# Patient Record
Sex: Female | Born: 1950 | Race: White | Hispanic: No | Marital: Married | Smoking: Former smoker
Health system: Southern US, Community
[De-identification: ages and names within clinical notes are randomized; demographics above are authoritative.]

## PROBLEM LIST (undated history)

## (undated) DIAGNOSIS — F329 Major depressive disorder, single episode, unspecified: Secondary | ICD-10-CM

## (undated) DIAGNOSIS — Z803 Family history of malignant neoplasm of breast: Secondary | ICD-10-CM

## (undated) DIAGNOSIS — Z923 Personal history of irradiation: Secondary | ICD-10-CM

## (undated) DIAGNOSIS — C801 Malignant (primary) neoplasm, unspecified: Secondary | ICD-10-CM

## (undated) DIAGNOSIS — K5090 Crohn's disease, unspecified, without complications: Secondary | ICD-10-CM

## (undated) DIAGNOSIS — E7800 Pure hypercholesterolemia, unspecified: Secondary | ICD-10-CM

## (undated) DIAGNOSIS — F32A Depression, unspecified: Secondary | ICD-10-CM

## (undated) HISTORY — DX: Family history of malignant neoplasm of breast: Z80.3

## (undated) HISTORY — DX: Crohn's disease, unspecified, without complications: K50.90

## (undated) HISTORY — PX: COLONOSCOPY: SHX174

## (undated) HISTORY — PX: OTHER SURGICAL HISTORY: SHX169

---

## 1999-02-08 ENCOUNTER — Encounter: Admission: RE | Admit: 1999-02-08 | Discharge: 1999-02-08 | Payer: Self-pay | Admitting: Obstetrics and Gynecology

## 1999-02-08 ENCOUNTER — Encounter: Payer: Self-pay | Admitting: Obstetrics and Gynecology

## 2000-02-07 ENCOUNTER — Other Ambulatory Visit: Admission: RE | Admit: 2000-02-07 | Discharge: 2000-02-07 | Payer: Self-pay | Admitting: Obstetrics and Gynecology

## 2000-10-29 ENCOUNTER — Encounter: Payer: Self-pay | Admitting: Obstetrics and Gynecology

## 2000-10-29 ENCOUNTER — Encounter: Admission: RE | Admit: 2000-10-29 | Discharge: 2000-10-29 | Payer: Self-pay | Admitting: Obstetrics and Gynecology

## 2013-03-10 ENCOUNTER — Other Ambulatory Visit: Payer: Self-pay | Admitting: Orthopedic Surgery

## 2013-03-10 NOTE — Progress Notes (Signed)
Surgery on 03/23/13.  proep on 03/21/13 at 100pm.  Need orders in EPIC.  Thank You.

## 2013-03-11 ENCOUNTER — Other Ambulatory Visit: Payer: Self-pay | Admitting: Orthopedic Surgery

## 2013-03-16 ENCOUNTER — Encounter (HOSPITAL_COMMUNITY): Payer: Self-pay | Admitting: Pharmacy Technician

## 2013-03-18 ENCOUNTER — Other Ambulatory Visit: Payer: Self-pay | Admitting: Orthopedic Surgery

## 2013-03-18 NOTE — H&P (Signed)
Holly Best is an 63 y.o. female.   Chief Complaint: back and leg pain HPI: The patient is a 63 year old female who presents today for follow up of their back. The patient is being followed for their low back symptoms. They are now 6 week(s) out from when symptoms began. Symptoms reported today include: pain. Current treatment includes: relative rest, activity modification, NSAIDs and pain medications. The following medication has been used for pain control: Percocet and naprosyn. The patient presents today following MRI.  No past medical history on file.  No past surgical history on file.  No family history on file. Social History:  has no tobacco, alcohol, and drug history on file.  Allergies: No Known Allergies   (Not in a hospital admission)  No results found for this or any previous visit (from the past 48 hour(s)). No results found.  Review of Systems  Constitutional: Negative.   HENT: Negative.   Eyes: Negative.   Respiratory: Negative.   Cardiovascular: Negative.   Gastrointestinal: Negative.   Genitourinary: Negative.   Musculoskeletal: Positive for back pain.  Skin: Negative.   Neurological: Negative.   Psychiatric/Behavioral: Negative.     There were no vitals taken for this visit. Physical Exam  Constitutional: She is oriented to person, place, and time. She appears well-developed and well-nourished.  HENT:  Head: Normocephalic and atraumatic.  Eyes: Conjunctivae and EOM are normal. Pupils are equal, round, and reactive to light.  Neck: Normal range of motion. Neck supple.  Cardiovascular: Normal rate and regular rhythm.   Respiratory: Effort normal and breath sounds normal.  GI: Soft. Bowel sounds are normal.  Musculoskeletal:  On exam, she's in moderate distress. Mood and affect appropriate. SLR produces buttock and thigh pain on the left, negative on the right. Trace EHL weakness is noted. Pain with extension and flexion of the lumbar  spine.  Lumbar spine exam reveals no evidence of soft tissue swelling, ecchymosis or deformity. The abdomen is soft and nontender. Nontender over the trochanters. No cellulitis or lymphadenopathy.  Motor is 5/5 including tibialis anterior, plantar flexion, quadriceps and hamstrings. Patient is normoreflexic. There is no Babinski or clonus. Sensory exam is intact to light touch. The patient has good distal pulses. No DVT. No pain and normal range of motion without instability of the hips, knees and ankles.  Neurological: She is alert and oriented to person, place, and time. She has normal reflexes.  Skin: Skin is warm and dry.  Psychiatric: She has a normal mood and affect.    MRI demonstrates lateral recess stenosis at 4-5 secondary to facet hypertrophy, a small disc protrusion centrally at 3-4 and at 2-3.  X-rays of the lumbar spine show minimal scoliosis. Hips are unremarkable.  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  The patient and her husband called in. She was having a lot of pain down her left leg and into the top of the foot and weakness. They want to forego the epidural and proceed with decompression. I re-reviewed her MRI. She does have facet hypertrophy and significant lateral recess stenosis at 4-5 effecting the 5 root. She is weak on exam with neurotension signs. It is reasonable to proceed and forego that. We discussed lumbar decompression. She will stop by the office for a handout that discusses the risks and benefits, she will review that. She will remain out of work. We discussed this for buttock and  leg pain only, not back pain and she understands. We had a good discussion concerning this. We will forego epidural and proceed with decompression.  I had an extensive discussion of the risks and benefits of the lumbar decompression with the  patient including bleeding, infection, damage to neurovascular structures, epidural fibrosis, CSF leak requiring repair. We also discussed increase in pain, adjacent segment disease, recurrent disc herniation, need for future surgery including repeat decompression and/or fusion. We also discussed risks of postoperative hematoma, paralysis, anesthetic complications including DVT, PE, death, cardiopulmonary dysfunction. In addition, the perioperative and postoperative courses were discussed in detail including the rehabilitative time and return to functional activity and work. I provided the patient with an illustrated handout and utilized the appropriate surgical models.   Plan microlumbar decompression L4-5 left  BISSELL, JACLYN M.  For Dr. Tonita Cong  03/18/2013, 2:45 PM

## 2013-03-21 ENCOUNTER — Ambulatory Visit (HOSPITAL_COMMUNITY)
Admission: RE | Admit: 2013-03-21 | Discharge: 2013-03-21 | Disposition: A | Discharge status: Home / Self Care | Payer: Federal, State, Local not specified - PPO | Source: Ambulatory Visit | Attending: Orthopedic Surgery | Admitting: Orthopedic Surgery

## 2013-03-21 ENCOUNTER — Encounter (HOSPITAL_COMMUNITY): Payer: Self-pay

## 2013-03-21 ENCOUNTER — Encounter (HOSPITAL_COMMUNITY)
Admission: RE | Admit: 2013-03-21 | Discharge: 2013-03-21 | Disposition: A | Discharge status: Home / Self Care | Payer: Federal, State, Local not specified - PPO | Source: Ambulatory Visit | Attending: Specialist | Admitting: Specialist

## 2013-03-21 DIAGNOSIS — M4120 Other idiopathic scoliosis, site unspecified: Secondary | ICD-10-CM | POA: Insufficient documentation

## 2013-03-21 DIAGNOSIS — Z01818 Encounter for other preprocedural examination: Secondary | ICD-10-CM | POA: Insufficient documentation

## 2013-03-21 DIAGNOSIS — M51379 Other intervertebral disc degeneration, lumbosacral region without mention of lumbar back pain or lower extremity pain: Secondary | ICD-10-CM | POA: Insufficient documentation

## 2013-03-21 DIAGNOSIS — M5137 Other intervertebral disc degeneration, lumbosacral region: Secondary | ICD-10-CM | POA: Insufficient documentation

## 2013-03-21 DIAGNOSIS — Z01812 Encounter for preprocedural laboratory examination: Secondary | ICD-10-CM | POA: Insufficient documentation

## 2013-03-21 LAB — BASIC METABOLIC PANEL
BUN: 15 mg/dL (ref 6–23)
CALCIUM: 9.4 mg/dL (ref 8.4–10.5)
CO2: 26 mEq/L (ref 19–32)
Chloride: 102 mEq/L (ref 96–112)
Creatinine, Ser: 0.81 mg/dL (ref 0.50–1.10)
GFR, EST AFRICAN AMERICAN: 88 mL/min — AB (ref >90)
GFR, EST NON AFRICAN AMERICAN: 76 mL/min — AB (ref >90)
Glucose, Bld: 105 mg/dL — ABNORMAL HIGH (ref 70–99)
Potassium: 4.4 mEq/L (ref 3.7–5.3)
SODIUM: 139 meq/L (ref 137–147)

## 2013-03-21 LAB — CBC
HCT: 37.8 % (ref 36.0–46.0)
Hemoglobin: 12.6 g/dL (ref 12.0–15.0)
MCH: 31.3 pg (ref 26.0–34.0)
MCHC: 33.3 g/dL (ref 30.0–36.0)
MCV: 93.8 fL (ref 78.0–100.0)
PLATELETS: 306 10*3/uL (ref 150–400)
RBC: 4.03 MIL/uL (ref 3.87–5.11)
RDW: 12.9 % (ref 11.5–15.5)
WBC: 5.7 10*3/uL (ref 4.0–10.5)

## 2013-03-21 LAB — SURGICAL PCR SCREEN
MRSA, PCR: NEGATIVE
STAPHYLOCOCCUS AUREUS: POSITIVE — AB

## 2013-03-21 NOTE — Patient Instructions (Signed)
20     Your procedure is scheduled on:  Wednesday 03/23/2013  Report to [facility] at 0830 AM.  Call this number if you have problems the night before or morning of surgery:  [phone]   Remember:             IF YOU USE CPAP,BRING MASK AND TUBING AM OF SURGERY!             IF YOU DO NOT HAVE YOUR TYPE AND SCREEN DRAWN AT PRE-ADMIT APPOINTMENT, YOU WILL HAVE IT DRAWN AM OF SURGERY!   Do not eat food or drink liquids AFTER MIDNIGHT!  Take these medicines the morning of surgery with A SIP OF WATER: Paxil    [facility] IS NOT RESPONSIBLE FOR ANY BELONGINGS OR VALUABLES BROUGHT TO HOSPITAL.  Marland Kitchen  Leave suitcase in the car. After surgery it may be brought to your room.  For patients admitted to the hospital, checkout time is 11:00 AM the day of              Discharge.    DO NOT WEAR JEWELRY,MAKE-UP,LOTIONS,POWDERS,PERFUMES,CONTACTS , DENTURES OR BRIDGEWORK ,AND DO NOT WEAR FALSE EYELASHES                                    Patients discharged the day of surgery will not be allowed to drive home. If going home the same day of surgery, must have someone stay with you  first 24 hrs.at home and arrange for someone to drive you home from the [place] IS:N/A   Special Instructions:              Please read over the following fact sheets that you were given:             1. [place].Cande Mastropietro,RN,BSN     [phone]                FAILURE TO FOLLOW THESE INSTRUCTIONS MAY RESULT IN   CANCELLATION OF YOUR SURGERY!               Patient Signature:___________________________

## 2013-03-23 ENCOUNTER — Ambulatory Visit (HOSPITAL_COMMUNITY): Payer: Federal, State, Local not specified - PPO

## 2013-03-23 ENCOUNTER — Encounter (HOSPITAL_COMMUNITY): Payer: Self-pay | Admitting: *Deleted

## 2013-03-23 ENCOUNTER — Ambulatory Visit (HOSPITAL_COMMUNITY)
Admission: RE | Admit: 2013-03-23 | Discharge: 2013-03-24 | Disposition: A | Discharge status: Home / Self Care | Payer: Federal, State, Local not specified - PPO | Source: Ambulatory Visit | Attending: Specialist | Admitting: Specialist

## 2013-03-23 ENCOUNTER — Encounter (HOSPITAL_COMMUNITY): Payer: Federal, State, Local not specified - PPO | Admitting: Certified Registered Nurse Anesthetist

## 2013-03-23 ENCOUNTER — Encounter (HOSPITAL_COMMUNITY)
Admission: RE | Disposition: A | Discharge status: Home / Self Care | Payer: Self-pay | Source: Ambulatory Visit | Attending: Specialist

## 2013-03-23 ENCOUNTER — Ambulatory Visit (HOSPITAL_COMMUNITY): Payer: Federal, State, Local not specified - PPO | Admitting: Certified Registered Nurse Anesthetist

## 2013-03-23 DIAGNOSIS — M48062 Spinal stenosis, lumbar region with neurogenic claudication: Secondary | ICD-10-CM | POA: Insufficient documentation

## 2013-03-23 DIAGNOSIS — Z79899 Other long term (current) drug therapy: Secondary | ICD-10-CM | POA: Insufficient documentation

## 2013-03-23 DIAGNOSIS — E7800 Pure hypercholesterolemia, unspecified: Secondary | ICD-10-CM | POA: Insufficient documentation

## 2013-03-23 DIAGNOSIS — Z791 Long term (current) use of non-steroidal anti-inflammatories (NSAID): Secondary | ICD-10-CM | POA: Insufficient documentation

## 2013-03-23 DIAGNOSIS — Z87891 Personal history of nicotine dependence: Secondary | ICD-10-CM | POA: Insufficient documentation

## 2013-03-23 HISTORY — DX: Depression, unspecified: F32.A

## 2013-03-23 HISTORY — PX: LUMBAR LAMINECTOMY: SHX95

## 2013-03-23 HISTORY — DX: Major depressive disorder, single episode, unspecified: F32.9

## 2013-03-23 HISTORY — DX: Pure hypercholesterolemia, unspecified: E78.00

## 2013-03-23 SURGERY — MICRODISCECTOMY LUMBAR LAMINECTOMY
Anesthesia: General | Site: Back | Laterality: Bilateral

## 2013-03-23 MED ORDER — HYDROCODONE-ACETAMINOPHEN 5-325 MG PO TABS: 1.0000 | ORAL_TABLET | ORAL | Status: DC | PRN

## 2013-03-23 MED ORDER — DOCUSATE SODIUM 100 MG PO CAPS
100.0000 mg | ORAL_CAPSULE | Freq: Two times a day (BID) | ORAL | Status: DC
  Administered 2013-03-23 – 2013-03-24 (×2): 100 mg via ORAL

## 2013-03-23 MED ORDER — HYDROMORPHONE HCL PF 1 MG/ML IJ SOLN: 0.2500 mg | INTRAMUSCULAR | Status: DC | PRN

## 2013-03-23 MED ORDER — LACTATED RINGERS IV SOLN: INTRAVENOUS | Status: DC

## 2013-03-23 MED ORDER — GLYCOPYRROLATE 0.2 MG/ML IJ SOLN
INTRAMUSCULAR | Status: AC
  Filled 2013-03-23: qty 3

## 2013-03-23 MED ORDER — SODIUM CHLORIDE 0.9 % IJ SOLN: 3.0000 mL | INTRAMUSCULAR | Status: DC | PRN

## 2013-03-23 MED ORDER — PHENYLEPHRINE 40 MCG/ML (10ML) SYRINGE FOR IV PUSH (FOR BLOOD PRESSURE SUPPORT)
PREFILLED_SYRINGE | INTRAVENOUS | Status: AC
  Filled 2013-03-23: qty 10

## 2013-03-23 MED ORDER — PAROXETINE HCL 20 MG PO TABS
20.0000 mg | ORAL_TABLET | Freq: Every evening | ORAL | Status: DC
  Administered 2013-03-23: 20 mg via ORAL
  Filled 2013-03-23 (×2): qty 1

## 2013-03-23 MED ORDER — ACETAMINOPHEN 650 MG RE SUPP: 650.0000 mg | RECTAL | Status: DC | PRN

## 2013-03-23 MED ORDER — PHENOL 1.4 % MT LIQD: 1.0000 | OROMUCOSAL | Status: DC | PRN

## 2013-03-23 MED ORDER — FENTANYL CITRATE 0.05 MG/ML IJ SOLN
INTRAMUSCULAR | Status: AC
  Filled 2013-03-23: qty 5

## 2013-03-23 MED ORDER — MIDAZOLAM HCL 5 MG/5ML IJ SOLN
INTRAMUSCULAR | Status: DC | PRN
  Administered 2013-03-23: 2 mg via INTRAVENOUS

## 2013-03-23 MED ORDER — ROCURONIUM BROMIDE 100 MG/10ML IV SOLN
INTRAVENOUS | Status: DC | PRN
  Administered 2013-03-23: 40 mg via INTRAVENOUS

## 2013-03-23 MED ORDER — ACETAMINOPHEN 10 MG/ML IV SOLN
1000.0000 mg | Freq: Once | INTRAVENOUS | Status: AC
  Administered 2013-03-23: 1000 mg via INTRAVENOUS
  Filled 2013-03-23: qty 100

## 2013-03-23 MED ORDER — ACETAMINOPHEN 325 MG PO TABS: 650.0000 mg | ORAL_TABLET | ORAL | Status: DC | PRN

## 2013-03-23 MED ORDER — METHOCARBAMOL 500 MG PO TABS
500.0000 mg | ORAL_TABLET | Freq: Four times a day (QID) | ORAL | Status: DC | PRN
Start: 2013-03-23 — End: 2013-03-24
  Administered 2013-03-23 – 2013-03-24 (×3): 500 mg via ORAL
  Filled 2013-03-23 (×3): qty 1

## 2013-03-23 MED ORDER — LACTATED RINGERS IV SOLN
INTRAVENOUS | Status: DC
  Administered 2013-03-23 (×3): via INTRAVENOUS

## 2013-03-23 MED ORDER — OXYCODONE-ACETAMINOPHEN 5-325 MG PO TABS
1.0000 | ORAL_TABLET | ORAL | Status: DC | PRN
  Administered 2013-03-23 (×2): 2 via ORAL
  Administered 2013-03-23: 1 via ORAL
  Administered 2013-03-24 (×2): 2 via ORAL
  Filled 2013-03-23: qty 1
  Filled 2013-03-23 (×4): qty 2

## 2013-03-23 MED ORDER — HYDROMORPHONE HCL PF 1 MG/ML IJ SOLN
0.5000 mg | INTRAMUSCULAR | Status: DC | PRN
  Administered 2013-03-23 – 2013-03-24 (×2): 1 mg via INTRAVENOUS
  Filled 2013-03-23 (×2): qty 1

## 2013-03-23 MED ORDER — LORAZEPAM 1 MG PO TABS
1.0000 mg | ORAL_TABLET | Freq: Every day | ORAL | Status: DC
  Administered 2013-03-23: 1 mg via ORAL
  Filled 2013-03-23: qty 1

## 2013-03-23 MED ORDER — PROMETHAZINE HCL 25 MG/ML IJ SOLN: 6.2500 mg | INTRAMUSCULAR | Status: DC | PRN

## 2013-03-23 MED ORDER — OXYCODONE-ACETAMINOPHEN 7.5-325 MG PO TABS: 1.0000 | ORAL_TABLET | ORAL | Status: DC | PRN

## 2013-03-23 MED ORDER — ONDANSETRON HCL 4 MG/2ML IJ SOLN
INTRAMUSCULAR | Status: DC | PRN
  Administered 2013-03-23: 4 mg via INTRAVENOUS

## 2013-03-23 MED ORDER — EPHEDRINE SULFATE 50 MG/ML IJ SOLN
INTRAMUSCULAR | Status: DC | PRN
  Administered 2013-03-23 (×4): 5 mg via INTRAVENOUS

## 2013-03-23 MED ORDER — SODIUM CHLORIDE 0.9 % IR SOLN
Status: DC | PRN
  Administered 2013-03-23: 12:00:00

## 2013-03-23 MED ORDER — BUPIVACAINE-EPINEPHRINE 0.5% -1:200000 IJ SOLN
INTRAMUSCULAR | Status: DC | PRN
  Administered 2013-03-23: 13 mL

## 2013-03-23 MED ORDER — THROMBIN 5000 UNITS EX SOLR
OROMUCOSAL | Status: DC | PRN
  Administered 2013-03-23: 12:00:00 via TOPICAL

## 2013-03-23 MED ORDER — LIDOCAINE HCL (CARDIAC) 20 MG/ML IV SOLN
INTRAVENOUS | Status: DC | PRN
  Administered 2013-03-23: 70 mg via INTRAVENOUS

## 2013-03-23 MED ORDER — SODIUM CHLORIDE 0.9 % IJ SOLN
INTRAMUSCULAR | Status: AC
  Filled 2013-03-23: qty 10

## 2013-03-23 MED ORDER — EZETIMIBE 10 MG PO TABS
10.0000 mg | ORAL_TABLET | Freq: Every day | ORAL | Status: DC
  Administered 2013-03-23: 10 mg via ORAL
  Filled 2013-03-23 (×2): qty 1

## 2013-03-23 MED ORDER — HYDROMORPHONE HCL PF 1 MG/ML IJ SOLN
INTRAMUSCULAR | Status: DC | PRN
  Administered 2013-03-23 (×2): 0.5 mg via INTRAVENOUS

## 2013-03-23 MED ORDER — ONDANSETRON HCL 4 MG/2ML IJ SOLN: 4.0000 mg | INTRAMUSCULAR | Status: DC | PRN

## 2013-03-23 MED ORDER — CEFAZOLIN SODIUM-DEXTROSE 2-3 GM-% IV SOLR
2.0000 g | INTRAVENOUS | Status: AC
  Administered 2013-03-23: 2 g via INTRAVENOUS

## 2013-03-23 MED ORDER — PROPOFOL 10 MG/ML IV BOLUS
INTRAVENOUS | Status: AC
  Filled 2013-03-23: qty 20

## 2013-03-23 MED ORDER — ESMOLOL HCL 10 MG/ML IV SOLN
INTRAVENOUS | Status: DC | PRN
  Administered 2013-03-23: 30 mg via INTRAVENOUS

## 2013-03-23 MED ORDER — CEFAZOLIN SODIUM-DEXTROSE 2-3 GM-% IV SOLR
2.0000 g | Freq: Three times a day (TID) | INTRAVENOUS | Status: AC
  Administered 2013-03-23 – 2013-03-24 (×2): 2 g via INTRAVENOUS
  Filled 2013-03-23 (×2): qty 50

## 2013-03-23 MED ORDER — PROPOFOL 10 MG/ML IV BOLUS
INTRAVENOUS | Status: DC | PRN
  Administered 2013-03-23: 170 mg via INTRAVENOUS

## 2013-03-23 MED ORDER — ROCURONIUM BROMIDE 100 MG/10ML IV SOLN
INTRAVENOUS | Status: AC
  Filled 2013-03-23: qty 1

## 2013-03-23 MED ORDER — LIDOCAINE HCL (CARDIAC) 20 MG/ML IV SOLN
INTRAVENOUS | Status: AC
  Filled 2013-03-23: qty 5

## 2013-03-23 MED ORDER — MEPERIDINE HCL 50 MG/ML IJ SOLN: 6.2500 mg | INTRAMUSCULAR | Status: DC | PRN

## 2013-03-23 MED ORDER — SODIUM CHLORIDE 0.9 % IJ SOLN: 3.0000 mL | Freq: Two times a day (BID) | INTRAMUSCULAR | Status: DC

## 2013-03-23 MED ORDER — THROMBIN 5000 UNITS EX SOLR
CUTANEOUS | Status: AC
  Filled 2013-03-23: qty 10000

## 2013-03-23 MED ORDER — NEOSTIGMINE METHYLSULFATE 1 MG/ML IJ SOLN
INTRAMUSCULAR | Status: AC
  Filled 2013-03-23: qty 10

## 2013-03-23 MED ORDER — LACTATED RINGERS IV SOLN
INTRAVENOUS | Status: DC
  Administered 2013-03-23: 1000 mL via INTRAVENOUS

## 2013-03-23 MED ORDER — FENTANYL CITRATE 0.05 MG/ML IJ SOLN
INTRAMUSCULAR | Status: DC | PRN
  Administered 2013-03-23: 100 ug via INTRAVENOUS
  Administered 2013-03-23 (×3): 50 ug via INTRAVENOUS

## 2013-03-23 MED ORDER — MIDAZOLAM HCL 2 MG/2ML IJ SOLN
INTRAMUSCULAR | Status: AC
  Filled 2013-03-23: qty 2

## 2013-03-23 MED ORDER — NEOSTIGMINE METHYLSULFATE 1 MG/ML IJ SOLN
INTRAMUSCULAR | Status: DC | PRN
  Administered 2013-03-23: 4 mg via INTRAVENOUS

## 2013-03-23 MED ORDER — SODIUM CHLORIDE 0.45 % IV SOLN
INTRAVENOUS | Status: DC
  Administered 2013-03-23: 50 mL/h via INTRAVENOUS

## 2013-03-23 MED ORDER — THROMBIN 5000 UNITS EX SOLR
CUTANEOUS | Status: DC | PRN
  Administered 2013-03-23: 12:00:00 via TOPICAL

## 2013-03-23 MED ORDER — METHOCARBAMOL 500 MG PO TABS: 500.0000 mg | ORAL_TABLET | Freq: Three times a day (TID) | ORAL | Status: DC

## 2013-03-23 MED ORDER — MENTHOL 3 MG MT LOZG
1.0000 | LOZENGE | OROMUCOSAL | Status: DC | PRN
  Filled 2013-03-23 (×2): qty 9

## 2013-03-23 MED ORDER — CEFAZOLIN SODIUM-DEXTROSE 2-3 GM-% IV SOLR
INTRAVENOUS | Status: AC
  Filled 2013-03-23: qty 50

## 2013-03-23 MED ORDER — SODIUM CHLORIDE 0.9 % IV SOLN: 250.0000 mL | INTRAVENOUS | Status: DC

## 2013-03-23 MED ORDER — GLYCOPYRROLATE 0.2 MG/ML IJ SOLN
INTRAMUSCULAR | Status: DC | PRN
  Administered 2013-03-23: 0.6 mg via INTRAVENOUS

## 2013-03-23 MED ORDER — HYDROMORPHONE HCL PF 2 MG/ML IJ SOLN
INTRAMUSCULAR | Status: AC
  Filled 2013-03-23: qty 1

## 2013-03-23 MED ORDER — EPHEDRINE SULFATE 50 MG/ML IJ SOLN
INTRAMUSCULAR | Status: AC
  Filled 2013-03-23: qty 1

## 2013-03-23 MED ORDER — ONDANSETRON HCL 4 MG/2ML IJ SOLN
INTRAMUSCULAR | Status: AC
  Filled 2013-03-23: qty 2

## 2013-03-23 MED ORDER — BUPIVACAINE-EPINEPHRINE PF 0.5-1:200000 % IJ SOLN
INTRAMUSCULAR | Status: AC
  Filled 2013-03-23: qty 30

## 2013-03-23 MED ORDER — METHOCARBAMOL 100 MG/ML IJ SOLN
500.0000 mg | Freq: Four times a day (QID) | INTRAVENOUS | Status: DC | PRN
  Administered 2013-03-23: 500 mg via INTRAVENOUS
  Filled 2013-03-23: qty 5

## 2013-03-23 MED ORDER — DEXAMETHASONE SODIUM PHOSPHATE 10 MG/ML IJ SOLN
INTRAMUSCULAR | Status: DC | PRN
  Administered 2013-03-23: 10 mg via INTRAVENOUS

## 2013-03-23 MED ORDER — ESMOLOL HCL 10 MG/ML IV SOLN
INTRAVENOUS | Status: AC
  Filled 2013-03-23: qty 10

## 2013-03-23 SURGICAL SUPPLY — 46 items
BAG SPEC THK2 15X12 ZIP CLS (MISCELLANEOUS) ×1
BAG ZIPLOCK 12X15 (MISCELLANEOUS) ×3 IMPLANT
CHLORAPREP W/TINT 26ML (MISCELLANEOUS) IMPLANT
CLEANER TIP ELECTROSURG 2X2 (MISCELLANEOUS) ×3 IMPLANT
CLOSURE WOUND 1/2 X4 (GAUZE/BANDAGES/DRESSINGS) ×1
CLOTH 2% CHLOROHEXIDINE 3PK (PERSONAL CARE ITEMS) ×3 IMPLANT
DRAPE MICROSCOPE LEICA (MISCELLANEOUS) ×3 IMPLANT
DRAPE POUCH INSTRU U-SHP 10X18 (DRAPES) ×3 IMPLANT
DRAPE SURG 17X11 SM STRL (DRAPES) ×3 IMPLANT
DRAPE UTILITY XL STRL (DRAPES) ×3 IMPLANT
DRSG AQUACEL AG ADV 3.5X 4 (GAUZE/BANDAGES/DRESSINGS) ×2 IMPLANT
DRSG AQUACEL AG ADV 3.5X 6 (GAUZE/BANDAGES/DRESSINGS) IMPLANT
DURAPREP 26ML APPLICATOR (WOUND CARE) ×3 IMPLANT
DURASEAL SPINE SEALANT 3ML (MISCELLANEOUS) ×3 IMPLANT
ELECT REM PT RETURN 9FT ADLT (ELECTROSURGICAL) ×3
ELECTRODE REM PT RTRN 9FT ADLT (ELECTROSURGICAL) ×1 IMPLANT
GLOVE BIOGEL PI IND STRL 7.5 (GLOVE) ×1 IMPLANT
GLOVE BIOGEL PI INDICATOR 7.5 (GLOVE) ×2
GLOVE SURG SS PI 7.5 STRL IVOR (GLOVE) ×3 IMPLANT
GLOVE SURG SS PI 8.0 STRL IVOR (GLOVE) ×6 IMPLANT
GOWN STRL REUS W/TWL XL LVL3 (GOWN DISPOSABLE) ×9 IMPLANT
IV CATH 14GX2 1/4 (CATHETERS) ×1 IMPLANT
KIT BASIN OR (CUSTOM PROCEDURE TRAY) ×3 IMPLANT
KIT POSITIONING SURG ANDREWS (MISCELLANEOUS) ×3 IMPLANT
MANIFOLD NEPTUNE II (INSTRUMENTS) ×3 IMPLANT
NDL SPNL 18GX3.5 QUINCKE PK (NEEDLE) ×3 IMPLANT
NEEDLE SPNL 18GX3.5 QUINCKE PK (NEEDLE) ×6 IMPLANT
PATTIES SURGICAL .5 X.5 (GAUZE/BANDAGES/DRESSINGS) IMPLANT
PATTIES SURGICAL .75X.75 (GAUZE/BANDAGES/DRESSINGS) IMPLANT
PATTIES SURGICAL 1X1 (DISPOSABLE) IMPLANT
SPONGE SURGIFOAM ABS GEL 100 (HEMOSTASIS) ×3 IMPLANT
STAPLER VISISTAT (STAPLE) IMPLANT
STRIP CLOSURE SKIN 1/2X4 (GAUZE/BANDAGES/DRESSINGS) ×1 IMPLANT
SUT NURALON 4 0 TR CR/8 (SUTURE) ×1 IMPLANT
SUT PROLENE 3 0 PS 2 (SUTURE) ×2 IMPLANT
SUT VIC AB 0 CT1 27 (SUTURE)
SUT VIC AB 0 CT1 27XBRD ANTBC (SUTURE) IMPLANT
SUT VIC AB 1-0 CT2 27 (SUTURE) ×4 IMPLANT
SUT VIC AB 2-0 CT1 27 (SUTURE)
SUT VIC AB 2-0 CT1 TAPERPNT 27 (SUTURE) ×1 IMPLANT
SUT VICRYL 0 UR6 27IN ABS (SUTURE) IMPLANT
SYR 3ML LL SCALE MARK (SYRINGE) ×1 IMPLANT
SYR CONTROL 10ML LL (SYRINGE) IMPLANT
TRAY FOLEY CATH 14FRSI W/METER (CATHETERS) ×2 IMPLANT
TRAY LAMINECTOMY (CUSTOM PROCEDURE TRAY) ×3 IMPLANT
YANKAUER SUCT BULB TIP NO VENT (SUCTIONS) ×3 IMPLANT

## 2013-03-23 NOTE — Brief Op Note (Signed)
03/23/2013  12:25 PM  PATIENT:  Holly Best  63 y.o. 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  PROCEDURE:  Procedure(s): MICRO LUMBAR DECOMPRESSION, FORAMINOTOMY L4-5 (Bilateral)  SURGEON:  Surgeon(s) and Role:    * Johnn Hai, MD - Primary  PHYSICIAN ASSISTANT:   ASSISTANTS: Bissell   ANESTHESIA:   general  EBL:  Total I/O In: 1000 [I.V.:1000] Out: 500 [Urine:450; Blood:50]  BLOOD ADMINISTERED:none  DRAINS: none   LOCAL MEDICATIONS USED:  MARCAINE     SPECIMEN:  No Specimen  DISPOSITION OF SPECIMEN:  N/A  COUNTS:  YES  TOURNIQUET:  * No tourniquets in log *  DICTATION: .Other Dictation: Dictation Number [id number]  PLAN OF CARE: Admit for overnight observation  PATIENT DISPOSITION:  PACU - hemodynamically stable.   Delay start of Pharmacological VTE agent (>24hrs) due to surgical blood loss or risk of bleeding: no

## 2013-03-23 NOTE — Interval H&P Note (Signed)
History and Physical Interval Note:  03/23/2013 10:15 AM  Holly Best  has presented today for surgery, with the diagnosis of H&P L4-5 LEFT  The various methods of treatment have been discussed with the patient and family. After consideration of risks, benefits and other options for treatment, the patient has consented to  Procedure(s): MICRO LUMBER DECOMPRESSION L4-5 ON LEFT (Left) as a surgical intervention .  The patient's history has been reviewed, patient examined, no change in status, stable for surgery.  I have reviewed the patient's chart and labs.  Questions were answered to the patient's satisfaction.     [name] C

## 2013-03-23 NOTE — Preoperative (Signed)
Beta Blockers   Reason not to administer Beta Blockers:Not Applicable 

## 2013-03-23 NOTE — Op Note (Signed)
NAMEAIGNER, HORSEMAN NO.:  [id number]  MEDICAL RECORD NO.:  [id number]  LOCATION:  63                         FACILITY:  [facility]  PHYSICIAN:  Susa Day, M.D.    DATE OF BIRTH:  1950/10/09  DATE OF PROCEDURE:  03/23/2013 DATE OF DISCHARGE:                              OPERATIVE REPORT   PREOPERATIVE DIAGNOSIS:  Spinal stenosis at L4-5.  POSTOPERATIVE DIAGNOSIS:  Spinal stenosis at L4-5.  PROCEDURE PERFORMED:  Bilateral hemilaminotomies with foraminotomies of L4 and L5 bilaterally.  ANESTHESIA:  General.  ASSISTANT:  Cleophas Dunker, PA.  HISTORY:  A 63 year old, predominantly left lower extremity radicular pain, L5 nerve root distribution, myotomal weakness, dermatomal dysesthesias.  She at the time of surgery developed some right-sided pain as well.  It was felt that central decompression as well would be required, to be able to adequately decompress the lateral recess at L4-5 as well as decompress the 5 root.  Facet hypertrophy was noted.  Mild scoliosis.  No subluxation.  Risk and benefits were discussed including bleeding, infection, damage to neurovascular structures, DVT, PE, anesthetic complications, etc.  TECHNIQUE:  With the patient in supine position, after induction of adequate general anesthesia and 2 g of Kefzol, placed prone on the [place] frame.  All bony prominences were well padded.  Lumbar region was prepped and draped in usual sterile fashion.  Two 18-gauge spinal needles were utilized to localize the 4-5 interspace, confirmed with x- ray.  Incision was made from the spinous process at 4-5.  Subcutaneous tissue was dissected.  Electrocautery was utilized to achieve hemostasis.  Confirmatory radiograph obtained.  Paraspinous muscle was elevated from lamina at 4-5.  McCullough retractor was placed.  There was no interlaminar window on the left.  I therefore removed part of the spinous process of 4 and 5, the hemilamina of 4  bilaterally.  Ligamentum flavum and hypertrophy were noted.  We used a curette to detach the ligamentum flavum from the cephalad edge of 5 bilaterally.  We then removed the hypertrophic ligamentum starting centrally.  We placed a neuro patty beneath the ligamentum flavum.  We again decompressed the both sides, left and right.  We decompressed the lateral recess to the medial border of the pedicle after performing foraminotomies of L5 bilaterally, particularly stenotic on the left and the foramen of 4, particularly stenotic with adhesions and ligamentum flavum hypertrophy, this was meticulously removed from both sides.  This was meticulously removed using the 2-mm Kerrison from both sides of the operating room table.  No disk herniation was noted bilaterally.  Bone wax, thrombin- soaked Gelfoam and bipolar electrocautery was utilized to achieve hemostasis.  Good restoration of the thecal sac.  Confirmatory radiograph obtained with neuroprobe in the foramen of 4 and 5.  We were able to pass a neuroprobe cephalad of the pedicle of 4 without compression and through the 5 root distally.  We felt this decompressed both sides.  She did have ligamentum flavum hypertrophy bilaterally and we undercut the facets bilaterally as well.  Wound was copiously irrigated.  The dorsal fascia was closed with 1 Vicryl interrupted, subcu with 2, skin with 4-0 subcuticular Prolene.  Wound reinforced with Steri-Strips.  Sterile dressing  was applied.  Placed supine on the hospital bed, extubated without difficulty, and transported to the recovery room in satisfactory condition.  The patient tolerated the procedure well.  No complications.  Assistant, Cleophas Dunker, PA was used for positioning of the patient, gentle intermittent neural traction, suction, etc.  Minimal blood loss.     Susa Day, M.D.     Geralynn Rile  D:  03/23/2013  T:  03/23/2013  Job:  [id number]

## 2013-03-23 NOTE — Transfer of Care (Signed)
Immediate Anesthesia Transfer of Care Note  Patient: Holly Best  Procedure(s) Performed: Procedure(s) (LRB): MICRO LUMBAR DECOMPRESSION, FORAMINOTOMY L4-5 (Bilateral)  Patient Location: PACU  Anesthesia Type: General  Level of Consciousness: sedated, patient cooperative and responds to stimulation  Airway & Oxygen Therapy: Patient Spontanous Breathing and Patient connected to face mask oxgen  Post-op Assessment: Report given to PACU RN and Post -op Vital signs reviewed and stable  Post vital signs: Reviewed and stable  Complications: No apparent anesthesia complications

## 2013-03-23 NOTE — Anesthesia Preprocedure Evaluation (Addendum)
Anesthesia Evaluation  Patient identified by MRN, date of birth, ID band Patient awake    Reviewed: Allergy & Precautions, H&P , NPO status , Patient's Chart, lab work & pertinent test results  Airway Mallampati: II TM Distance: >3 FB Neck ROM: Full    Dental no notable dental hx.    Pulmonary neg pulmonary ROS, former smoker,  breath sounds clear to auscultation  Pulmonary exam normal       Cardiovascular negative cardio ROS  Rhythm:Regular Rate:Normal     Neuro/Psych negative neurological ROS  negative psych ROS   GI/Hepatic negative GI ROS, Neg liver ROS,   Endo/Other  negative endocrine ROS  Renal/GU negative Renal ROS  negative genitourinary   Musculoskeletal negative musculoskeletal ROS (+)   Abdominal   Peds negative pediatric ROS (+)  Hematology negative hematology ROS (+)   Anesthesia Other Findings Upper front caps  Reproductive/Obstetrics negative OB ROS                          Anesthesia Physical Anesthesia Plan  ASA: II  Anesthesia Plan: General   Post-op Pain Management:    Induction: Intravenous  Airway Management Planned: Oral ETT  Additional Equipment:   Intra-op Plan:   Post-operative Plan: Extubation in OR  Informed Consent: I have reviewed the patients History and Physical, chart, labs and discussed the procedure including the risks, benefits and alternatives for the proposed anesthesia with the patient or authorized representative who has indicated his/her understanding and acceptance.   Dental advisory given  Plan Discussed with: CRNA  Anesthesia Plan Comments:         Anesthesia Quick Evaluation

## 2013-03-23 NOTE — H&P (View-Only) (Signed)
Holly Best is an 63 y.o. female.   Chief Complaint: back and leg pain HPI: The patient is a 63 year old female who presents today for follow up of their back. The patient is being followed for their low back symptoms. They are now 6 week(s) out from when symptoms began. Symptoms reported today include: pain. Current treatment includes: relative rest, activity modification, NSAIDs and pain medications. The following medication has been used for pain control: Percocet and naprosyn. The patient presents today following MRI.  No past medical history on file.  No past surgical history on file.  No family history on file. Social History:  has no tobacco, alcohol, and drug history on file.  Allergies: No Known Allergies   (Not in a hospital admission)  No results found for this or any previous visit (from the past 48 hour(s)). No results found.  Review of Systems  Constitutional: Negative.   HENT: Negative.   Eyes: Negative.   Respiratory: Negative.   Cardiovascular: Negative.   Gastrointestinal: Negative.   Genitourinary: Negative.   Musculoskeletal: Positive for back pain.  Skin: Negative.   Neurological: Negative.   Psychiatric/Behavioral: Negative.     There were no vitals taken for this visit. Physical Exam  Constitutional: She is oriented to person, place, and time. She appears well-developed and well-nourished.  HENT:  Head: Normocephalic and atraumatic.  Eyes: Conjunctivae and EOM are normal. Pupils are equal, round, and reactive to light.  Neck: Normal range of motion. Neck supple.  Cardiovascular: Normal rate and regular rhythm.   Respiratory: Effort normal and breath sounds normal.  GI: Soft. Bowel sounds are normal.  Musculoskeletal:  On exam, she's in moderate distress. Mood and affect appropriate. SLR produces buttock and thigh pain on the left, negative on the right. Trace EHL weakness is noted. Pain with extension and flexion of the lumbar  spine.  Lumbar spine exam reveals no evidence of soft tissue swelling, ecchymosis or deformity. The abdomen is soft and nontender. Nontender over the trochanters. No cellulitis or lymphadenopathy.  Motor is 5/5 including tibialis anterior, plantar flexion, quadriceps and hamstrings. Patient is normoreflexic. There is no Babinski or clonus. Sensory exam is intact to light touch. The patient has good distal pulses. No DVT. No pain and normal range of motion without instability of the hips, knees and ankles.  Neurological: She is alert and oriented to person, place, and time. She has normal reflexes.  Skin: Skin is warm and dry.  Psychiatric: She has a normal mood and affect.    MRI demonstrates lateral recess stenosis at 4-5 secondary to facet hypertrophy, a small disc protrusion centrally at 3-4 and at 2-3.  X-rays of the lumbar spine show minimal scoliosis. Hips are unremarkable.  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  The patient and her husband called in. She was having a lot of pain down her left leg and into the top of the foot and weakness. They want to forego the epidural and proceed with decompression. I re-reviewed her MRI. She does have facet hypertrophy and significant lateral recess stenosis at 4-5 effecting the 5 root. She is weak on exam with neurotension signs. It is reasonable to proceed and forego that. We discussed lumbar decompression. She will stop by the office for a handout that discusses the risks and benefits, she will review that. She will remain out of work. We discussed this for buttock and  leg pain only, not back pain and she understands. We had a good discussion concerning this. We will forego epidural and proceed with decompression.  I had an extensive discussion of the risks and benefits of the lumbar decompression with the  patient including bleeding, infection, damage to neurovascular structures, epidural fibrosis, CSF leak requiring repair. We also discussed increase in pain, adjacent segment disease, recurrent disc herniation, need for future surgery including repeat decompression and/or fusion. We also discussed risks of postoperative hematoma, paralysis, anesthetic complications including DVT, PE, death, cardiopulmonary dysfunction. In addition, the perioperative and postoperative courses were discussed in detail including the rehabilitative time and return to functional activity and work. I provided the patient with an illustrated handout and utilized the appropriate surgical models.   Plan microlumbar decompression L4-5 left  Suhaila Troiano M.  For Dr. Tonita Cong  03/18/2013, 2:45 PM

## 2013-03-23 NOTE — Discharge Instructions (Signed)
Walk As Tolerated utilizing back precautions.  No bending, twisting, or lifting.  No driving for 2 weeks.   Aquacel dressing may remain in place for 7 days. May shower with aquacel dressing in place. After 7 days, remove aquacel dressing and place gauze and tape dressing which should be kept clean and dry and changed daily. Do not remove steri-strips if they are present. See Dr. Tonita Cong in office in 10 to 14 days. Begin taking aspirin 33m per day starting 4 days after your surgery if not allergic to aspirin or on another blood thinner. Walk daily even outside. Use a cane or walker only if necessary. Avoid sitting on soft sofas.

## 2013-03-24 ENCOUNTER — Encounter (HOSPITAL_COMMUNITY): Payer: Self-pay | Admitting: Specialist

## 2013-03-24 MED ORDER — DOCUSATE SODIUM 100 MG PO CAPS: 100.0000 mg | ORAL_CAPSULE | Freq: Two times a day (BID) | ORAL | Status: DC

## 2013-03-24 NOTE — Evaluation (Signed)
Occupational Therapy Evaluation Patient Details Name: Holly Best MRN: [id number] DOB: Feb 20, 1950 Today's Date: 03/24/2013 Time: 4098-1191 OT Time Calculation (min): 25 min  OT Assessment / Plan / Recommendation History of present illness back surgery per Dr Tonita Cong   Clinical Impression   Pt presents to OT s/p back surgery. Education complete regarding ADL activity s/p back surgery   OT Assessment  Patient does not need any further OT services    Follow Up Recommendations  No OT follow up       Equipment Recommendations  None recommended by OT          Precautions / Restrictions Precautions Precautions: Back       ADL  ADL Comments: Education complete regarding ADL activity s/p back surgery.  Husband will help as able. Pt will obtain a reacher for ADL activity. Pt overall S with ADL activity while adhering back precautions        Visit Information  Last OT Received On: 03/24/13 History of Present Illness: back surgery per Dr Tonita Cong       Prior Functioning     Home Living Family/patient expects to be discharged to:: Private residence Living Arrangements: Spouse/significant other Available Help at Discharge: Family Type of Home: House Home Equipment: Bedside commode Prior Function Level of Independence: Independent Communication Communication: No difficulties         Vision/Perception Vision - History Patient Visual Report: No change from baseline   Cognition  Cognition Arousal/Alertness: Awake/alert Behavior During Therapy: WFL for tasks assessed/performed Overall Cognitive Status: Within Functional Limits for tasks assessed    Extremity/Trunk Assessment Upper Extremity Assessment Upper Extremity Assessment: Overall WFL for tasks assessed     Mobility Transfers Overall transfer level: Needs assistance Transfers: Sit to/from Stand Sit to Stand: Supervision General transfer comment: verbal cues for hand placement and back precautions            End of Session OT - End of Session Activity Tolerance: Patient tolerated treatment well Patient left: in chair Nurse Communication: Mobility status  GO Functional Assessment Tool Used: clinical observation Functional Limitation: Self care Self Care Current Status (Y7829): At least 1 percent but less than 20 percent impaired, limited or restricted Self Care Goal Status (F6213): 0 percent impaired, limited or restricted Self Care Discharge Status [id number]): At least 1 percent but less than 20 percent impaired, limited or restricted   [place], Thereasa Parkin 03/24/2013, 9:49 AM

## 2013-03-24 NOTE — Progress Notes (Signed)
Subjective: 1 Day Post-Op Procedure(s) (LRB): MICRO LUMBAR DECOMPRESSION, FORAMINOTOMY L4-5 (Bilateral) Patient reports pain as mild.  Reports incisional back pain. Legs doing well. Has been OOB multiple times without difficulty. Foley removed, she is voiding without issues. She is ready to go home.  Objective: Vital signs in last 24 hours: Temp:  [97.6 F (36.4 C)-99 F (37.2 C)] 97.8 F (36.6 C) (03/19 0502) Pulse Rate:  [62-105] 62 (03/19 0502) Resp:  [15-19] 16 (03/19 0502) BP: (92-155)/(60-71) 92/61 mmHg (03/19 0502) SpO2:  [95 %-100 %] 100 % (03/19 0502) Weight:  [78.019 kg (172 lb)] 78.019 kg (172 lb) (03/18 1349)  Intake/Output from previous day: 03/18 0701 - 03/19 0700 In: 3550 [P.O.:840; I.V.:2610; IV Piggyback:100] Out: 9458 [Urine:4480; Blood:50] Intake/Output this shift:     Recent Labs  03/21/13 1325  HGB 12.6    Recent Labs  03/21/13 1325  WBC 5.7  RBC 4.03  HCT 37.8  PLT 306    Recent Labs  03/21/13 1325  NA 139  K 4.4  CL 102  CO2 26  BUN 15  CREATININE 0.81  GLUCOSE 105*  CALCIUM 9.4   No results found for this basename: LABPT, INR,  in the last 72 hours  Neurologically intact ABD soft Neurovascular intact Sensation intact distally Intact pulses distally Dorsiflexion/Plantar flexion intact Incision: dressing C/D/I and no drainage No cellulitis present Compartment soft no calf pain or sign of DVT  Assessment/Plan: 1 Day Post-Op Procedure(s) (LRB): MICRO LUMBAR DECOMPRESSION, FORAMINOTOMY L4-5 (Bilateral) Advance diet Up with therapy D/C IV fluids Reviewed D/C instructions, Lspine precautions D/C home today Follow up 10-14 days with Dr. Tonita Cong Will discuss with Dr. Mliss Fritz, Conley Rolls. 03/24/2013, 7:26 AM

## 2013-03-24 NOTE — Evaluation (Signed)
Physical Therapy Evaluation Patient Details Name: Holly Best MRN: [id number] DOB: 10-02-1950 Today's Date: 03/24/2013 Time: 6659-9357 PT Time Calculation (min): 27 min  PT Assessment / Plan / Recommendation History of Present Illness  back surgery per Dr Tonita Cong  Clinical Impression  Pt s/p Bil L4-5 hemilaminotomies and presents with functional mobility limitations 2* post op pain and back precautions.  Pt is mobilizing at min guard/Sup level and plans d/c home this date with assist of spouse.    PT Assessment  Patient needs continued PT services    Follow Up Recommendations  No PT follow up    Does the patient have the potential to tolerate intense rehabilitation      Barriers to Discharge        Equipment Recommendations  None recommended by PT    Recommendations for Other Services OT consult   Frequency 7X/week    Precautions / Restrictions Precautions Precautions: Back Precaution Booklet Issued: Yes (comment) Precaution Comments: Precautions reviewed with pt and spouse Restrictions Weight Bearing Restrictions: No   Pertinent Vitals/Pain 4/10; premed,       Mobility  Bed Mobility Overal bed mobility: Needs Assistance Bed Mobility: Supine to Sit Supine to sit: Min guard General bed mobility comments: cues for log roll technique and use of UEs to self assist Transfers Overall transfer level: Needs assistance Equipment used: Rolling walker (2 wheeled);None Transfers: Sit to/from Stand Sit to Stand: Supervision General transfer comment: verbal cues for hand placement and back precautions Ambulation/Gait Ambulation/Gait assistance: Min guard;Supervision Ambulation Distance (Feet): 450 Feet Assistive device: Rolling walker (2 wheeled);None Gait Pattern/deviations: Step-through pattern;Shuffle;Trunk flexed General Gait Details: cues for posture; ambulated 350' with RW and additional 100' with no assist device    Exercises     PT Diagnosis: Difficulty  walking  PT Problem List: Decreased strength;Decreased range of motion;Decreased activity tolerance;Decreased mobility;Decreased knowledge of use of DME;Pain;Decreased knowledge of precautions PT Treatment Interventions: DME instruction;Gait training;Functional mobility training;Therapeutic activities;Therapeutic exercise;Patient/family education     PT Goals(Current goals can be found in the care plan section) Acute Rehab PT Goals Patient Stated Goal: Resume previous lifestyle without pain PT Goal Formulation: No goals set, d/c therapy  Visit Information  Last PT Received On: 03/24/13 Assistance Needed: +1 History of Present Illness: back surgery per Dr Tonita Cong       Prior Functioning  Home Living Family/patient expects to be discharged to:: Private residence Living Arrangements: Spouse/significant other Available Help at Discharge: Family Type of Home: House Home Access: Level entry Home Layout: One Coppock: Bedside commode Prior Function Level of Independence: Independent Communication Communication: No difficulties    Cognition  Cognition Arousal/Alertness: Awake/alert Behavior During Therapy: WFL for tasks assessed/performed Overall Cognitive Status: Within Functional Limits for tasks assessed    Extremity/Trunk Assessment Upper Extremity Assessment Upper Extremity Assessment: Overall WFL for tasks assessed Lower Extremity Assessment Lower Extremity Assessment: Overall WFL for tasks assessed   Balance    End of Session PT - End of Session Activity Tolerance: Patient tolerated treatment well Patient left: in chair;with call bell/phone within reach;with family/visitor present Nurse Communication: Mobility status  GP Functional Assessment Tool Used: Functional mobility Functional Limitation: Mobility: Walking and moving around Mobility: Walking and Moving Around Current Status (S1779): At least 1 percent but less than 20 percent impaired, limited or  restricted Mobility: Walking and Moving Around Goal Status [phone]): At least 1 percent but less than 20 percent impaired, limited or restricted Mobility: Walking and Moving Around Discharge Status [phone]): At  least 1 percent but less than 20 percent impaired, limited or restricted   Holly Best 03/24/2013, 12:30 PM

## 2013-03-24 NOTE — Discharge Summary (Signed)
Physician Discharge Summary   Patient ID: Holly Best MRN: [id number] DOB/AGE: 04-04-1950 63 y.o.  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 Depression   . Hypercholesteremia    Discharge Diagnoses:   Active Problems:   Spinal stenosis of lumbar region with neurogenic claudication  Procedure:  Procedure(s) (LRB): MICRO LUMBAR DECOMPRESSION, FORAMINOTOMY L4-5 (Bilateral)   Consults: None  HPI:  see H&P    Laboratory Data: Hospital Outpatient Visit on 03/21/2013  Component Date Value Ref Range Status  . Sodium 03/21/2013 139  137 - 147 mEq/L Final  . Potassium 03/21/2013 4.4  3.7 - 5.3 mEq/L Final  . Chloride 03/21/2013 102  96 - 112 mEq/L Final  . CO2 03/21/2013 26  19 - 32 mEq/L Final  . Glucose, Bld 03/21/2013 105* 70 - 99 mg/dL Final  . BUN 03/21/2013 15  6 - 23 mg/dL Final  . Creatinine, Ser 03/21/2013 0.81  0.50 - 1.10 mg/dL Final  . Calcium 03/21/2013 9.4  8.4 - 10.5 mg/dL Final  . GFR calc non Af Amer 03/21/2013 76* >90 mL/min Final  . GFR calc Af Amer 03/21/2013 88* >90 mL/min Final   Comment: (NOTE)                          The eGFR has been calculated using the CKD EPI equation.                          This calculation has not been validated in all clinical situations.                          eGFR's persistently <90 mL/min signify possible Chronic Kidney                          Disease.  . WBC 03/21/2013 5.7  4.0 - 10.5 K/uL Final  . RBC 03/21/2013 4.03  3.87 - 5.11 MIL/uL Final  . Hemoglobin 03/21/2013 12.6  12.0 - 15.0 g/dL Final  . HCT 03/21/2013 37.8  36.0 - 46.0 % Final  . MCV 03/21/2013 93.8  78.0 - 100.0 fL Final  . MCH 03/21/2013 31.3  26.0 - 34.0 pg Final  . MCHC 03/21/2013 33.3  30.0 - 36.0 g/dL Final  . RDW 03/21/2013 12.9  11.5 - 15.5 % Final  . Platelets 03/21/2013 306  150 - 400 K/uL  Final  . MRSA, PCR 03/21/2013 NEGATIVE  NEGATIVE Final  . Staphylococcus aureus 03/21/2013 POSITIVE* NEGATIVE Final   Comment:                                 The Xpert SA Assay (FDA                          approved for NASAL specimens                          in patients over 15 years of age),                          is one component  of                          a comprehensive surveillance                          program.  Test performance has                          been validated by [facility] for patients greater                          than or equal to 19 year old.                          It is not intended                          to diagnose infection nor to                          guide or monitor treatment.    Recent Labs  03/21/13 1325  HGB 12.6    Recent Labs  03/21/13 1325  WBC 5.7  RBC 4.03  HCT 37.8  PLT 306    Recent Labs  03/21/13 1325  NA 139  K 4.4  CL 102  CO2 26  BUN 15  CREATININE 0.81  GLUCOSE 105*  CALCIUM 9.4   No results found for this basename: LABPT, INR,  in the last 72 hours  X-Rays:Dg Lumbar Spine 2-3 Views  03/21/2013   CLINICAL DATA:  Preop lumbar decompression.  EXAM: LUMBAR SPINE - 2-3 VIEW  COMPARISON:  MRI 02/25/2013  FINDINGS: Partial sacralization of the L5 vertebral body. Normal alignment. Mild leftward scoliosis in the mid lumbar spine. Disc space narrowing at L2-3, L3-4 and L5-S1. No fracture or malalignment. SI joints are symmetric and unremarkable.  IMPRESSION: Degenerative disc disease.  No acute findings.   Electronically Signed   By: Rolm Baptise M.D.   On: 03/21/2013 15:04   Dg Spine Portable 1 View  03/23/2013   CLINICAL DATA:  Surgical level L4-5.  EXAM: PORTABLE SPINE - 1 VIEW  COMPARISON:  03/23/2013  FINDINGS: Surgical instruments have been advanced. There is an instrument overlying the spinal canal under the L4 lamina. There is also an instrument overlying the spinal canal above  the L5 lamina. These mark the L4-5 disc space.  IMPRESSION: L4-5 disc spaces localized.   Electronically Signed   By: Franchot Gallo M.D.   On: 03/23/2013 12:55   Dg Spine Portable 1 View  03/23/2013   CLINICAL DATA:  Intraoperative localization in the lumbar spine.  EXAM: PORTABLE SPINE - 1 VIEW  COMPARISON:  DG SPINE PORTABLE 1 VIEW dated 03/23/2013; MR LUMBAR SPINE W/O CM dated 02/25/2013  FINDINGS: As on the prior MRI and prior radiograph, the transitional lumbosacral vertebra is labeled L5.  Tissue spreaders are in place centered over the L4-5 interspace, with a blunt tip probe oriented towards the L4-5 intervertebral disc.  IMPRESSION: 1. L4-5 intraoperative localization.   Electronically Signed   By: Sherryl Barters M.D.   On: 03/23/2013 11:56  Dg Spine Portable 1 View  03/23/2013   CLINICAL DATA:  Preoperative localization film.  EXAM: PORTABLE SPINE - 1 VIEW  COMPARISON:  DG LUMBAR SPINE 2-3 VIEWS dated 03/21/2013  FINDINGS: The 2 metallic localization needles lie within the subcutaneous soft tissues of the lower back posterior to the L4 and L5 levels bracketing the L4-5 disc level.  IMPRESSION: The surgical localization needles li in the subcutaneous soft tissues bracketing the L4-5 disc level.   Electronically Signed   By: David  Martinique   On: 03/23/2013 11:34    EKG:No orders found for this or any previous visit.   Hospital Course: Patient was admitted to [facility] and taken to the OR and underwent the above state procedure without complications.  Patient tolerated the procedure well and was later transferred to the recovery room and then to the orthopaedic floor for postoperative care.  They were given PO and IV analgesics for pain control following their surgery.  They were given 24 hours of postoperative antibiotics.   PT was consulted postop to assist with mobility and transfers.  The patient was allowed to be WBAT with therapy and was taught back precautions. Discharge planning  was consulted to help with postop disposition and equipment needs.  Patient had a good night on the evening of surgery and started to get up OOB with therapy on day one. Patient was seen in rounds and was ready to go home on day one.  They were given discharge instructions and dressing directions.  They were instructed on when to follow up in the office with Dr. Tonita Cong.  Discharge Medications: Prior to Admission medications   Medication Sig Start Date End Date Taking? Authorizing Provider  calcium carbonate (TUMS - DOSED IN MG ELEMENTAL CALCIUM) 500 MG chewable tablet Chew 1 tablet by mouth at bedtime.    Historical Provider, MD  docusate sodium (COLACE) 100 MG capsule Take 1 capsule (100 mg total) by mouth 2 (two) times daily. 03/24/13   Conley Rolls. Temeka Pore, PA-C  ezetimibe (ZETIA) 10 MG tablet Take 10 mg by mouth at bedtime.    Historical Provider, MD  LORazepam (ATIVAN) 1 MG tablet Take 1 mg by mouth at bedtime.    Historical Provider, MD  methocarbamol (ROBAXIN) 500 MG tablet Take 1 tablet (500 mg total) by mouth 3 (three) times daily. 03/23/13   Johnn Hai, MD  naproxen (NAPROSYN) 500 MG tablet Take 500 mg by mouth 2 (two) times daily with a meal.    Historical Provider, MD  oxyCODONE-acetaminophen (PERCOCET) 7.5-325 MG per tablet Take 1-2 tablets by mouth every 4 (four) hours as needed for pain. 03/23/13   Johnn Hai, MD  PARoxetine (PAXIL) 20 MG tablet Take 20 mg by mouth every evening.    Historical Provider, MD    Diet: Regular diet Activity:WBAT; Lspine precautions Follow-up:in 10-14 days Disposition - Home Discharged Condition: good   Discharge Orders   Future Orders Complete By Expires   Call MD / Call 911  As directed    Comments:     If you experience chest pain or shortness of breath, CALL 911 and be transported to the hospital emergency room.  If you develope a fever above 101 F, pus (white drainage) or increased drainage or redness at the wound, or calf pain, call your  surgeon's office.   Constipation Prevention  As directed    Comments:     Drink plenty of fluids.  Prune juice may be helpful.  You  may use a stool softener, such as Colace (over the counter) 100 mg twice a day.  Use MiraLax (over the counter) for constipation as needed.   Diet - low sodium heart healthy  As directed    Increase activity slowly as tolerated  As directed        Medication List    STOP taking these medications       oxyCODONE-acetaminophen 5-325 MG per tablet  Commonly known as:  PERCOCET/ROXICET  Replaced by:  oxyCODONE-acetaminophen 7.5-325 MG per tablet      TAKE these medications       calcium carbonate 500 MG chewable tablet  Commonly known as:  TUMS - dosed in mg elemental calcium  Chew 1 tablet by mouth at bedtime.     docusate sodium 100 MG capsule  Commonly known as:  COLACE  Take 1 capsule (100 mg total) by mouth 2 (two) times daily.     ezetimibe 10 MG tablet  Commonly known as:  ZETIA  Take 10 mg by mouth at bedtime.     LORazepam 1 MG tablet  Commonly known as:  ATIVAN  Take 1 mg by mouth at bedtime.     methocarbamol 500 MG tablet  Commonly known as:  ROBAXIN  Take 1 tablet (500 mg total) by mouth 3 (three) times daily.     naproxen 500 MG tablet  Commonly known as:  NAPROSYN  Take 500 mg by mouth 2 (two) times daily with a meal.     oxyCODONE-acetaminophen 7.5-325 MG per tablet  Commonly known as:  PERCOCET  Take 1-2 tablets by mouth every 4 (four) hours as needed for pain.     PARoxetine 20 MG tablet  Commonly known as:  PAXIL  Take 20 mg by mouth every evening.           Follow-up Information   Follow up with BEANE,JEFFREY C, MD In 2 weeks.   Specialty:  Orthopedic Surgery   Contact information:   [address] [place] [address] [phone]       Signed: Cecilie Kicks. 03/24/2013, 11:20 AM

## 2013-03-24 NOTE — Anesthesia Postprocedure Evaluation (Signed)
  Anesthesia Post-op Note  Patient: Holly Best  Procedure(s) Performed: Procedure(s) (LRB): MICRO LUMBAR DECOMPRESSION, FORAMINOTOMY L4-5 (Bilateral)  Patient Location: PACU  Anesthesia Type: General  Level of Consciousness: awake and alert   Airway and Oxygen Therapy: Patient Spontanous Breathing  Post-op Pain: mild  Post-op Assessment: Post-op Vital signs reviewed, Patient's Cardiovascular Status Stable, Respiratory Function Stable, Patent Airway and No signs of Nausea or vomiting  Last Vitals:  Filed Vitals:   03/24/13 0502  BP: 92/61  Pulse: 62  Temp: 36.6 C  Resp: 16    Post-op Vital Signs: stable   Complications: No apparent anesthesia complications

## 2013-03-24 NOTE — Progress Notes (Signed)
   CARE MANAGEMENT NOTE 03/24/2013  Patient:  Holly Best, Holly Best   Account Number:  [id number]  Date Initiated:  03/24/2013  Documentation initiated by:  [facility]  Subjective/Objective Assessment:   MICRO LUMBAR DECOMPRESSION, FORAMINOTOMY L4-5     Action/Plan:   no HH needed.   Anticipated DC Date:  03/24/2013   Anticipated DC Plan:  [place]  CM consult      Choice offered to / List presented to:             Status of service:  Completed, signed off Medicare Important Message given?   (If response is "NO", the following Medicare IM given date fields will be blank) Date Medicare IM given:   Date Additional Medicare IM given:    Discharge Disposition:  HOME/SELF CARE  Per UR Regulation:    If discussed at Long Length of Stay Meetings, dates discussed:    Comments:  03/24/2013 1030 NCM spoke to pt and no NCM needs identified. States her husband will be at home to assist with her care. Jonnie Finner RN CCM Case Mgmt phone [phone]

## 2016-01-07 HISTORY — PX: BREAST LUMPECTOMY: SHX2

## 2016-07-11 ENCOUNTER — Other Ambulatory Visit: Payer: Self-pay | Admitting: Obstetrics & Gynecology

## 2016-07-11 DIAGNOSIS — R928 Other abnormal and inconclusive findings on diagnostic imaging of breast: Secondary | ICD-10-CM

## 2016-07-17 ENCOUNTER — Ambulatory Visit
Admission: RE | Admit: 2016-07-17 | Discharge: 2016-07-17 | Disposition: A | Discharge status: Home / Self Care | Payer: Federal, State, Local not specified - PPO | Source: Ambulatory Visit | Attending: Obstetrics & Gynecology | Admitting: Obstetrics & Gynecology

## 2016-07-17 ENCOUNTER — Other Ambulatory Visit: Payer: Self-pay | Admitting: Obstetrics & Gynecology

## 2016-07-17 ENCOUNTER — Other Ambulatory Visit: Payer: Self-pay

## 2016-07-17 ENCOUNTER — Ambulatory Visit
Admission: RE | Admit: 2016-07-17 | Discharge: 2016-07-17 | Disposition: A | Discharge status: Home / Self Care | Payer: Self-pay | Source: Ambulatory Visit | Attending: Obstetrics & Gynecology | Admitting: Obstetrics & Gynecology

## 2016-07-17 DIAGNOSIS — R928 Other abnormal and inconclusive findings on diagnostic imaging of breast: Secondary | ICD-10-CM

## 2016-07-17 DIAGNOSIS — N6310 Unspecified lump in the right breast, unspecified quadrant: Secondary | ICD-10-CM

## 2016-07-21 ENCOUNTER — Other Ambulatory Visit: Payer: Self-pay | Admitting: Obstetrics & Gynecology

## 2016-07-21 DIAGNOSIS — N6310 Unspecified lump in the right breast, unspecified quadrant: Secondary | ICD-10-CM

## 2016-07-22 ENCOUNTER — Ambulatory Visit
Admission: RE | Admit: 2016-07-22 | Discharge: 2016-07-22 | Disposition: A | Discharge status: Home / Self Care | Payer: Federal, State, Local not specified - PPO | Source: Ambulatory Visit | Attending: Obstetrics & Gynecology | Admitting: Obstetrics & Gynecology

## 2016-07-22 DIAGNOSIS — N6310 Unspecified lump in the right breast, unspecified quadrant: Secondary | ICD-10-CM

## 2016-07-24 ENCOUNTER — Telehealth: Payer: Self-pay | Admitting: *Deleted

## 2016-07-24 DIAGNOSIS — Z170 Estrogen receptor positive status [ER+]: Principal | ICD-10-CM

## 2016-07-24 DIAGNOSIS — C50411 Malignant neoplasm of upper-outer quadrant of right female breast: Secondary | ICD-10-CM

## 2016-07-24 NOTE — Telephone Encounter (Signed)
Confirmed [facility] for 07/30/16 at 1215 .  Instructions and contact information given.

## 2016-07-24 NOTE — Telephone Encounter (Signed)
Left vm regarding [place] for 7.25.18. Contact information provided.

## 2016-07-29 ENCOUNTER — Ambulatory Visit: Payer: Federal, State, Local not specified - PPO | Admitting: Radiation Oncology

## 2016-07-30 ENCOUNTER — Encounter: Payer: Self-pay | Admitting: Physical Therapy

## 2016-07-30 ENCOUNTER — Ambulatory Visit
Admission: RE | Admit: 2016-07-30 | Discharge: 2016-07-30 | Disposition: A | Discharge status: Home / Self Care | Payer: Federal, State, Local not specified - PPO | Source: Ambulatory Visit | Attending: Radiation Oncology | Admitting: Radiation Oncology

## 2016-07-30 ENCOUNTER — Ambulatory Visit (HOSPITAL_BASED_OUTPATIENT_CLINIC_OR_DEPARTMENT_OTHER): Payer: Federal, State, Local not specified - PPO | Admitting: Oncology

## 2016-07-30 ENCOUNTER — Ambulatory Visit: Payer: Federal, State, Local not specified - PPO | Attending: General Surgery | Admitting: Physical Therapy

## 2016-07-30 ENCOUNTER — Encounter: Payer: Self-pay | Admitting: Oncology

## 2016-07-30 ENCOUNTER — Other Ambulatory Visit (HOSPITAL_BASED_OUTPATIENT_CLINIC_OR_DEPARTMENT_OTHER): Payer: Federal, State, Local not specified - PPO

## 2016-07-30 ENCOUNTER — Ambulatory Visit: Payer: Self-pay | Admitting: General Surgery

## 2016-07-30 VITALS — BP 125/73 | HR 72 | Temp 98.2°F | Resp 18 | Ht 66.0 in | Wt 159.0 lb

## 2016-07-30 DIAGNOSIS — Z801 Family history of malignant neoplasm of trachea, bronchus and lung: Secondary | ICD-10-CM | POA: Insufficient documentation

## 2016-07-30 DIAGNOSIS — C50411 Malignant neoplasm of upper-outer quadrant of right female breast: Secondary | ICD-10-CM

## 2016-07-30 DIAGNOSIS — Z7982 Long term (current) use of aspirin: Secondary | ICD-10-CM | POA: Insufficient documentation

## 2016-07-30 DIAGNOSIS — Z87891 Personal history of nicotine dependence: Secondary | ICD-10-CM | POA: Insufficient documentation

## 2016-07-30 DIAGNOSIS — Z510 Encounter for antineoplastic radiation therapy: Secondary | ICD-10-CM | POA: Insufficient documentation

## 2016-07-30 DIAGNOSIS — Z79899 Other long term (current) drug therapy: Secondary | ICD-10-CM | POA: Insufficient documentation

## 2016-07-30 DIAGNOSIS — R293 Abnormal posture: Secondary | ICD-10-CM | POA: Diagnosis present

## 2016-07-30 DIAGNOSIS — Z170 Estrogen receptor positive status [ER+]: Secondary | ICD-10-CM

## 2016-07-30 DIAGNOSIS — Z803 Family history of malignant neoplasm of breast: Secondary | ICD-10-CM | POA: Insufficient documentation

## 2016-07-30 DIAGNOSIS — E7800 Pure hypercholesterolemia, unspecified: Secondary | ICD-10-CM | POA: Insufficient documentation

## 2016-07-30 DIAGNOSIS — K5090 Crohn's disease, unspecified, without complications: Secondary | ICD-10-CM | POA: Insufficient documentation

## 2016-07-30 DIAGNOSIS — F329 Major depressive disorder, single episode, unspecified: Secondary | ICD-10-CM | POA: Insufficient documentation

## 2016-07-30 LAB — COMPREHENSIVE METABOLIC PANEL
ALBUMIN: 3.9 g/dL (ref 3.5–5.0)
ALK PHOS: 81 U/L (ref 40–150)
ALT: 11 U/L (ref 0–55)
ANION GAP: 8 meq/L (ref 3–11)
AST: 15 U/L (ref 5–34)
BILIRUBIN TOTAL: 0.65 mg/dL (ref 0.20–1.20)
BUN: 16 mg/dL (ref 7.0–26.0)
CO2: 26 mEq/L (ref 22–29)
CREATININE: 1 mg/dL (ref 0.6–1.1)
Calcium: 9 mg/dL (ref 8.4–10.4)
Chloride: 109 mEq/L (ref 98–109)
EGFR: 57 mL/min/{1.73_m2} — AB (ref >90)
Glucose: 91 mg/dl (ref 70–140)
Potassium: 4.4 mEq/L (ref 3.5–5.1)
Sodium: 143 mEq/L (ref 136–145)
TOTAL PROTEIN: 6.5 g/dL (ref 6.4–8.3)

## 2016-07-30 LAB — CBC WITH DIFFERENTIAL/PLATELET
BASO%: 1.2 % (ref 0.0–2.0)
Basophils Absolute: 0.1 10*3/uL (ref 0.0–0.1)
EOS ABS: 0.2 10*3/uL (ref 0.0–0.5)
EOS%: 2.6 % (ref 0.0–7.0)
HEMATOCRIT: 36.3 % (ref 34.8–46.6)
HEMOGLOBIN: 12.2 g/dL (ref 11.6–15.9)
LYMPH%: 20.3 % (ref 14.0–49.7)
MCH: 32.3 pg (ref 25.1–34.0)
MCHC: 33.6 g/dL (ref 31.5–36.0)
MCV: 96 fL (ref 79.5–101.0)
MONO#: 0.4 10*3/uL (ref 0.1–0.9)
MONO%: 6.3 % (ref 0.0–14.0)
NEUT%: 69.6 % (ref 38.4–76.8)
NEUTROS ABS: 4.4 10*3/uL (ref 1.5–6.5)
PLATELETS: 318 10*3/uL (ref 145–400)
RBC: 3.78 10*6/uL (ref 3.70–5.45)
RDW: 12.8 % (ref 11.2–14.5)
WBC: 6.3 10*3/uL (ref 3.9–10.3)
lymph#: 1.3 10*3/uL (ref 0.9–3.3)

## 2016-07-30 NOTE — Progress Notes (Signed)
[place]  [phone] [phone]     ID: Holly Best DOB: [birth year]  MR#: [id number]  AGT#:[id number]  Patient Care Team: Manon Hilding, MD as PCP - General (Cardiology) Jovita Kussmaul, MD as Consulting Physician (General Surgery) Magrinat, [name], MD as Consulting Physician (Oncology) Eppie Gibson, MD as Attending Physician (Radiation Oncology) Alden Hipp, MD as Consulting Physician (Obstetrics and Gynecology) Susa Day, MD as Consulting Physician (Orthopedic Surgery) Sandford Craze, MD as Referring Physician (Dermatology) Suella Broad, MD as Consulting Physician (Physical Medicine and Rehabilitation) Virgia Land, MD as Referring Physician (Internal Medicine) Chauncey Cruel, MD OTHER MD:  CHIEF COMPLAINT: Estrogen receptor positive breast cancer  CURRENT TREATMENT: Awaiting definitive surgery   BREAST CANCER HISTORY: Holly Best had screening mammography July 2018 showing a possible mass in the upper-outer quadrant of the right breast and calcifications in the lower inner quadrant of the left breast. On 07/17/2016 she underwent bilateral diagnostic mammography with ultrasonography and right breast ultrasonography at the [facility]. Breast density was category C. In the left breast, a group of coarse calcifications in the lower inner quadrant measuring 1.8 cm were felt to be benign.  In the right breast there was a spiculated mass in the upper-outer quadrant which was palpable as a 2.5 cm area of thickening at the 11:00 position of the right breast. There was no palpable right axillary adenopathy. Ultrasonography of the right breast mass confirmed a 0.8 cm hypoechoic and irregular mass at the 10:30-11:00 position 6 cm from the nipple. Ultrasound of the right axilla was sonographically benign.  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  The patient's subsequent history is as detailed below.  INTERVAL HISTORY: Holly Best was evaluated in the multidisciplinary breast cancer clinic on 07/30/2016 accompanied by her husband Holly Best. Her case was also presented in the multidisciplinary breast cancer conference that same morning. At that time a preliminary plan was proposed: Genetics testing, breast conserving surgery with sentinel lymph node sampling, adjuvant radiation, and Oncotype testing.  REVIEW OF SYSTEMS: There were no specific symptoms leading to the original mammogram, which was routinely scheduled. The patient denies unusual headaches, visual changes, nausea, vomiting, stiff neck, dizziness, or gait imbalance. There has been no cough, phlegm production, or pleurisy, no chest pain or pressure, and no change in bowel or bladder habits. The patient denies fever, rash, bleeding, unexplained fatigue or unexplained weight loss. Holly Best does have chronic pain problems particularly involving her back. She has some stress urinary incontinence. A detailed review of systems was otherwise entirely negative.   PAST MEDICAL HISTORY: Past Medical History:  Diagnosis Date  . Crohn disease ([place])   . Depression   . Hypercholesteremia     PAST SURGICAL HISTORY: Past Surgical History:  Procedure Laterality Date  . dental implants     upper  . LUMBAR LAMINECTOMY Bilateral 03/23/2013   Procedure: MICRO LUMBAR DECOMPRESSION, FORAMINOTOMY L4-5;  Surgeon: Johnn Hai, MD;  Location: [facility];  Service: Orthopedics;  Laterality: Bilateral;    FAMILY HISTORY Family History  Problem Relation Age of Onset  . Breast cancer Sister 24  . Breast cancer Other 55  . Lung cancer Mother   . Lung cancer Father   The patient's father  died from lung cancer  at age 73. The patient's mother also died from lung cancer at age 8. The patient has 3 brothers, one sister. The patient's sister was diagnosed with breast cancer at age 73. The patient had a niece with breast cancer (not her sister's daughter) at age 52.   GYNECOLOGIC HISTORY:  No LMP recorded. Patient is postmenopausal. Menarche age 78, first live birth age 69 the patient is [name] P1. She went through the change of life approximately age 52. She did not take hormone replacement. She never used oral contraceptives.  SOCIAL HISTORY:  Holly Best is retired from the [employer]. Her husband Holly Best") worked in Charity fundraiser. Their son lives in [place] and works for [employer]. The patient has 2 grandchildren. They attend a nondenominational church and [place]. [employer]     ADVANCED DIRECTIVES: In place   HEALTH MAINTENANCE: Social History  Substance Use Topics  . Smoking status: Former Smoker    Packs/day: 1.00    Years: 10.00    Types: Cigarettes    Quit date: 02/17/1978  . Smokeless tobacco: Never Used  . Alcohol use Yes     Comment: occassionally     Colonoscopy: 2018  PAP:  Bone density:   No Known Allergies  Current Outpatient Prescriptions  Medication Sig Dispense Refill  . aspirin EC 81 MG tablet Take 81 mg by mouth daily.    Marland Kitchen atorvastatin (LIPITOR) 10 MG tablet Take 5 mg by mouth 3 (three) times a week.    . calcium carbonate (TUMS - DOSED IN MG ELEMENTAL CALCIUM) 500 MG chewable tablet Chew 1,000 mg by mouth at bedtime.     Marland Kitchen LORazepam (ATIVAN) 1 MG tablet Take 1 mg by mouth at bedtime.    . naproxen (NAPROSYN) 500 MG tablet Take 500 mg by mouth 2 (two) times daily as needed.     Marland Kitchen oxyCODONE-acetaminophen (PERCOCET) 7.5-325 MG per tablet Take 1-2 tablets by mouth every 4 (four) hours as needed for pain. 50 tablet 0  . PARoxetine (PAXIL) 20 MG tablet Take 20 mg by mouth every evening.    . docusate sodium (COLACE) 100 MG capsule Take 1 capsule (100 mg total) by mouth 2 (two)  times daily. (Patient not taking: Reported on 07/30/2016) 30 capsule 0  . ezetimibe (ZETIA) 10 MG tablet Take 10 mg by mouth at bedtime.    . methocarbamol (ROBAXIN) 500 MG tablet Take 1 tablet (500 mg total) by mouth 3 (three) times daily. (Patient not taking: Reported on 07/30/2016) 40 tablet 1   No current facility-administered medications for this visit.     OBJECTIVE:Middle-aged white woman who appears well.  Vitals:   07/30/16 1247  BP: 125/73  Pulse: 72  Resp: 18  Temp: 98.2 F (36.8 C)     Body mass index is 25.66 kg/m.   Wt Readings from Last 3 Encounters:  07/30/16 159 lb (72.1 kg)  03/23/13 172 lb (78 kg)  03/21/13 172 lb 9.6 oz (78.3 kg)      ECOG FS:1 - Symptomatic but completely ambulatory  Ocular: Sclerae unicteric, pupils round and equal Ear-nose-throat: Oropharynx clear and moist Lymphatic: No cervical or supraclavicular adenopathy Lungs no rales or rhonchi Heart regular rate and rhythm Abd soft, nontender, positive bowel sounds MSK no focal spinal tenderness, no joint edema Neuro: non-focal, well-oriented, appropriate affect Breasts: The right breast is status post recent biopsy. There is a moderate ecchymosis. I do not feel a well-defined mass. The left breast is unremarkable.  Both axillae are benign.   LAB RESULTS:  CMP     Component Value Date/Time   NA 143 07/30/2016 1221   K 4.4 07/30/2016 1221   CL 102 03/21/2013 1325   CO2 26 07/30/2016 1221   GLUCOSE 91 07/30/2016 1221   BUN 16.0 07/30/2016 1221   CREATININE 1.0 07/30/2016 1221   CALCIUM 9.0 07/30/2016 1221   PROT 6.5 07/30/2016 1221   ALBUMIN 3.9 07/30/2016 1221   AST 15 07/30/2016 1221   ALT 11 07/30/2016 1221   ALKPHOS 81 07/30/2016 1221   BILITOT 0.65 07/30/2016 1221   GFRNONAA 76 (L) 03/21/2013 1325   GFRAA 88 (L) 03/21/2013 1325    No results found for: TOTALPROTELP, ALBUMINELP, A1GS, A2GS, BETS, BETA2SER, GAMS, MSPIKE, SPEI  No results found for: Nils Pyle,  [facility]  Lab Results  Component Value Date   WBC 6.3 07/30/2016   NEUTROABS 4.4 07/30/2016   HGB 12.2 07/30/2016   HCT 36.3 07/30/2016   MCV 96.0 07/30/2016   PLT 318 07/30/2016      Chemistry      Component Value Date/Time   NA 143 07/30/2016 1221   K 4.4 07/30/2016 1221   CL 102 03/21/2013 1325   CO2 26 07/30/2016 1221   BUN 16.0 07/30/2016 1221   CREATININE 1.0 07/30/2016 1221      Component Value Date/Time   CALCIUM 9.0 07/30/2016 1221   ALKPHOS 81 07/30/2016 1221   AST 15 07/30/2016 1221   ALT 11 07/30/2016 1221   BILITOT 0.65 07/30/2016 1221       No results found for: LABCA2  No components found for: [id number]  No results for input(s): INR in the last 168 hours.  Urinalysis No results found for: COLORURINE, APPEARANCEUR, LABSPEC, PHURINE, GLUCOSEU, HGBUR, BILIRUBINUR, KETONESUR, PROTEINUR, UROBILINOGEN, NITRITE, LEUKOCYTESUR   STUDIES: US Breast Ltd Uni Right Inc Axilla  Result Date: 07/17/2016 CLINICAL DATA:  Possible mass in the upper-outer quadrant of the right breast and calcifications in the lower inner quadrant of the left breast on a recent screening mammogram. EXAM: 2D DIGITAL DIAGNOSTIC BILATERAL MAMMOGRAM WITH CAD AND ADJUNCT TOMO ULTRASOUND RIGHT BREAST COMPARISON:  Previous exam(s). ACR Breast Density Category c: The breast tissue is heterogeneously dense, which may obscure small masses. FINDINGS: True lateral and spot magnification views of the left breast demonstrate an 18 x 12 x 4 mm group of heterogeneous, coarse calcifications in the lower inner quadrant of the breast in the middle third. These include coral-like calcifications. 2D and 3D spot compression views of the right breast confirm a small, irregular, spiculated mass in the upper-outer quadrant in the middle third. Mammographic images were processed with CAD. On physical exam, there is an approximately 2.5 cm rounded area of palpable soft tissue thickening in the 10:30 - 11 o'clock  position of the right breast, 6 cm from the nipple. There are no palpable right axillary lymph nodes. Targeted ultrasound is performed, showing an irregular, markedly hypoechoic mass with ill-defined peripheral mildly increased echogenicity measuring approximately 8 x 8 x 8 mm in maximum dimensions in the 10:30 - 11 o'clock position of the right breast, 6 cm from the nipple. This corresponds to the mammographic mass. Ultrasound of the right axilla demonstrated normal appearing right axillary lymph nodes. IMPRESSION: 1. 8 mm mass in the 10:30 - 11 o'clock position of the right breast with imaging features highly suspicious for malignancy. 2. Benign calcifications in the lower inner quadrant of the left breast, compatible with calcifications associated with  fibroadenomatoid changes. RECOMMENDATION: Ultrasound-guided core needle biopsy of the 8 mm mass in the 10:30 - 11 o'clock position of the right breast. This has been discussed with the patient and scheduled at 7:30 a.m. on 07/22/2016. I have discussed the findings and recommendations with the patient. Results were also provided in writing at the conclusion of the visit. If applicable, a reminder letter will be sent to the patient regarding the next appointment. BI-RADS CATEGORY  5: Highly suggestive of malignancy. Electronically Signed   By: Claudie Revering M.D.   On: 07/17/2016 17:04   Mm Diag Breast Tomo Bilateral  Result Date: 07/17/2016 CLINICAL DATA:  Possible mass in the upper-outer quadrant of the right breast and calcifications in the lower inner quadrant of the left breast on a recent screening mammogram. EXAM: 2D DIGITAL DIAGNOSTIC BILATERAL MAMMOGRAM WITH CAD AND ADJUNCT TOMO ULTRASOUND RIGHT BREAST COMPARISON:  Previous exam(s). ACR Breast Density Category c: The breast tissue is heterogeneously dense, which may obscure small masses. FINDINGS: True lateral and spot magnification views of the left breast demonstrate an 18 x 12 x 4 mm group of  heterogeneous, coarse calcifications in the lower inner quadrant of the breast in the middle third. These include coral-like calcifications. 2D and 3D spot compression views of the right breast confirm a small, irregular, spiculated mass in the upper-outer quadrant in the middle third. Mammographic images were processed with CAD. On physical exam, there is an approximately 2.5 cm rounded area of palpable soft tissue thickening in the 10:30 - 11 o'clock position of the right breast, 6 cm from the nipple. There are no palpable right axillary lymph nodes. Targeted ultrasound is performed, showing an irregular, markedly hypoechoic mass with ill-defined peripheral mildly increased echogenicity measuring approximately 8 x 8 x 8 mm in maximum dimensions in the 10:30 - 11 o'clock position of the right breast, 6 cm from the nipple. This corresponds to the mammographic mass. Ultrasound of the right axilla demonstrated normal appearing right axillary lymph nodes. IMPRESSION: 1. 8 mm mass in the 10:30 - 11 o'clock position of the right breast with imaging features highly suspicious for malignancy. 2. Benign calcifications in the lower inner quadrant of the left breast, compatible with calcifications associated with fibroadenomatoid changes. RECOMMENDATION: Ultrasound-guided core needle biopsy of the 8 mm mass in the 10:30 - 11 o'clock position of the right breast. This has been discussed with the patient and scheduled at 7:30 a.m. on 07/22/2016. I have discussed the findings and recommendations with the patient. Results were also provided in writing at the conclusion of the visit. If applicable, a reminder letter will be sent to the patient regarding the next appointment. BI-RADS CATEGORY  5: Highly suggestive of malignancy. Electronically Signed   By: Claudie Revering M.D.   On: 07/17/2016 17:04   Mm Clip Placement Right  Result Date: 07/22/2016 CLINICAL DATA:  Status post ultrasound-guided core biopsy of right breast mass.  EXAM: DIAGNOSTIC RIGHT MAMMOGRAM POST ULTRASOUND BIOPSY COMPARISON:  Previous exam(s). FINDINGS: Mammographic images were obtained following ultrasound guided biopsy of a mass in the upper-outer quadrant of the right breast. Mammographic images show there is a ribbon shaped clip in the upper-outer quadrant of the right breast in appropriate position. IMPRESSION: Status post ultrasound-guided core biopsy of the right breast with pathology pending. Final Assessment: Post Procedure Mammograms for Marker Placement Electronically Signed   By: Lillia Mountain M.D.   On: 07/22/2016 08:29   [place] Rt Breast Bx W Loc Dev 1st Lesion Img Bx  Spec US Guide  Addendum Date: 07/28/2016   ADDENDUM REPORT: 07/24/2016 14:22 ADDENDUM: Pathology revealed GRADE I INVASIVE DUCTAL CARCINOMA of the Right breast, 11:00 o'clock. This was found to be concordant by Dr. Lillia Mountain. Pathology results were discussed with the patient by telephone. The patient reported doing well after the biopsy with tenderness at the site. Post biopsy instructions and care were reviewed and questions were answered. The patient was encouraged to call [facility] for any additional concerns. The patient was referred to [facility] at [facility] on July 30, 2016. Pathology results reported by Terie Purser, RN on 07/24/2016. Electronically Signed   By: Lillia Mountain M.D.   On: 07/24/2016 14:22   Result Date: 07/28/2016 CLINICAL DATA:  Suspicious right breast mass. EXAM: ULTRASOUND GUIDED RIGHT BREAST CORE NEEDLE BIOPSY COMPARISON:  Previous exam(s). FINDINGS: I met with the patient and we discussed the procedure of ultrasound-guided biopsy, including benefits and alternatives. We discussed the high likelihood of a successful procedure. We discussed the risks of the procedure, including infection, bleeding, tissue injury, clip migration, and inadequate sampling. Informed written  consent was given. The usual time-out protocol was performed immediately prior to the procedure. Lesion quadrant: Upper-outer quadrant. Using sterile technique and 1% Lidocaine as local anesthetic, under direct ultrasound visualization, a 14 gauge spring-loaded device was used to perform biopsy of a mass in the 11 o'clock region of the right breast using an inferior to superior approach. At the conclusion of the procedure a ribbon shaped tissue marker clip was deployed into the biopsy cavity. Follow up 2 view mammogram was performed and dictated separately. IMPRESSION: Ultrasound guided biopsy of the right breast. No apparent complications. Electronically Signed: By: Lillia Mountain M.D. On: 07/22/2016 08:11    ELIGIBLE FOR AVAILABLE RESEARCH PROTOCOL: no  ASSESSMENT: 66 y.o. Holly Best, Holly Best woman status post right breast upper outer quadrant biopsy 07/22/2016 for a clinical T1b N0, stage IA invasive ductal carcinoma, grade 1, estrogen and progesterone receptor positive, HER-2 nonamplified, with an MIB-1 of 5%.  (1) breast conserving surgery with sentinel lymph node sampling pending  (2) genetics testing pending: Patient does not wish to delay lumpectomy while awaiting genetics results  (3) Oncotype DX to be obtained from the definitive surgical samples: Chemotherapy not anticipated  (4) adjuvant radiation  (5) to start anti-estrogens at the completion of local treatment.  PLAN: We spent the better part of today's hour-long appointment discussing the biology of breast cancer in general, and the specifics of the patient's tumor in particular.We first reviewed the fact that cancer is not one disease but more than 100 different diseases and that it is important to keep them separate-- otherwise when friends and relatives discuss their own cancer experiences with [place] confusion can result. Similarly we explained that if breast cancer spreads to the bone or liver, the patient would not have bone cancer or liver  cancer, but breast cancer in the bone and breast cancer in the liver: one cancer in three places-- not 3 different cancers which otherwise would have to be treated in 3 different ways.  We discussed the difference between local and systemic therapy. In terms of loco-regional treatment, lumpectomy plus radiation is equivalent to mastectomy as far as survival is concerned. For this reason, and because the cosmetic results are generally superior, we recommend breast conserving surgery. We also noted that in terms of sequencing of treatments, whether systemic therapy or surgery is done  first does not affect the ultimate outcome.  We then discussed the rationale for systemic therapy. There is some risk that this cancer may have already spread to other parts of her body. Patients frequently ask at this point about bone scans, CAT scans and PET scans to find out if they have occult breast cancer somewhere else. The problem is that in early stage disease we are much more likely to find false positives then true cancers and this would expose the patient to unnecessary procedures as well as unnecessary radiation. Scans cannot answer the question the patient really would like to know, which is whether she has microscopic disease elsewhere in her body. For those reasons we do not recommend them.  Of course we would proceed to aggressive evaluation of any symptoms that might suggest metastatic disease, but that is not the case here.  Next we went over the options for systemic therapy which are anti-estrogens, anti-HER-2 immunotherapy, and chemotherapy. [place] does not meet criteria for anti-HER-2 immunotherapy. She is a good candidate for anti-estrogens.  The question of chemotherapy is more complicated. Chemotherapy is most effective in rapidly growing, aggressive tumors. It is much less effective in low-grade, slow growing cancers, like [place] 's. For that reason we are going to request an Oncotype from the  definitive surgical sample, as suggested by NCCN guidelines. That will help [place] make a definitive decision regarding chemotherapy in this case.  We then discussed the issue of genetics testing. In patients who carry a deleterious mutation [for example in a  BRCA gene], the risk of a new breast cancer developing in the future may be sufficiently great that the patient may choose bilateral mastectomies. However if she wishes to keep her breasts in that situation it is safe to do so. That would require intensified screening, which generally means not only yearly mammography but a yearly breast MRI as well. Of course, if there is a deleterious mutation bilateral oophorectomy would be necessary as there is no standard screening protocol for ovarian cancer.  I suggested to Mikka that she could start anti-estrogens now and wait until she has the genetics results so that, in case she does carry a deleterious mutation and decides to have bilateral mastectomies she does not have to have 2 surgeries. However she "can't wait to get this mass out of my breast". She is willing to go through 2 surgeries if necessary. Accordingly the plan is for initial surgery, Oncotype testing, likely no chemotherapy, adjuvant radiation, and then anti-estrogens.  Allen has a good understanding of the overall plan. She agrees with it. She knows the goal of treatment in her case is cure. She will call with any problems that may develop before her next visit here.  Chauncey Cruel, MD   07/30/2016 9:03 PM Medical Oncology and Hematology [facility] [address] [place], [place] [address] Tel. [phone]    Fax. [phone]

## 2016-07-30 NOTE — Patient Instructions (Signed)

## 2016-07-30 NOTE — Therapy (Signed)
[place], [place], [address] Phone: [phone]   Fax:  [phone]  Physical Therapy Evaluation  Patient Details  Name: Holly Best MRN: [id number] Date of Birth: Jun 14, 1950 Referring Provider: Dr. Autumn Messing  Encounter Date: 07/30/2016      PT End of Session - 07/30/16 1637    Visit Number 1   Number of Visits 1   PT Start Time 1761   PT Stop Time 6073  Also saw pt from 1515-1530 for a total of 26 minutes   PT Time Calculation (min) 11 min   Activity Tolerance Patient tolerated treatment well   Behavior During Therapy [facility] for tasks assessed/performed      Past Medical History:  Diagnosis Date  . Crohn disease ([place])   . Depression   . Hypercholesteremia     Past Surgical History:  Procedure Laterality Date  . dental implants     upper  . LUMBAR LAMINECTOMY Bilateral 03/23/2013   Procedure: MICRO LUMBAR DECOMPRESSION, FORAMINOTOMY L4-5;  Surgeon: Johnn Hai, MD;  Location: [facility];  Service: Orthopedics;  Laterality: Bilateral;    There were no vitals filed for this visit.       Subjective Assessment - 07/30/16 1630    Subjective Patient reports she is here to be seen by her medical team for her newly diagnosed right breast cancer.   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 It measures 8 mm and is located in the upper outer quadrant. It is ER/PR positive and HER2 negative with a Ki67 of 5%.   Patient Stated Goals Reduce lymphedema risk and learn post op shoulder ROM HEP   Currently in Pain? Yes   Pain Score 7    Pain Location Back   Pain Orientation Lower   Pain Descriptors / Indicators Aching   Pain Type Chronic pain   Pain Radiating Towards right leg   Pain Onset More than a month ago   Pain Frequency Intermittent   Aggravating Factors  Staying in 1 position  a long time   Pain Relieving Factors Moving around            [facility] PT Assessment - 07/30/16 0001      Assessment   Medical Diagnosis Right breast cancer   Referring Provider Dr. Autumn Messing   Onset Date/Surgical Date 07/10/16   Hand Dominance Right   Prior Therapy none     Precautions   Precautions Other (comment)   Precaution Comments active cancer     Restrictions   Weight Bearing Restrictions No     Balance Screen   Has the patient fallen in the past 6 months No   Has the patient had a decrease in activity level because of a fear of falling?  No   Is the patient reluctant to leave their home because of a fear of falling?  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   Leisure She walks 30 minutes each day at [employer]   Overall Cognitive Status Within Functional Limits for tasks assessed     Posture/Postural Control  Posture/Postural Control Postural limitations   Postural Limitations Rounded Shoulders;Forward head     ROM / Strength   AROM / PROM / Strength AROM;Strength     AROM   AROM Assessment Site Shoulder;Cervical   Right/Left Shoulder Right;Left   Right Shoulder Extension 57 Degrees   Right Shoulder Flexion 132 Degrees   Right Shoulder ABduction 163 Degrees   Right Shoulder Internal Rotation 74 Degrees   Right Shoulder External Rotation 82 Degrees   Left Shoulder Extension 63 Degrees   Left Shoulder Flexion 132 Degrees   Left Shoulder ABduction 163 Degrees   Left Shoulder Internal Rotation 74 Degrees   Left Shoulder External Rotation 85 Degrees   Cervical Flexion WNL   Cervical Extension WNL   Cervical - Right Side Bend WNL   Cervical - Left Side Bend WNL   Cervical - Right Rotation WNL   Cervical -  Left Rotation WNL     Strength   Overall Strength Within functional limits for tasks performed           LYMPHEDEMA/ONCOLOGY QUESTIONNAIRE - 07/30/16 1636      Type   Cancer Type Right breast cancer     Lymphedema Assessments   Lymphedema Assessments Upper extremities     Right Upper Extremity Lymphedema   10 cm Proximal to Olecranon Process 26.8 cm   Olecranon Process 24.4 cm   10 cm Proximal to Ulnar Styloid Process 20.6 cm   Just Proximal to Ulnar Styloid Process 15.3 cm   Across Hand at [employer] 18.4 cm   At [place] of 2nd Digit 6.3 cm     Left Upper Extremity Lymphedema   10 cm Proximal to Olecranon Process 26.6 cm   Olecranon Process 23.7 cm   10 cm Proximal to Ulnar Styloid Process 20 cm   Just Proximal to Ulnar Styloid Process 15.1 cm   Across Hand at [employer] 18.8 cm   At [place] of 2nd Digit 6.3 cm         Objective measurements completed on examination: See above findings.     Patient was instructed today in a home exercise program today for post op shoulder range of motion. These included active assist shoulder flexion in sitting, scapular retraction, wall walking with shoulder abduction, and hands behind head external rotation.  She was encouraged to do these twice a day, holding 3 seconds and repeating 5 times when permitted by her physician.                 PT Education - 07/30/16 1636    Education provided Yes   Education Details Lymphedema risk reduction and post op shoulder ROM HEP   Person(s) Educated Patient;Spouse   Methods Explanation;Demonstration;Handout   Comprehension Returned demonstration;Verbalized understanding              Breast Clinic Goals - 07/30/16 1639      Patient will be able to verbalize understanding of pertinent lymphedema risk reduction practices relevant to her diagnosis specifically related to skin care.   Time 1   Period Days   Status Achieved     Patient will be able to return  demonstrate and/or verbalize understanding of the post-op home exercise program related to regaining shoulder range of motion.   Time 1   Period Days   Status Achieved     Patient will be able to verbalize understanding of the importance of attending the postoperative After Breast Cancer Class for further lymphedema risk reduction  education and therapeutic exercise.   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 It measures 8 mm and is located in the upper outer quadrant. It is ER/PR positive and HER2 negative with a Ki67 of 5%. Her multidisciplinary medical team met prior to her assessments to determine a recommended treatment plan. She is planning to have a right lumpectomy and sentinel node biopsy followed by Oncotype testing, radiation and anti-estrogen therapy. She may benefit from post op PT to regain shoulder ROM and reduce lympehdema risk.   History and Personal Factors relevant to plan of care: none   Clinical Presentation Stable   Clinical Presentation due to: Condition is stable   Clinical Decision Making Low   Rehab Potential Excellent   Clinical Impairments Affecting Rehab Potential none   PT Frequency One time visit   PT Treatment/Interventions Patient/family education;Therapeutic exercise   PT Next Visit Plan Will f/u after surgery to determine PT needs   PT Home Exercise Plan Post op shoulder ROM HEP   Consulted and Agree with Plan of Care Patient;Family member/caregiver   Family Member Consulted Husband      Patient will benefit from skilled therapeutic intervention in order to improve the following deficits and impairments:  Postural dysfunction, Decreased knowledge of precautions, Pain, Impaired UE functional use, Decreased range of motion  Visit Diagnosis: Carcinoma of upper-outer quadrant of right breast  in female, estrogen receptor positive ([place]) - Plan: PT plan of care cert/re-cert  Abnormal posture - Plan: PT plan of care cert/re-cert      G-Codes - 63/87/56 1639    Functional Assessment Tool Used (Outpatient Only) Clinical Judgement   Functional Limitation Other PT primary   Other PT Primary Current Status (E3329) At least 1 percent but less than 20 percent impaired, limited or restricted   Other PT Primary Goal Status (J1884) At least 1 percent but less than 20 percent impaired, limited or restricted   Other PT Primary Discharge Status (Z6606) At least 1 percent but less than 20 percent impaired, limited or restricted     Patient will follow up at outpatient cancer rehab if needed following surgery.  If the patient requires physical therapy at that time, a specific plan will be dictated and sent to the referring physician for approval. The patient was educated today on appropriate basic range of motion exercises to begin post operatively and the importance of attending the After Breast Cancer class following surgery.  Patient was educated today on lymphedema risk reduction practices as it pertains to recommendations that will benefit the patient immediately following surgery.  She verbalized good understanding.  No additional physical therapy is indicated at this time.     Problem List Patient Active Problem List   Diagnosis Date Noted  . Malignant neoplasm of upper-outer quadrant of right breast in female, estrogen receptor positive ([place]) 07/24/2016  . Spinal stenosis of lumbar region with neurogenic claudication 03/23/2013    Annia Friendly, PT 07/30/16 4:41 PM  [place], [place], [address] Phone: [phone]   Fax:  [phone]  Name: Holly Best MRN: [id number] Date of Birth: 13-Nov-1950

## 2016-07-30 NOTE — Progress Notes (Signed)
Radiation Oncology         [phone]) [phone] ________________________________  Initial outpatient Consultation  Name: [name] MRN: [id number]  Date: 07/30/2016  DOB: Jun 02, 1950  JQ:[id number], Silvestre Moment, MD  Holly Kussmaul, MD   REFERRING PHYSICIAN: Autumn Messing III, MD  DIAGNOSIS:    ICD-10-CM   1. 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  HISTORY OF PRESENT ILLNESS::Holly Best is a 66 y.o. female who presented with right breast cancer on routine mammography; she did not have a palpable a mass.  Biopsy showed invasive ductal carcinoma; the mass was appreciated on [place] at the 11:00 o'clock position measuring 38m.  Tissue from biopsy showed characteristics as described above in the diagnosis. Patient presents today with her husband.   She is in her USOH otherwise.   PREVIOUS RADIATION THERAPY: No  PAST MEDICAL HISTORY:  has a past medical history of Crohn disease ([place]; Depression; and Hypercholesteremia.    PAST SURGICAL HISTORY: Past Surgical History:  Procedure Laterality Date  . dental implants     upper  . LUMBAR LAMINECTOMY Bilateral 03/23/2013   Procedure: MICRO LUMBAR DECOMPRESSION, FORAMINOTOMY L4-5;  Surgeon: JJohnn Hai MD;  Location: [facility];  Service: Orthopedics;  Laterality: Bilateral;    FAMILY HISTORY: family history includes Breast cancer (age of onset: 349 in her sister; Breast cancer (age of onset: 534 in her other; Lung cancer in her father and mother.  SOCIAL HISTORY:  reports that she quit smoking about 38 years ago. Her smoking use included Cigarettes. She has a 10.00 pack-year smoking history. She has never used smokeless tobacco. She reports that she drinks alcohol. She reports that she does not use  drugs.  ALLERGIES: Patient has no known allergies.  MEDICATIONS:  Current Outpatient Prescriptions  Medication Sig Dispense Refill  . aspirin EC 81 MG tablet Take 81 mg by mouth daily.    .Marland Kitchenatorvastatin (LIPITOR) 10 MG tablet Take 5 mg by mouth 3 (three) times a week.    . calcium carbonate (TUMS - DOSED IN MG ELEMENTAL CALCIUM) 500 MG chewable tablet Chew 1,000 mg by mouth at bedtime.     . docusate sodium (COLACE) 100 MG capsule Take 1 capsule (100 mg total) by mouth 2 (two) times daily. (Patient not taking: Reported on 07/30/2016) 30 capsule 0  . ezetimibe (ZETIA) 10 MG tablet Take 10 mg by mouth at bedtime.    .Marland KitchenLORazepam (ATIVAN) 1 MG tablet Take 1 mg by mouth at bedtime.    . methocarbamol (ROBAXIN) 500 MG tablet Take 1 tablet (500 mg total) by mouth 3 (three) times daily. (Patient not taking: Reported on 07/30/2016) 40 tablet 1  . naproxen (NAPROSYN) 500 MG tablet Take 500 mg by mouth 2 (two) times daily as needed.     .Marland KitchenoxyCODONE-acetaminophen (PERCOCET) 7.5-325 MG per tablet Take 1-2 tablets by mouth every 4 (four) hours as needed for pain. 50 tablet 0  . PARoxetine (PAXIL) 20 MG tablet Take 20 mg by mouth every evening.     No current facility-administered medications for this encounter.     REVIEW OF SYSTEMS: A 10+ POINT REVIEW OF SYSTEMS WAS OBTAINED including neurology, dermatology, psychiatry, cardiac, respiratory, lymph, extremities, GI, GU, Musculoskeletal, constitutional, breasts, reproductive,  HEENT.  All pertinent positives are noted in the HPI.  All others are negative, expect patient is positive for back pain that is described as stabbling, hearing loss, dribbling urine, and is using steroids. Patient wears glasses.    PHYSICAL EXAM:  Vitals - 1 value per visit 3/56/8616  SYSTOLIC 837  DIASTOLIC 73  Pulse 72  Temperature 98.2  Respirations 18  Weight (lb) 159  Height 5' 6"   BMI 25.66  VISIT REPORT    General: Alert and oriented, in no acute distress HEENT: Head  is normocephalic. Extraocular movements are intact. Oropharynx is clear. Neck: Neck is supple, no palpable cervical or supraclavicular lymphadenopathy. Heart: Regular in rate and rhythm with no murmurs, rubs, or gallops. Chest: Clear to auscultation bilaterally, with no rhonchi, wheezes, or rales. Abdomen: Soft, nontender, nondistended, with no rigidity or guarding. Extremities: No cyanosis or edema. Lymphatics: see Neck Exam Skin: No concerning lesions. Musculoskeletal: symmetric strength and muscle tone throughout. Neurologic: Cranial nerves II through XII are grossly intact. No obvious focalities. Speech is fluent. Coordination is intact. Psychiatric: Judgment and insight are intact. Affect is appropriate. Breasts:Right breast 9 o'clock, tender but hard to feel obvious tumor; with no palpable right axillary lesions. No other palpable masses appreciated in the breasts or axillae.  ECOG = 0  0 - Asymptomatic (Fully active, able to carry on all predisease activities without restriction)  1 - Symptomatic but completely ambulatory (Restricted in physically strenuous activity but ambulatory and able to carry out work of a light or sedentary nature. For example, light housework, office work)  2 - Symptomatic, <50% in bed during the day (Ambulatory and capable of all self care but unable to carry out any work activities. Up and about more than 50% of waking hours)  3 - Symptomatic, >50% in bed, but not bedbound (Capable of only limited self-care, confined to bed or chair 50% or more of waking hours)  4 - Bedbound (Completely disabled. Cannot carry on any self-care. Totally confined to bed or chair)  5 - Death   Eustace Pen MM, Creech RH, Tormey DC, et al. [phone]). "Toxicity and response criteria of the [facility]". [place] Oncol. 5 (6): 649-55   LABORATORY DATA:  Lab Results  Component Value Date   WBC 6.3 07/30/2016   HGB 12.2 07/30/2016   HCT 36.3 07/30/2016    MCV 96.0 07/30/2016   PLT 318 07/30/2016   CMP     Component Value Date/Time   NA 143 07/30/2016 1221   K 4.4 07/30/2016 1221   CL 102 03/21/2013 1325   CO2 26 07/30/2016 1221   GLUCOSE 91 07/30/2016 1221   BUN 16.0 07/30/2016 1221   CREATININE 1.0 07/30/2016 1221   CALCIUM 9.0 07/30/2016 1221   PROT 6.5 07/30/2016 1221   ALBUMIN 3.9 07/30/2016 1221   AST 15 07/30/2016 1221   ALT 11 07/30/2016 1221   ALKPHOS 81 07/30/2016 1221   BILITOT 0.65 07/30/2016 1221   GFRNONAA 76 (L) 03/21/2013 1325   GFRAA 88 (L) 03/21/2013 1325         RADIOGRAPHY: US Breast Ltd Uni Right Inc Axilla  Result Date: 07/17/2016 CLINICAL DATA:  Possible mass in the upper-outer quadrant of the right breast and calcifications in the lower inner quadrant of the left breast on a recent screening mammogram. EXAM: 2D DIGITAL DIAGNOSTIC BILATERAL MAMMOGRAM WITH CAD AND ADJUNCT TOMO ULTRASOUND RIGHT BREAST COMPARISON:  Previous exam(s). ACR Breast Density Category c: The breast tissue is  heterogeneously dense, which may obscure small masses. FINDINGS: True lateral and spot magnification views of the left breast demonstrate an 18 x 12 x 4 mm group of heterogeneous, coarse calcifications in the lower inner quadrant of the breast in the middle third. These include coral-like calcifications. 2D and 3D spot compression views of the right breast confirm a small, irregular, spiculated mass in the upper-outer quadrant in the middle third. Mammographic images were processed with CAD. On physical exam, there is an approximately 2.5 cm rounded area of palpable soft tissue thickening in the 10:30 - 11 o'clock position of the right breast, 6 cm from the nipple. There are no palpable right axillary lymph nodes. Targeted ultrasound is performed, showing an irregular, markedly hypoechoic mass with ill-defined peripheral mildly increased echogenicity measuring approximately 8 x 8 x 8 mm in maximum dimensions in the 10:30 - 11 o'clock  position of the right breast, 6 cm from the nipple. This corresponds to the mammographic mass. Ultrasound of the right axilla demonstrated normal appearing right axillary lymph nodes. IMPRESSION: 1. 8 mm mass in the 10:30 - 11 o'clock position of the right breast with imaging features highly suspicious for malignancy. 2. Benign calcifications in the lower inner quadrant of the left breast, compatible with calcifications associated with fibroadenomatoid changes. RECOMMENDATION: Ultrasound-guided core needle biopsy of the 8 mm mass in the 10:30 - 11 o'clock position of the right breast. This has been discussed with the patient and scheduled at 7:30 a.m. on 07/22/2016. I have discussed the findings and recommendations with the patient. Results were also provided in writing at the conclusion of the visit. If applicable, a reminder letter will be sent to the patient regarding the next appointment. BI-RADS CATEGORY  5: Highly suggestive of malignancy. Electronically Signed   By: Claudie Revering M.D.   On: 07/17/2016 17:04   Mm Diag Breast Tomo Bilateral  Result Date: 07/17/2016 CLINICAL DATA:  Possible mass in the upper-outer quadrant of the right breast and calcifications in the lower inner quadrant of the left breast on a recent screening mammogram. EXAM: 2D DIGITAL DIAGNOSTIC BILATERAL MAMMOGRAM WITH CAD AND ADJUNCT TOMO ULTRASOUND RIGHT BREAST COMPARISON:  Previous exam(s). ACR Breast Density Category c: The breast tissue is heterogeneously dense, which may obscure small masses. FINDINGS: True lateral and spot magnification views of the left breast demonstrate an 18 x 12 x 4 mm group of heterogeneous, coarse calcifications in the lower inner quadrant of the breast in the middle third. These include coral-like calcifications. 2D and 3D spot compression views of the right breast confirm a small, irregular, spiculated mass in the upper-outer quadrant in the middle third. Mammographic images were processed with CAD. On  physical exam, there is an approximately 2.5 cm rounded area of palpable soft tissue thickening in the 10:30 - 11 o'clock position of the right breast, 6 cm from the nipple. There are no palpable right axillary lymph nodes. Targeted ultrasound is performed, showing an irregular, markedly hypoechoic mass with ill-defined peripheral mildly increased echogenicity measuring approximately 8 x 8 x 8 mm in maximum dimensions in the 10:30 - 11 o'clock position of the right breast, 6 cm from the nipple. This corresponds to the mammographic mass. Ultrasound of the right axilla demonstrated normal appearing right axillary lymph nodes. IMPRESSION: 1. 8 mm mass in the 10:30 - 11 o'clock position of the right breast with imaging features highly suspicious for malignancy. 2. Benign calcifications in the lower inner quadrant of the left breast, compatible with calcifications associated  with fibroadenomatoid changes. RECOMMENDATION: Ultrasound-guided core needle biopsy of the 8 mm mass in the 10:30 - 11 o'clock position of the right breast. This has been discussed with the patient and scheduled at 7:30 a.m. on 07/22/2016. I have discussed the findings and recommendations with the patient. Results were also provided in writing at the conclusion of the visit. If applicable, a reminder letter will be sent to the patient regarding the next appointment. BI-RADS CATEGORY  5: Highly suggestive of malignancy. Electronically Signed   By: Claudie Revering M.D.   On: 07/17/2016 17:04   Mm Clip Placement Right  Result Date: 07/22/2016 CLINICAL DATA:  Status post ultrasound-guided core biopsy of right breast mass. EXAM: DIAGNOSTIC RIGHT MAMMOGRAM POST ULTRASOUND BIOPSY COMPARISON:  Previous exam(s). FINDINGS: Mammographic images were obtained following ultrasound guided biopsy of a mass in the upper-outer quadrant of the right breast. Mammographic images show there is a ribbon shaped clip in the upper-outer quadrant of the right breast in  appropriate position. IMPRESSION: Status post ultrasound-guided core biopsy of the right breast with pathology pending. Final Assessment: Post Procedure Mammograms for Marker Placement Electronically Signed   By: Lillia Mountain M.D.   On: 07/22/2016 08:29   [place] Rt Breast Bx W Loc Dev 1st Lesion Img Bx Spec US Guide  Addendum Date: 07/28/2016   ADDENDUM REPORT: 07/24/2016 14:22 ADDENDUM: Pathology revealed GRADE I INVASIVE DUCTAL CARCINOMA of the Right breast, 11:00 o'clock. This was found to be concordant by Dr. Lillia Mountain. Pathology results were discussed with the patient by telephone. The patient reported doing well after the biopsy with tenderness at the site. Post biopsy instructions and care were reviewed and questions were answered. The patient was encouraged to call [facility] for any additional concerns. The patient was referred to [facility] on July 30, 2016. Pathology results reported by Terie Purser, RN on 07/24/2016. Electronically Signed   By: Lillia Mountain M.D.   On: 07/24/2016 14:22   Result Date: 07/28/2016 CLINICAL DATA:  Suspicious right breast mass. EXAM: ULTRASOUND GUIDED RIGHT BREAST CORE NEEDLE BIOPSY COMPARISON:  Previous exam(s). FINDINGS: I met with the patient and we discussed the procedure of ultrasound-guided biopsy, including benefits and alternatives. We discussed the high likelihood of a successful procedure. We discussed the risks of the procedure, including infection, bleeding, tissue injury, clip migration, and inadequate sampling. Informed written consent was given. The usual time-out protocol was performed immediately prior to the procedure. Lesion quadrant: Upper-outer quadrant. Using sterile technique and 1% Lidocaine as local anesthetic, under direct ultrasound visualization, a 14 gauge spring-loaded device was used to perform biopsy of a mass in the 11 o'clock region of the  right breast using an inferior to superior approach. At the conclusion of the procedure a ribbon shaped tissue marker clip was deployed into the biopsy cavity. Follow up 2 view mammogram was performed and dictated separately. IMPRESSION: Ultrasound guided biopsy of the right breast. No apparent complications. Electronically Signed: By: Lillia Mountain M.D. On: 07/22/2016 08:11      IMPRESSION/PLAN: Stage I Right breast cancer  She has been discussed at our multidisciplinary tumor board.  The consensus is that she would be a good candidate for breast conservation. I talked to her about the option of a mastectomy and informed her that her expected overall survival would be equivalent between mastectomy and breast conservation, based upon randomized controlled data. She is enthusiastic about breast conservation.  It was a pleasure meeting the patient today. We discussed the risks, benefits, and side effects of radiotherapy. I recommend radiotherapy to the right breast to reduce her risk of locoregional recurrence by 2/3.  We discussed that radiation would take approximately 3-4 weeks to complete and that I would give the patient a few weeks to heal following surgery before starting treatment planning. If chemotherapy were to be given, this would precede radiotherapy. We spoke about acute effects including skin irritation and fatigue as well as much less common late effects including internal organ injury or irritation. We spoke about the latest technology that is used to minimize the risk of late effects for patients undergoing radiotherapy to the breast or chest wall. No guarantees of treatment were given. The patient is enthusiastic about proceeding with treatment. I look forward to participating in the patient's care.  I will await her referral back to me for postoperative follow-up and eventual CT simulation/treatment planning.    Eppie Gibson, MD This document serves as a record of services personally  performed by Eppie Gibson, MD. It was created on her behalf by Valeta Harms, a trained medical scribe. The creation of this record is based on the scribe's personal observations and the provider's statements to them. This document has been checked and approved by the attending provider.

## 2016-07-30 NOTE — Progress Notes (Signed)
[place] Psychosocial Distress Screening Spiritual Care  Counselor met with patient and her husband in [facility] to introduce [facility] team/resources, reviewing distress screen per protocol. The patient scored a 4/10 on the Psychosocial Distress Thermometer which indicates Moderate distress. Also assessed for distress and other psychosocial needs.   Counselor inquired after patient's emotional state; patient and husband stated that due to husband's experience with lukemia twenty years prior, they feel "steady.. Not very anxious." Patient stated that she feels quite confident in her multidisciplinary support team, and reiterated the team's kindness and patience with her. Patient stated that while she feels some anxiety, she is confident in herself and her support team to help her "beat this." Counselor explored support programming with patient, who seemed interested in participating in gardening classes and [facility] mentor programming. Patient knows to call support staff if any needs arise.  Follow up needed: No.  ONCBCN DISTRESS SCREENING 07/30/2016  Screening Type Initial Screening  Distress experienced in past week (1-10) 4  Emotional problem type Nervousness/Anxiety;Adjusting to illness  Information Concerns Type Lack of info about diagnosis  Referral to support programs Yes   Westly Pam, [place] [facility] [place]

## 2016-07-31 ENCOUNTER — Other Ambulatory Visit: Payer: Self-pay | Admitting: General Surgery

## 2016-07-31 DIAGNOSIS — Z170 Estrogen receptor positive status [ER+]: Principal | ICD-10-CM

## 2016-07-31 DIAGNOSIS — C50411 Malignant neoplasm of upper-outer quadrant of right female breast: Secondary | ICD-10-CM

## 2016-08-01 ENCOUNTER — Ambulatory Visit
Admission: RE | Admit: 2016-08-01 | Discharge: 2016-08-01 | Disposition: A | Discharge status: Home / Self Care | Payer: Federal, State, Local not specified - PPO | Source: Ambulatory Visit | Attending: General Surgery | Admitting: General Surgery

## 2016-08-01 ENCOUNTER — Telehealth: Payer: Self-pay | Admitting: *Deleted

## 2016-08-01 DIAGNOSIS — C50411 Malignant neoplasm of upper-outer quadrant of right female breast: Secondary | ICD-10-CM

## 2016-08-01 DIAGNOSIS — Z170 Estrogen receptor positive status [ER+]: Principal | ICD-10-CM

## 2016-08-01 NOTE — Telephone Encounter (Signed)
Spoke to pt concerning [place] from 7.25.18. Denies questions or concerns regarding dx or treatment care plan. Encourage pt to call with needs. Received verbal understanding.

## 2016-08-05 ENCOUNTER — Encounter (HOSPITAL_COMMUNITY): Payer: Self-pay | Admitting: *Deleted

## 2016-08-05 NOTE — Progress Notes (Signed)
Pt denies SOB, chest pain, and being under the care of a cardiologist. Pt denies having a stress test, echo and cardiac cath. Pt denies having an EKG and chest x ray within the last year. Pt made aware to stop  taking Aspirin, vitamins, fish oil, and herbal medications. Do not take any NSAIDs ie: Ibuprofen, Advil, Naproxen ( Naprosyn/Aleve), Motrin, BC and Goody Powder or any medication containing Aspirin. Pt verbalized understanding of all pre-op instructions.

## 2016-08-06 ENCOUNTER — Ambulatory Visit (HOSPITAL_COMMUNITY): Payer: Federal, State, Local not specified - PPO

## 2016-08-06 ENCOUNTER — Ambulatory Visit
Admission: RE | Admit: 2016-08-06 | Discharge: 2016-08-06 | Disposition: A | Discharge status: Home / Self Care | Payer: Federal, State, Local not specified - PPO | Source: Ambulatory Visit | Attending: General Surgery | Admitting: General Surgery

## 2016-08-06 ENCOUNTER — Encounter (HOSPITAL_COMMUNITY)
Admission: RE | Disposition: A | Discharge status: Home / Self Care | Payer: Self-pay | Source: Ambulatory Visit | Attending: General Surgery

## 2016-08-06 ENCOUNTER — Ambulatory Visit (HOSPITAL_COMMUNITY)
Admission: RE | Admit: 2016-08-06 | Discharge: 2016-08-06 | Disposition: A | Discharge status: Home / Self Care | Payer: Federal, State, Local not specified - PPO | Source: Ambulatory Visit | Attending: General Surgery | Admitting: General Surgery

## 2016-08-06 ENCOUNTER — Encounter (HOSPITAL_COMMUNITY): Payer: Self-pay | Admitting: *Deleted

## 2016-08-06 ENCOUNTER — Encounter (HOSPITAL_COMMUNITY)
Admission: RE | Admit: 2016-08-06 | Discharge: 2016-08-06 | Disposition: A | Discharge status: Home / Self Care | Payer: Federal, State, Local not specified - PPO | Source: Ambulatory Visit | Attending: General Surgery | Admitting: General Surgery

## 2016-08-06 DIAGNOSIS — F419 Anxiety disorder, unspecified: Secondary | ICD-10-CM | POA: Diagnosis not present

## 2016-08-06 DIAGNOSIS — Z170 Estrogen receptor positive status [ER+]: Principal | ICD-10-CM

## 2016-08-06 DIAGNOSIS — C50411 Malignant neoplasm of upper-outer quadrant of right female breast: Secondary | ICD-10-CM

## 2016-08-06 DIAGNOSIS — E7800 Pure hypercholesterolemia, unspecified: Secondary | ICD-10-CM | POA: Diagnosis not present

## 2016-08-06 DIAGNOSIS — K5090 Crohn's disease, unspecified, without complications: Secondary | ICD-10-CM | POA: Insufficient documentation

## 2016-08-06 DIAGNOSIS — Z87891 Personal history of nicotine dependence: Secondary | ICD-10-CM | POA: Insufficient documentation

## 2016-08-06 HISTORY — PX: BREAST LUMPECTOMY WITH RADIOACTIVE SEED AND SENTINEL LYMPH NODE BIOPSY: SHX6550

## 2016-08-06 HISTORY — DX: Malignant (primary) neoplasm, unspecified: C80.1

## 2016-08-06 SURGERY — BREAST LUMPECTOMY WITH RADIOACTIVE SEED AND SENTINEL LYMPH NODE BIOPSY
Anesthesia: General | Site: Breast | Laterality: Right

## 2016-08-06 MED ORDER — HYDROMORPHONE HCL 1 MG/ML IJ SOLN
INTRAMUSCULAR | Status: AC
  Administered 2016-08-06: 0.25 mg via INTRAVENOUS
  Filled 2016-08-06: qty 1

## 2016-08-06 MED ORDER — MIDAZOLAM HCL 5 MG/5ML IJ SOLN
INTRAMUSCULAR | Status: DC | PRN
  Administered 2016-08-06: 2 mg via INTRAVENOUS

## 2016-08-06 MED ORDER — METHYLENE BLUE 0.5 % INJ SOLN
INTRAVENOUS | Status: AC
  Filled 2016-08-06: qty 10

## 2016-08-06 MED ORDER — EPHEDRINE 5 MG/ML INJ
INTRAVENOUS | Status: AC
  Filled 2016-08-06: qty 10

## 2016-08-06 MED ORDER — CHLORHEXIDINE GLUCONATE CLOTH 2 % EX PADS: 6.0000 | MEDICATED_PAD | Freq: Once | CUTANEOUS | Status: DC

## 2016-08-06 MED ORDER — PROPOFOL 10 MG/ML IV BOLUS
INTRAVENOUS | Status: AC
  Filled 2016-08-06: qty 20

## 2016-08-06 MED ORDER — LIDOCAINE HCL (CARDIAC) 20 MG/ML IV SOLN
INTRAVENOUS | Status: DC | PRN
  Administered 2016-08-06: 80 mg via INTRAVENOUS

## 2016-08-06 MED ORDER — HYDROMORPHONE HCL 1 MG/ML IJ SOLN
0.2500 mg | INTRAMUSCULAR | Status: DC | PRN
  Administered 2016-08-06: 0.25 mg via INTRAVENOUS

## 2016-08-06 MED ORDER — TECHNETIUM TC 99M SULFUR COLLOID FILTERED
1.0000 | Freq: Once | INTRAVENOUS | Status: AC | PRN
  Administered 2016-08-06: 1 via INTRADERMAL

## 2016-08-06 MED ORDER — ONDANSETRON HCL 4 MG/2ML IJ SOLN
INTRAMUSCULAR | Status: DC | PRN
  Administered 2016-08-06: 4 mg via INTRAVENOUS

## 2016-08-06 MED ORDER — ACETAMINOPHEN 500 MG PO TABS
1000.0000 mg | ORAL_TABLET | ORAL | Status: AC
  Administered 2016-08-06: 1000 mg via ORAL
  Filled 2016-08-06: qty 2

## 2016-08-06 MED ORDER — MIDAZOLAM HCL 2 MG/2ML IJ SOLN
INTRAMUSCULAR | Status: AC
Start: 2016-08-06 — End: 2016-08-06
  Filled 2016-08-06: qty 2

## 2016-08-06 MED ORDER — FENTANYL CITRATE (PF) 250 MCG/5ML IJ SOLN
INTRAMUSCULAR | Status: AC
  Filled 2016-08-06: qty 5

## 2016-08-06 MED ORDER — EPHEDRINE SULFATE 50 MG/ML IJ SOLN
INTRAMUSCULAR | Status: DC | PRN
  Administered 2016-08-06 (×2): 10 mg via INTRAVENOUS

## 2016-08-06 MED ORDER — LIDOCAINE 2% (20 MG/ML) 5 ML SYRINGE
INTRAMUSCULAR | Status: AC
  Filled 2016-08-06: qty 5

## 2016-08-06 MED ORDER — CEFAZOLIN SODIUM-DEXTROSE 2-4 GM/100ML-% IV SOLN
2.0000 g | INTRAVENOUS | Status: AC
  Administered 2016-08-06: 2 g via INTRAVENOUS
  Filled 2016-08-06: qty 100

## 2016-08-06 MED ORDER — ROCURONIUM BROMIDE 10 MG/ML (PF) SYRINGE
PREFILLED_SYRINGE | INTRAVENOUS | Status: AC
  Filled 2016-08-06: qty 5

## 2016-08-06 MED ORDER — 0.9 % SODIUM CHLORIDE (POUR BTL) OPTIME
TOPICAL | Status: DC | PRN
  Administered 2016-08-06: 1000 mL

## 2016-08-06 MED ORDER — PROPOFOL 10 MG/ML IV BOLUS
INTRAVENOUS | Status: DC | PRN
  Administered 2016-08-06 (×2): 50 mg via INTRAVENOUS
  Administered 2016-08-06: 150 mg via INTRAVENOUS

## 2016-08-06 MED ORDER — DEXAMETHASONE SODIUM PHOSPHATE 10 MG/ML IJ SOLN
INTRAMUSCULAR | Status: AC
Start: 2016-08-06 — End: 2016-08-06
  Filled 2016-08-06: qty 1

## 2016-08-06 MED ORDER — BUPIVACAINE-EPINEPHRINE (PF) 0.25% -1:200000 IJ SOLN
INTRAMUSCULAR | Status: AC
  Filled 2016-08-06: qty 30

## 2016-08-06 MED ORDER — PROPOFOL 10 MG/ML IV BOLUS
INTRAVENOUS | Status: AC
Start: 2016-08-06 — End: 2016-08-06
  Filled 2016-08-06: qty 20

## 2016-08-06 MED ORDER — HYDROCODONE-ACETAMINOPHEN 5-325 MG PO TABS: 1.0000 | ORAL_TABLET | ORAL | 0 refills | Status: DC | PRN

## 2016-08-06 MED ORDER — LACTATED RINGERS IV SOLN
INTRAVENOUS | Status: DC | PRN
  Administered 2016-08-06 (×2): via INTRAVENOUS

## 2016-08-06 MED ORDER — PHENYLEPHRINE 40 MCG/ML (10ML) SYRINGE FOR IV PUSH (FOR BLOOD PRESSURE SUPPORT)
PREFILLED_SYRINGE | INTRAVENOUS | Status: AC
  Filled 2016-08-06: qty 10

## 2016-08-06 MED ORDER — SODIUM CHLORIDE 0.9 % IJ SOLN
INTRAMUSCULAR | Status: AC
  Filled 2016-08-06: qty 10

## 2016-08-06 MED ORDER — ONDANSETRON HCL 4 MG/2ML IJ SOLN
INTRAMUSCULAR | Status: AC
  Filled 2016-08-06: qty 2

## 2016-08-06 MED ORDER — DEXAMETHASONE SODIUM PHOSPHATE 10 MG/ML IJ SOLN
INTRAMUSCULAR | Status: DC | PRN
  Administered 2016-08-06: 10 mg via INTRAVENOUS

## 2016-08-06 MED ORDER — GLYCOPYRROLATE 0.2 MG/ML IJ SOLN
INTRAMUSCULAR | Status: DC | PRN
  Administered 2016-08-06: 0.2 mg via INTRAVENOUS

## 2016-08-06 MED ORDER — FENTANYL CITRATE (PF) 100 MCG/2ML IJ SOLN
INTRAMUSCULAR | Status: DC | PRN
  Administered 2016-08-06: 50 ug via INTRAVENOUS
  Administered 2016-08-06: 100 ug via INTRAVENOUS
  Administered 2016-08-06 (×2): 50 ug via INTRAVENOUS

## 2016-08-06 MED ORDER — PHENYLEPHRINE HCL 10 MG/ML IJ SOLN
INTRAMUSCULAR | Status: DC | PRN
  Administered 2016-08-06: 80 ug via INTRAVENOUS
  Administered 2016-08-06: 40 ug via INTRAVENOUS

## 2016-08-06 MED ORDER — GABAPENTIN 300 MG PO CAPS
300.0000 mg | ORAL_CAPSULE | ORAL | Status: AC
  Administered 2016-08-06: 300 mg via ORAL
  Filled 2016-08-06: qty 1

## 2016-08-06 MED ORDER — CELECOXIB 200 MG PO CAPS
400.0000 mg | ORAL_CAPSULE | ORAL | Status: AC
  Administered 2016-08-06: 400 mg via ORAL
  Filled 2016-08-06: qty 2

## 2016-08-06 MED ORDER — BUPIVACAINE-EPINEPHRINE 0.25% -1:200000 IJ SOLN
INTRAMUSCULAR | Status: DC | PRN
  Administered 2016-08-06: 10 mL

## 2016-08-06 SURGICAL SUPPLY — 56 items
ADH SKN CLS APL DERMABOND .7 (GAUZE/BANDAGES/DRESSINGS) ×1
APPLIER CLIP 9.375 MED OPEN (MISCELLANEOUS) ×3
APR CLP MED 9.3 20 MLT OPN (MISCELLANEOUS) ×1
BINDER BREAST LRG (GAUZE/BANDAGES/DRESSINGS) ×2 IMPLANT
BLADE SURG 15 STRL LF DISP TIS (BLADE) ×1 IMPLANT
BLADE SURG 15 STRL SS (BLADE) ×3
CANISTER SUCT 3000ML PPV (MISCELLANEOUS) ×3 IMPLANT
CAP TITANIUM CLICK LOCK 3D (MISCELLANEOUS) ×2 IMPLANT
CHLORAPREP W/TINT 26ML (MISCELLANEOUS) ×3 IMPLANT
CLIP APPLIE 9.375 MED OPEN (MISCELLANEOUS) ×1 IMPLANT
CONT SPEC 4OZ CLIKSEAL STRL BL (MISCELLANEOUS) ×3 IMPLANT
CONT SPECI 4OZ STER CLIK (MISCELLANEOUS) ×8 IMPLANT
COVER PROBE W GEL 5X96 (DRAPES) ×3 IMPLANT
COVER SURGICAL LIGHT HANDLE (MISCELLANEOUS) ×3 IMPLANT
DECANTER SPIKE VIAL GLASS SM (MISCELLANEOUS) ×2 IMPLANT
DERMABOND ADVANCED (GAUZE/BANDAGES/DRESSINGS) ×2
DERMABOND ADVANCED .7 DNX12 (GAUZE/BANDAGES/DRESSINGS) ×1 IMPLANT
DRAPE CHEST BREAST 15X10 FENES (DRAPES) ×3 IMPLANT
DRAPE UTILITY XL STRL (DRAPES) ×3 IMPLANT
ELECT COATED BLADE 2.86 ST (ELECTRODE) ×3 IMPLANT
ELECT REM PT RETURN 9FT ADLT (ELECTROSURGICAL) ×3
ELECTRODE REM PT RTRN 9FT ADLT (ELECTROSURGICAL) ×1 IMPLANT
GAUZE SPONGE 4X4 12PLY STRL (GAUZE/BANDAGES/DRESSINGS) ×2 IMPLANT
GLOVE BIO SURGEON STRL SZ 6.5 (GLOVE) ×1 IMPLANT
GLOVE BIO SURGEON STRL SZ7.5 (GLOVE) ×3 IMPLANT
GLOVE BIO SURGEONS STRL SZ 6.5 (GLOVE) ×1
GLOVE BIOGEL PI IND STRL 6.5 (GLOVE) IMPLANT
GLOVE BIOGEL PI INDICATOR 6.5 (GLOVE) ×2
GOWN STRL REUS W/ TWL LRG LVL3 (GOWN DISPOSABLE) ×2 IMPLANT
GOWN STRL REUS W/TWL LRG LVL3 (GOWN DISPOSABLE) ×6
KIT BASIN OR (CUSTOM PROCEDURE TRAY) ×3 IMPLANT
KIT MARKER MARGIN INK (KITS) ×3 IMPLANT
LIGHT WAVEGUIDE WIDE FLAT (MISCELLANEOUS) ×2 IMPLANT
NDL FILTER BLUNT 18X1 1/2 (NEEDLE) IMPLANT
NDL HYPO 25GX1X1/2 BEV (NEEDLE) ×1 IMPLANT
NDL SAFETY ECLIPSE 18X1.5 (NEEDLE) IMPLANT
NEEDLE FILTER BLUNT 18X 1/2SAF (NEEDLE)
NEEDLE FILTER BLUNT 18X1 1/2 (NEEDLE) IMPLANT
NEEDLE HYPO 18GX1.5 SHARP (NEEDLE)
NEEDLE HYPO 25GX1X1/2 BEV (NEEDLE) ×3 IMPLANT
NS IRRIG 1000ML POUR BTL (IV SOLUTION) ×3 IMPLANT
PACK SURGICAL SETUP 50X90 (CUSTOM PROCEDURE TRAY) ×3 IMPLANT
PAD ABD 8X10 STRL (GAUZE/BANDAGES/DRESSINGS) ×2 IMPLANT
PENCIL BUTTON HOLSTER BLD 10FT (ELECTRODE) ×3 IMPLANT
SPONGE LAP 18X18 X RAY DECT (DISPOSABLE) ×3 IMPLANT
SUT MNCRL AB 4-0 PS2 18 (SUTURE) ×4 IMPLANT
SUT VIC AB 3-0 54X BRD REEL (SUTURE) IMPLANT
SUT VIC AB 3-0 BRD 54 (SUTURE) ×3
SUT VIC AB 3-0 SH 18 (SUTURE) ×3 IMPLANT
SYR BULB 3OZ (MISCELLANEOUS) ×3 IMPLANT
SYR CONTROL 10ML LL (SYRINGE) ×3 IMPLANT
TOWEL OR 17X24 6PK STRL BLUE (TOWEL DISPOSABLE) ×3 IMPLANT
TOWEL OR 17X26 10 PK STRL BLUE (TOWEL DISPOSABLE) ×3 IMPLANT
TUBE CONNECTING 12'X1/4 (SUCTIONS) ×1
TUBE CONNECTING 12X1/4 (SUCTIONS) ×2 IMPLANT
YANKAUER SUCT BULB TIP NO VENT (SUCTIONS) ×3 IMPLANT

## 2016-08-06 NOTE — Anesthesia Preprocedure Evaluation (Addendum)
Anesthesia Evaluation  Patient identified by MRN, date of birth, ID band Patient awake    Reviewed: Allergy & Precautions, NPO status , Patient's Chart, lab work & pertinent test results  Airway Mallampati: II  TM Distance: >3 FB     Dental  (+) Teeth Intact, Dental Advisory Given, Caps   Pulmonary former smoker,    breath sounds clear to auscultation       Cardiovascular negative cardio ROS   Rhythm:Regular Rate:Normal     Neuro/Psych    GI/Hepatic negative GI ROS, Neg liver ROS,   Endo/Other  negative endocrine ROS  Renal/GU negative Renal ROS     Musculoskeletal   Abdominal   Peds  Hematology   Anesthesia Other Findings   Reproductive/Obstetrics                            Anesthesia Physical Anesthesia Plan  ASA: III  Anesthesia Plan: General   Post-op Pain Management:    Induction: Intravenous  PONV Risk Score and Plan: 3 and Ondansetron, Dexamethasone, Midazolam, Propofol infusion and Treatment may vary due to age or medical condition  Airway Management Planned: LMA  Additional Equipment:   Intra-op Plan:   Post-operative Plan: Extubation in OR  Informed Consent: I have reviewed the patients History and Physical, chart, labs and discussed the procedure including the risks, benefits and alternatives for the proposed anesthesia with the patient or authorized representative who has indicated his/her understanding and acceptance.   Dental advisory given  Plan Discussed with: CRNA and Anesthesiologist  Anesthesia Plan Comments:       Anesthesia Quick Evaluation

## 2016-08-06 NOTE — Progress Notes (Signed)
Report given to [facility] as caregiver

## 2016-08-06 NOTE — Anesthesia Postprocedure Evaluation (Signed)
Anesthesia Post Note  Patient: Holly Best  Procedure(s) Performed: Procedure(s) (LRB): BREAST LUMPECTOMY WITH RADIOACTIVE SEED AND SENTINEL LYMPH NODE BIOPSY (Right)     Patient location during evaluation: PACU Anesthesia Type: General Level of consciousness: awake Pain management: pain level controlled Vital Signs Assessment: post-procedure vital signs reviewed and stable Respiratory status: spontaneous breathing Cardiovascular status: stable Anesthetic complications: no    Last Vitals:  Vitals:   08/06/16 1010 08/06/16 1015  BP: 132/86   Pulse: (!) 110 (!) 108  Resp: 16 15  Temp: (!) 36.1 C     Last Pain:  Vitals:   08/06/16 1010  TempSrc:   PainSc: 0-No pain                 Jex Strausbaugh

## 2016-08-06 NOTE — Anesthesia Procedure Notes (Signed)
Procedure Name: LMA Insertion Date/Time: 08/06/2016 8:44 AM Performed by: Luciana Axe K Pre-anesthesia Checklist: Patient identified, Emergency Drugs available, Suction available and Patient being monitored Patient Re-evaluated:Patient Re-evaluated prior to induction Oxygen Delivery Method: Circle System Utilized Preoxygenation: Pre-oxygenation with 100% oxygen Induction Type: IV induction Ventilation: Mask ventilation without difficulty and Oral airway inserted - appropriate to patient size LMA: LMA inserted LMA Size: 4.0 Number of attempts: 1 Placement Confirmation: positive ETCO2 Tube secured with: Tape Dental Injury: Teeth and Oropharynx as per pre-operative assessment  Comments: Performed by Prudence Davidson, SRNA

## 2016-08-06 NOTE — Anesthesia Postprocedure Evaluation (Signed)
Anesthesia Post Note  Patient: Holly Best  Procedure(s) Performed: Procedure(s) (LRB): BREAST LUMPECTOMY WITH RADIOACTIVE SEED AND SENTINEL LYMPH NODE BIOPSY (Right)     Patient location during evaluation: PACU Anesthesia Type: General Level of consciousness: awake Pain management: pain level controlled Vital Signs Assessment: post-procedure vital signs reviewed and stable Respiratory status: spontaneous breathing Cardiovascular status: stable Anesthetic complications: no    Last Vitals:  Vitals:   08/06/16 1149 08/06/16 1150  BP: 123/61 123/61  Pulse:    Resp:    Temp:      Last Pain:  Vitals:   08/06/16 1150  TempSrc:   PainSc: 0-No pain                 Aniyha Tate

## 2016-08-06 NOTE — Anesthesia Procedure Notes (Addendum)
Anesthesia Regional Block: Pectoralis block   Pre-Anesthetic Checklist: ,, timeout performed, Correct Patient, Correct Site, Correct Laterality, Correct Procedure, Correct Position, site marked, Risks and benefits discussed,  Surgical consent,  Pre-op evaluation,  At surgeon's request and post-op pain management  Laterality: Right  Prep: chloraprep       Needles:   Needle Type: Stimulator Needle - 40          Additional Needles:   Procedures: Doppler guided, Ultrasound guided,,,,,,  Narrative:  Start time: 08/06/2016 7:50 AM End time: 08/06/2016 8:05 AM Injection made incrementally with aspirations every 5 mL.  Performed by: Personally  Anesthesiologist: Belinda Block

## 2016-08-06 NOTE — Anesthesia Procedure Notes (Deleted)
Anesthesia Regional Block: Pectoralis block  Narrative:

## 2016-08-06 NOTE — Addendum Note (Signed)
Addendum  created 08/06/16 1649 by Belinda Block, MD   Anesthesia Intra Blocks edited, Child order released for a procedure order, Image imported, Sign clinical note

## 2016-08-06 NOTE — Interval H&P Note (Signed)
History and Physical Interval Note:  08/06/2016 8:08 AM  Holly Best  has presented today for surgery, with the diagnosis of RIGHT BREAST CANCER  The various methods of treatment have been discussed with the patient and family. After consideration of risks, benefits and other options for treatment, the patient has consented to  Procedure(s): BREAST LUMPECTOMY WITH RADIOACTIVE SEED AND SENTINEL LYMPH NODE BIOPSY (Right) as a surgical intervention .  The patient's history has been reviewed, patient examined, no change in status, stable for surgery.  I have reviewed the patient's chart and labs.  Questions were answered to the patient's satisfaction.     TOTH III,PAUL S

## 2016-08-06 NOTE — Op Note (Addendum)
08/06/2016  10:06 AM  PATIENT:  Holly Best  66 y.o. female  PRE-OPERATIVE DIAGNOSIS:  RIGHT BREAST CANCER  POST-OPERATIVE DIAGNOSIS:  RIGHT BREAST CANCER  PROCEDURE:  Procedure(s): BREAST LUMPECTOMY WITH RADIOACTIVE SEED AND DEEP RIGHT AXILLARY SENTINEL LYMPH NODE BIOPSY (Right)  SURGEON:  Surgeon(s) and Role:    * Jovita Kussmaul, MD - Primary  PHYSICIAN ASSISTANT:   ASSISTANTS: none   ANESTHESIA:   local and general  EBL:  Total I/O In: 1000 [I.V.:1000] Out: -   BLOOD ADMINISTERED:none  DRAINS: none   LOCAL MEDICATIONS USED:  MARCAINE     SPECIMEN:  Source of Specimen:  right breast tissue with additional inferior and medial margins and sentinel nodes X 3  DISPOSITION OF SPECIMEN:  PATHOLOGY  COUNTS:  YES  TOURNIQUET:  * No tourniquets in log *  DICTATION: .Dragon Dictation   After informed consent was obtained patient was brought to the operating room and placed in the supine position on the operating room table. After adequate induction of general anesthesia the patient's right chest, breast, and axillary area were prepped with ChloraPrep, allowed to dry, and draped in usual sterile manner. An appropriate timeout was performed. Previously an I-125 seed was placed in the upper outer quadrant of the right breast to mark an area of invasive breast cancer. Earlier in the day the patient also underwent injection of 1 mCi of technetium sulfur colloid in the subareolar position on the right. The neoprobe was initially set to I-125 in the area of radioactivity was readily identified and care. It was very high in the upper outer quadrant of the right breast. Because of its location and decided to do both the node evaluation and lumpectomy through the same incision. The area around the radioactivity in the axilla was infiltrated with quarter percent Marcaine. A curvilinear incision was made along the outer edge of the breast in the upper outer quadrant of the axilla. Incision  was carried through the skin and subcutaneous tissue sharply with electrocautery. Next the neoprobe was set to technetium. Dissection was carried from the incision into the deep right axillary space. The neoprobe was then used to direct blunt hemostat dissection. I was able to identify 3 hot lymph nodes. These were excised sharply with electrocautery and the lymphatics were controlled with clips and clamped with hemostats, divided, and ligated with 3-0 Vicryl ties. Ex vivo counts on these 3 sentinel nodes ranged from [phone]. No other hot or palpable lymph nodes were identified in the right axilla. Next the dissection was carried into the breast. The dissection was directed by the neoprobe which was now sent to I-125. Once I approach the radioactive seed then removed a circular portion of breast tissue around the radioactive seed. Once the specimen was removed it was oriented with the appropriate paint colors. A specimen radiograph showed the clip and seed to be near the center of the specimen. The dissection was carried all the way to the chest wall. I decided to take additional inferior and medial margins and these were taken sharply with electrocautery and marked appropriately. The cavity was then marked with clips. The wound was irrigated with saline and infiltrated with quarter percent Marcaine. The deep layer of the wound was then closed with layers of interrupted 3-0 Vicryl stitches. The skin was then closed with a running 4-0 Monocryl subcuticular stitch. Dermabond dressings and a breast binder were applied. The patient tolerated the procedure well. At the end of the case all needle sponge  and instrument counts were correct. The patient was then awakened and taken to recovery in stable condition.  PLAN OF CARE: Discharge to home after PACU  PATIENT DISPOSITION:  PACU - hemodynamically stable.   Delay start of Pharmacological VTE agent (>24hrs) due to surgical blood loss or risk of bleeding: not  applicable

## 2016-08-06 NOTE — H&P (Signed)
Holly Best  Location: [facility] Patient #: [id number] DOB: 1950/04/16 Unknown / Language: Holly Best / Race: White Female   History of Present Illness  The patient is a 66 year old female who presents with breast cancer. We are asked to see the patient in consultation by Dr. Isidore Moos to evaluate her for a new right breast cancer. The patient is a 66 year old white female who was found on recent mammogram to have a mass in the upper outer right breast. This measured 75m. 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 She had some benign appearing calcs in the left breast. She did have a sister that was diagnosed with breast cancer at the age of 322and passed away at 480 The axilla looked neg.   Past Surgical History  Breast Biopsy  Right. Oral Surgery  Spinal Surgery - Lower Back   Diagnostic Studies History Colonoscopy  within last year Mammogram  within last year Pap Smear  1-5 years ago  Medication History Medications Reconciled  Social History  Alcohol use  Moderate alcohol use. Caffeine use  Carbonated beverages, Coffee. No drug use  Tobacco use  Former smoker.  Family History  Alcohol Abuse  Father. Arthritis  Mother. Breast Cancer  Family Members In General, Sister. Cancer  Brother, Family Members In General. Cerebrovascular Accident  Brother. Heart Disease  Father. Heart disease in female family member before age 243 Migraine Headache  Son. Respiratory Condition  Father, Mother. Thyroid problems  Mother.  Pregnancy / Birth History  Age at menarche  89years. Age of menopause  [age] Contraceptive History  Contraceptive implant, Intrauterine device, Oral contraceptives. Gravida  1 Irregular periods  Maternal age  66-20Para  1  Other Problems Anxiety Disorder  Back Pain  Breast Cancer  Crohn's Disease  Hemorrhoids  Hypercholesterolemia   Lump In Breast     Review of Systems General Present- Night Sweats. Not Present- Appetite Loss, Chills, Fatigue, Fever, Weight Gain and Weight Loss. Skin Not Present- Change in Wart/Mole, Dryness, Hives, Jaundice, New Lesions, Non-Healing Wounds, Rash and Ulcer. HEENT Present- Hearing Loss, Ringing in the Ears and Wears glasses/contact lenses. Not Present- Earache, Hoarseness, Nose Bleed, Oral Ulcers, Seasonal Allergies, Sinus Pain, Sore Throat, Visual Disturbances and Yellow Eyes. Respiratory Present- Snoring. Not Present- Bloody sputum, Chronic Cough, Difficulty Breathing and Wheezing. Breast Present- Breast Pain. Not Present- Breast Mass, Nipple Discharge and Skin Changes. Cardiovascular Present- Leg Cramps. Not Present- Chest Pain, Difficulty Breathing Lying Down, Palpitations, Rapid Heart Rate, Shortness of Breath and Swelling of Extremities. Gastrointestinal Present- Bloating, Indigestion and Nausea. Not Present- Abdominal Pain, Bloody Stool, Change in Bowel Habits, Chronic diarrhea, Constipation, Difficulty Swallowing, Excessive gas, Gets full quickly at meals, Hemorrhoids, Rectal Pain and Vomiting. Female Genitourinary Present- Frequency and Urgency. Not Present- Nocturia, Painful Urination and Pelvic Pain. Musculoskeletal Present- Back Pain and Joint Stiffness. Not Present- Joint Pain, Muscle Pain, Muscle Weakness and Swelling of Extremities. Neurological Not Present- Decreased Memory, Fainting, Headaches, Numbness, Seizures, Tingling, Tremor, Trouble walking and Weakness. Psychiatric Present- Anxiety. Not Present- Bipolar, Change in Sleep Pattern, Depression, Fearful and Frequent crying. Hematology Present- Blood Thinners and Easy Bruising. Not Present- Excessive bleeding, Gland problems, HIV and Persistent Infections.   Physical Exam  General Mental Status-Alert. General Appearance-Consistent with stated age. Hydration-Well hydrated. Voice-Normal.  Head and  Neck Head-normocephalic, atraumatic with no lesions or palpable  masses. Trachea-midline. Thyroid Gland Characteristics - normal size and consistency.  Eye Eyeball - Bilateral-Extraocular movements intact. Sclera/Conjunctiva - Bilateral-No scleral icterus.  Chest and Lung Exam Chest and lung exam reveals -quiet, even and easy respiratory effort with no use of accessory muscles and on auscultation, normal breath sounds, no adventitious sounds and normal vocal resonance. Inspection Chest Wall - Normal. Back - normal.  Breast Note: There is no palpable mass in either breast. There is no palpable axillary, supraclavicular, or cervical lymphadenopathy   Cardiovascular Cardiovascular examination reveals -normal heart sounds, regular rate and rhythm with no murmurs and normal pedal pulses bilaterally.  Abdomen Inspection Inspection of the abdomen reveals - No Hernias. Skin - Scar - no surgical scars. Palpation/Percussion Palpation and Percussion of the abdomen reveal - Soft, Non Tender, No Rebound tenderness, No Rigidity (guarding) and No hepatosplenomegaly. Auscultation Auscultation of the abdomen reveals - Bowel sounds normal.  Neurologic Neurologic evaluation reveals -alert and oriented x 3 with no impairment of recent or remote memory. Mental Status-Normal.  Musculoskeletal Normal Exam - Left-Upper Extremity Strength Normal and Lower Extremity Strength Normal. Normal Exam - Right-Upper Extremity Strength Normal and Lower Extremity Strength Normal.  Lymphatic Head & Neck  General Head & Neck Lymphatics: Bilateral - Description - Normal. Axillary  General Axillary Region: Bilateral - Description - Normal. Tenderness - Non Tender. Femoral & Inguinal  Generalized Femoral & Inguinal Lymphatics: Bilateral - Description - Normal. Tenderness - Non Tender.    Assessment & Plan  MALIGNANT NEOPLASM OF UPPER-OUTER QUADRANT OF RIGHT BREAST IN FEMALE, ESTROGEN  RECEPTOR POSITIVE (C50.411) Impression: The patient appears to have a small stage I cancer in the upper outer right breast. I have discussed with her the different options for treatment and at this point she favors breast conservation. She is also a good candidate for sentinel node mapping. I have discussed with her in detail the risks and benefits of the surgery as well as some of the technical aspects and she understands and wishes to proceed.

## 2016-08-06 NOTE — Transfer of Care (Signed)
Immediate Anesthesia Transfer of Care Note  Patient: EZZIE SENAT  Procedure(s) Performed: Procedure(s): BREAST LUMPECTOMY WITH RADIOACTIVE SEED AND SENTINEL LYMPH NODE BIOPSY (Right)  Patient Location: PACU  Anesthesia Type:General  Level of Consciousness: awake, oriented and patient cooperative  Airway & Oxygen Therapy: Patient Spontanous Breathing and Patient connected to nasal cannula oxygen  Post-op Assessment: Report given to RN and Post -op Vital signs reviewed and stable  Post vital signs: Reviewed  Last Vitals:  Vitals:   08/06/16 0644  BP: 125/63  Pulse: 64  Resp: 18  Temp: 36.6 C    Last Pain:  Vitals:   08/06/16 0644  TempSrc: Oral      Patients Stated Pain Goal: 5 (38/32/91 9166)  Complications: No apparent anesthesia complications

## 2016-08-07 ENCOUNTER — Ambulatory Visit (HOSPITAL_BASED_OUTPATIENT_CLINIC_OR_DEPARTMENT_OTHER): Payer: Federal, State, Local not specified - PPO | Admitting: Genetic Counselor

## 2016-08-07 ENCOUNTER — Encounter (HOSPITAL_COMMUNITY): Payer: Self-pay | Admitting: General Surgery

## 2016-08-07 ENCOUNTER — Other Ambulatory Visit: Payer: Federal, State, Local not specified - PPO

## 2016-08-07 DIAGNOSIS — C50411 Malignant neoplasm of upper-outer quadrant of right female breast: Secondary | ICD-10-CM

## 2016-08-07 DIAGNOSIS — Z803 Family history of malignant neoplasm of breast: Secondary | ICD-10-CM | POA: Diagnosis not present

## 2016-08-07 DIAGNOSIS — Z170 Estrogen receptor positive status [ER+]: Secondary | ICD-10-CM | POA: Diagnosis not present

## 2016-08-07 NOTE — Progress Notes (Signed)
REFERRING PROVIDER: Chauncey Cruel, MD [address] [place], [place] [address]  PRIMARY PROVIDER:  Manon Hilding, MD  PRIMARY REASON FOR VISIT:  1. Malignant neoplasm of upper-outer quadrant of right breast in female, estrogen receptor positive ([place])   2. Family history of breast cancer      HISTORY OF PRESENT ILLNESS:   Holly Best, a 66 y.o. female, was seen for a [facility] cancer genetics consultation at the request of Dr. Jana Best due to a personal and family history of cancer.  Holly Best presents to clinic today to discuss the possibility of a hereditary predisposition to cancer, genetic testing, and to further clarify her future cancer risks, as well as potential cancer risks for family members.   In July 2018, at the age of 53, Holly Best was diagnosed with invasive ductal carcinoma of the right breast. This was treated with lumpectomy.  She is scheduled to undergo radiation therapy and tamoxifen.    CANCER HISTORY:   No history exists.     HORMONAL RISK FACTORS:  Menarche was at age 57-13.  First live birth at age 66.  OCP use for approximately 2-3 years.  Ovaries intact: yes.  Hysterectomy: no.  Menopausal status: postmenopausal.  HRT use: 0 years. Colonoscopy: yes; normal. Mammogram within the last year: yes. Number of breast biopsies: 1. Up to date with pelvic exams:  yes. Any excessive radiation exposure in the past:  no  Past Medical History:  Diagnosis Date  . Cancer [facility])    right breast cancer  . Crohn disease ([place])   . Depression   . Family history of breast cancer   . Hypercholesteremia     Past Surgical History:  Procedure Laterality Date  . BREAST LUMPECTOMY WITH RADIOACTIVE SEED AND SENTINEL LYMPH NODE BIOPSY Right 08/06/2016   Procedure: BREAST LUMPECTOMY WITH RADIOACTIVE SEED AND SENTINEL LYMPH NODE BIOPSY;  Surgeon: Holly Kussmaul, MD;  Location: [facility];  Service: General;  Laterality: Right;  . COLONOSCOPY    . dental  implants     upper  . LUMBAR LAMINECTOMY Bilateral 03/23/2013   Procedure: MICRO LUMBAR DECOMPRESSION, FORAMINOTOMY L4-5;  Surgeon: Johnn Hai, MD;  Location: [facility];  Service: Orthopedics;  Laterality: Bilateral;    Social History   Social History  . Marital status: Married    Spouse name: N/A  . Number of children: N/A  . Years of education: N/A   Social History Main Topics  . Smoking status: Former Smoker    Packs/day: 1.00    Years: 10.00    Types: Cigarettes    Quit date: 02/17/1978  . Smokeless tobacco: Never Used  . Alcohol use Yes     Comment: occassionally  . Drug use: No  . Sexual activity: Not Asked   Other Topics Concern  . None   Social History Narrative  . None     FAMILY HISTORY:  We obtained a detailed, 4-generation family history.  Significant diagnoses are listed below: Family History  Problem Relation Age of Onset  . Breast cancer Sister 12  . Breast cancer Other 55  . Lung cancer Mother   . Lung cancer Father   . Breast cancer Maternal Grandmother        dx in her 62s  . Other Brother        non-malignant spinal tumor  . Breast cancer Maternal Aunt        dx > 50  . Lupus Maternal Aunt   .  Breast cancer Maternal Aunt        dx >50    The patient has one son and two grandsons who are all cancer free.  She has three brothers and one sister.  Her sister was diagnosed with breast cancer at age 48.  Her oldest brother's daughter was recently diagnosed with breast cancer at age 26.  One brother died due to complications of a non-malignant spinal tumor.    Both parents are deceased - they had lung cancer.  Her father had a maternal half brother who is deceased.  Her paternal grandmother died of a stroke.  There is no further information about cancer in the paternal family.  The patient's mother had four sisters and a brother.  Two sisters had breast cancer over the age of 24.  The maternal grandmother also had breast cancer in her 68's.  [name] is unaware of previous family history of genetic testing for hereditary cancer risks. Patient's maternal ancestors are of Caucasian descent, and paternal ancestors are of Caucasian descent. There is no reported Ashkenazi Jewish ancestry. There is no known consanguinity.  GENETIC COUNSELING ASSESSMENT: Holly Best is a 66 y.o. female with a personal and family history of breast cancer which is somewhat suggestive of a hereditary cancer syndrome and predisposition to cancer. We, therefore, discussed and recommended the following at today's visit.   DISCUSSION: We discussed that about 5-10% of breast cancer is hereditary with most cases due to BRCA mutations.  Other hereditary genes associated with an increased risk for breast cancer include ATM, CHEK2 and PALB2.  We discussed whether her niece may have undergone genetic testing since she was younger at her diagnosis and has a history that warrants testing.  She did not think that she has undergone testing at this time.  We recommended that she consider it.  We reviewed the characteristics, features and inheritance patterns of hereditary cancer syndromes. We also discussed genetic testing, including the appropriate family members to test, the process of testing, insurance coverage and turn-around-time for results. We discussed the implications of a negative, positive and/or variant of uncertain significant result. We recommended Holly Best pursue genetic testing for the Common hereditary cancer gene panel. The Hereditary Gene Panel offered by [facility] includes sequencing and/or deletion duplication testing of the following 46 genes: APC, ATM, AXIN2, BARD1, BMPR1A, BRCA1, BRCA2, BRIP1, CDH1, CDKN2A (p14ARF), CDKN2A (p16INK4a), CHEK2, CTNNA1, DICER1, EPCAM (Deletion/duplication testing only), GREM1 (promoter region deletion/duplication testing only), KIT, MEN1, MLH1, MSH2, MSH3, MSH6, MUTYH, NBN, NF1, NHTL1, PALB2, PDGFRA, PMS2, POLD1, POLE, PTEN,  RAD50, RAD51C, RAD51D, SDHB, SDHC, SDHD, SMAD4, SMARCA4. STK11, TP53, TSC1, TSC2, and VHL.  The following genes were evaluated for sequence changes only: SDHA and HOXB13 c.251G>A variant only.  Based on Holly Best's personal and family history of cancer, she meets medical criteria for genetic testing. Despite that she meets criteria, she may still have an out of pocket cost. We discussed that if her out of pocket cost for testing is over $100, the laboratory will call and confirm whether she wants to proceed with testing.  If the out of pocket cost of testing is less than $100 she will be billed by the genetic testing laboratory.   PLAN: After considering the risks, benefits, and limitations, Holly Best  provided informed consent to pursue genetic testing and the blood sample was sent to [facility] for analysis of the Common hereditary cancer panel. Results should be available within approximately 2-3 weeks' time, at  which point they will be disclosed by telephone to Holly Best, as will any additional recommendations warranted by these results. Holly Best will receive a summary of her genetic counseling visit and a copy of her results once available. This information will also be available in Epic. We encouraged Holly Best to remain in contact with cancer genetics annually so that we can continuously update the family history and inform her of any changes in cancer genetics and testing that may be of benefit for her family. Holly Best questions were answered to her satisfaction today. Our contact information was provided should additional questions or concerns arise.  Based on Holly Best's family history, we recommended her niece, Holly Best, who was diagnosed with breast cancer at age 78, have genetic counseling and testing. Holly Best will let us know if we can be of any assistance in coordinating genetic counseling and/or testing for this family member.   Lastly, we  encouraged Holly Best to remain in contact with cancer genetics annually so that we can continuously update the family history and inform her of any changes in cancer genetics and testing that may be of benefit for this family.   [name] questions were answered to her satisfaction today. Our contact information was provided should additional questions or concerns arise. Thank you for the referral and allowing [place] to share in the care of your patient.   Holly Best, [place], [facility] Certified Genetic Counselor [email or web address] phone: [phone]  The patient was seen for a total of 50 minutes in face-to-face genetic counseling.  This patient was discussed with Drs. Magrinat, Lindi Adie and/or Burr Medico who agrees with the above.    _______________________________________________________________________ For Office Staff:  Number of people involved in session: 2 Was an Intern/ student involved with case: no

## 2016-08-07 NOTE — Addendum Note (Signed)
Addendum  created 08/07/16 1522 by Belinda Block, MD   Anesthesia Intra Blocks edited, Delete clinical note, Order Canceled from Note, Sign clinical note

## 2016-08-11 ENCOUNTER — Telehealth: Payer: Self-pay | Admitting: *Deleted

## 2016-08-11 NOTE — Telephone Encounter (Signed)
Ordered oncotype per Dr. Jana Hakim.  Faxed requisition to pathology and faxed PA to [facility].

## 2016-08-11 NOTE — Telephone Encounter (Signed)
  Oncology Nurse Navigator Documentation  Navigator Location: [facility] (08/11/16 1500)   )Navigator Encounter Type: Telephone (08/11/16 1500) Telephone: [facility] Crocker Call;Appt Confirmation/Clarification (08/11/16 1500)  Discussed appt with Dr. Marlou Starks on 8/20.                                                Time Spent with Patient: 15 (08/11/16 1500)

## 2016-08-19 ENCOUNTER — Telehealth: Payer: Self-pay | Admitting: Genetic Counselor

## 2016-08-19 ENCOUNTER — Encounter: Payer: Self-pay | Admitting: Genetic Counselor

## 2016-08-19 ENCOUNTER — Ambulatory Visit: Payer: Self-pay | Admitting: Genetic Counselor

## 2016-08-19 ENCOUNTER — Encounter (HOSPITAL_COMMUNITY): Payer: Self-pay

## 2016-08-19 ENCOUNTER — Encounter: Payer: Self-pay | Admitting: Radiation Oncology

## 2016-08-19 ENCOUNTER — Telehealth: Payer: Self-pay | Admitting: *Deleted

## 2016-08-19 DIAGNOSIS — Z1379 Encounter for other screening for genetic and chromosomal anomalies: Secondary | ICD-10-CM | POA: Insufficient documentation

## 2016-08-19 DIAGNOSIS — C50411 Malignant neoplasm of upper-outer quadrant of right female breast: Secondary | ICD-10-CM

## 2016-08-19 DIAGNOSIS — Z803 Family history of malignant neoplasm of breast: Secondary | ICD-10-CM

## 2016-08-19 DIAGNOSIS — Z170 Estrogen receptor positive status [ER+]: Secondary | ICD-10-CM

## 2016-08-19 NOTE — Progress Notes (Signed)
HPI: Ms. Vanacker was previously seen in the [place] clinic due to a personal and family history of cancer and concerns regarding a hereditary predisposition to cancer. Please refer to our prior cancer genetics clinic note for more information regarding Ms. Deerman's medical, social and family histories, and our assessment and recommendations, at the time. Ms. Zappia recent genetic test results were disclosed to her, as were recommendations warranted by these results. These results and recommendations are discussed in more detail below.  CANCER HISTORY:    Malignant neoplasm of upper-outer quadrant of right breast in female, estrogen receptor positive ([place])   07/24/2016 Initial Diagnosis    Malignant neoplasm of upper-outer quadrant of right breast in female, estrogen receptor positive ([place])     08/17/2016 Genetic Testing    Negative genetic testing on the common hereditary cancer panel.  The Hereditary Gene Panel offered by [facility] includes sequencing and/or deletion duplication testing of the following 46 genes: APC, ATM, AXIN2, BARD1, BMPR1A, BRCA1, BRCA2, BRIP1, CDH1, CDKN2A (p14ARF), CDKN2A (p16INK4a), CHEK2, CTNNA1, DICER1, EPCAM (Deletion/duplication testing only), GREM1 (promoter region deletion/duplication testing only), KIT, MEN1, MLH1, MSH2, MSH3, MSH6, MUTYH, NBN, NF1, NHTL1, PALB2, PDGFRA, PMS2, POLD1, POLE, PTEN, RAD50, RAD51C, RAD51D, SDHB, SDHC, SDHD, SMAD4, SMARCA4. STK11, TP53, TSC1, TSC2, and VHL.  The following genes were evaluated for sequence changes only: SDHA and HOXB13 c.251G>A variant only.  The report date is August 17, 2016.       FAMILY HISTORY:  We obtained a detailed, 4-generation family history.  Significant diagnoses are listed below: Family History  Problem Relation Age of Onset  . Breast cancer Sister 72  . Breast cancer Other 55  . Lung cancer Mother   . Lung cancer Father   . Breast cancer Maternal Grandmother        dx in her 51s   . Other Brother        non-malignant spinal tumor  . Breast cancer Maternal Aunt        dx > 50  . Lupus Maternal Aunt   . Breast cancer Maternal Aunt        dx >50    The patient has one son and two grandsons who are all cancer free.  She has three brothers and one sister.  Her sister was diagnosed with breast cancer at age 38.  Her oldest brother's daughter was recently diagnosed with breast cancer at age 54.  One brother died due to complications of a non-malignant spinal tumor.    Both parents are deceased - they had lung cancer.  Her father had a maternal half brother who is deceased.  Her paternal grandmother died of a stroke.  There is no further information about cancer in the paternal family.  The patient's mother had four sisters and a brother.  Two sisters had breast cancer over the age of 71.  The maternal grandmother also had breast cancer in her 76's.  Ms. Laurel is unaware of previous family history of genetic testing for hereditary cancer risks. Patient's maternal ancestors are of Caucasian descent, and paternal ancestors are of Caucasian descent. There is no reported Ashkenazi Jewish ancestry. There is no known consanguinity.  GENETIC TEST RESULTS: Genetic testing reported out on August 17, 2016 through the common hereditary cancer panel found no deleterious mutations.  The Hereditary Gene Panel offered by [facility] includes sequencing and/or deletion duplication testing of the following 46 genes: APC, ATM, AXIN2, BARD1, BMPR1A, BRCA1, BRCA2, BRIP1, CDH1, CDKN2A (p14ARF), CDKN2A (p16INK4a),  CHEK2, CTNNA1, DICER1, EPCAM (Deletion/duplication testing only), GREM1 (promoter region deletion/duplication testing only), KIT, MEN1, MLH1, MSH2, MSH3, MSH6, MUTYH, NBN, NF1, NHTL1, PALB2, PDGFRA, PMS2, POLD1, POLE, PTEN, RAD50, RAD51C, RAD51D, SDHB, SDHC, SDHD, SMAD4, SMARCA4. STK11, TP53, TSC1, TSC2, and VHL.  The following genes were evaluated for sequence changes only: SDHA and HOXB13  c.251G>A variant only.  The test report has been scanned into EPIC and is located under the Molecular Pathology section of the Results Review tab.   We discussed with Ms. Yokley that since the current genetic testing is not perfect, it is possible there may be a gene mutation in one of these genes that current testing cannot detect, but that chance is small. We also discussed, that it is possible that another gene that has not yet been discovered, or that we have not yet tested, is responsible for the cancer diagnoses in the family, and it is, therefore, important to remain in touch with cancer genetics in the future so that we can continue to offer Ms. Randazzo the most up to date genetic testing.    CANCER SCREENING RECOMMENDATIONS:  Given Ms. Licea's personal and family histories, we must interpret these negative results with some caution.  Families with features suggestive of hereditary risk for cancer tend to have multiple family members with cancer, diagnoses in multiple generations and diagnoses before the age of 93. Ms. Hush family exhibits some of these features. Thus this result may simply reflect our current inability to detect all mutations within these genes or there may be a different gene that has not yet been discovered or tested.   RECOMMENDATIONS FOR FAMILY MEMBERS: Women in this family might be at some increased risk of developing cancer, over the general population risk, simply due to the family history of cancer. We recommended women in this family have a yearly mammogram beginning at age 59, or 73 years younger than the earliest onset of cancer, an annual clinical breast exam, and perform monthly breast self-exams. Women in this family should also have a gynecological exam as recommended by their primary provider. All family members should have a colonoscopy by age 9.  Based on Ms. Uecker's family history, we recommended her niece, Valente David, who was diagnosed  with breast cancer at age 45, have genetic counseling and testing. Ms. Ranes will let us know if we can be of any assistance in coordinating genetic counseling and/or testing for this family member.   FOLLOW-UP: Lastly, we discussed with Ms. Piche that cancer genetics is a rapidly advancing field and it is possible that new genetic tests will be appropriate for her and/or her family members in the future. We encouraged her to remain in contact with cancer genetics on an annual basis so we can update her personal and family histories and let her know of advances in cancer genetics that may benefit this family.   Our contact number was provided. Ms. Ryback questions were answered to her satisfaction, and she knows she is welcome to call us at anytime with additional questions or concerns.   Roma Kayser, MS, [facility] Certified Genetic Counselor [email or web address]

## 2016-08-19 NOTE — Telephone Encounter (Signed)
Received oncotype results of 16.  Spoke with patient and let her know that she did not need chemo.  She would like to cancel her appointment with Dr. Jana Hakim and wait and see him after XRT complete.  Informed her I would cancel that appointment and send a message to get her scheduled with Dr. Isidore Moos.

## 2016-08-19 NOTE — Telephone Encounter (Signed)
Revealed negative genetic testing.  Discussed that we do not know why she has breast cancer or why there is cancer in the family. It could be due to a different gene that we are not testing, or maybe our current technology may not be able to pick something up.  It will be important for her to keep in contact with genetics to keep up with whether additional testing may be needed.  It would be helpful to have others in the family tested.  Her niece would be a good person to test.  She will see if she can have her get tested.

## 2016-08-25 NOTE — Progress Notes (Signed)
Location of Breast Cancer: Right Breast  Histology per Pathology Report:  07/22/16 Diagnosis Breast, right, needle core biopsy, 11:00 o'clock - INVASIVE DUCTAL CARCINOMA  Receptor Status: ER(100%), PR (90%), Her2-neu (NEG), Ki-(5%)  08/06/16 Diagnosis 1. Breast, lumpectomy, Right - INVASIVE DUCTAL CARCINOMA WITH CALCIFICATIONS, GRADE I/III, SPANNING 0.7 CM. - ATYPICAL DUCTAL HYPERPLASIA. - THE SURGICAL RESECTION MARGINS ARE NEGATIVE FOR CARCINOMA. - SEE ONCOLOGY TABLE BELOW. 2. Lymph node, sentinel, biopsy, Right Axilla #1 - THERE IS NO EVIDENCE OF CARCINOMA IN 1 OF 1 LYMPH NODE (0/1). 3. Lymph node, sentinel, biopsy, Right axilla #2 - THERE IS NO EVIDENCE OF CARCINOMA IN 1 OF 1 LYMPH NODE (0/1). 4. Lymph node, sentinel, biopsy, Right axilla #3 - THERE IS NO EVIDENCE OF CARCINOMA IN 1 OF 1 LYMPH NODE (0/1). 5. Breast, excision, Additional inferior margin right - FIBROCYSTIC CHANGES WITH ADENOSIS AND CALCIFICATIONS. - THERE IS NO EVIDENCE OF MALIGNANCY. - SEE COMMENT. 6. Breast, excision, additional medial margin right - FIBROCYSTIC CHANGES WITH ADENOSIS AND CALCIFICATIONS. - THERE IS NO EVIDENCE OF MALIGNANCY. - SEE COMMENT. 7. Lymph node, sentinel, biopsy, Right axillary - BENIGN FIBROADIPOSE TISSUE. - LYMPH NODAL TISSUE IS NOT IDENTIFIED. - THERE IS NO EVIDENCE OF MALIGNANCY.  Did patient present with symptoms or was this found on screening mammography?: It was found on a screening mammogram.   Past/Anticipated interventions by surgeon, if any: 08/06/16 PROCEDURE:  Procedure(s): BREAST LUMPECTOMY WITH RADIOACTIVE SEED AND DEEP RIGHT AXILLARY SENTINEL LYMPH NODE BIOPSY (Right) SURGEON:  Surgeon(s) and Role:    Holly Kussmaul, MD - Primary  Past/Anticipated interventions by medical oncology, if any:  Holly Best in the Breast Clinic:  Breast conserving surgery with sentinel lymph node sampling pending (done 08/06/16)   Genetics testing pending: Patient does not wish to  delay lumpectomy while awaiting genetics results Holly Best saw 08/19/16, results negative)   Oncotype DX to be obtained from the definitive surgical samples: Chemotherapy not anticipated (Oncotype score 16, she will not receive chemotherapy)   Adjuvant radiation   To start anti-estrogens at the completion of local treatment  Lymphedema issues, if any:  She denies, She has good arm mobility.   Pain issues, if any:  She has soreness to underneath her Right upper arm and Right Breast incision site.   SAFETY ISSUES:  Prior radiation? No  Pacemaker/ICD? No  Possible current pregnancy? No  Is the patient on methotrexate? No  Current Complaints / other details:    BP 132/83   Pulse 73   Temp 98 F (36.7 C)   Ht 5' 6"  (1.676 m)   Wt 158 lb 12.8 oz (72 kg)   SpO2 98% Comment: room air  BMI 25.63 kg/m    Wt Readings from Last 3 Encounters:  08/29/16 158 lb 12.8 oz (72 kg)  07/30/16 159 lb (72.1 kg)  03/23/13 172 lb (78 kg)      Holly Best, Holly Police, RN 08/25/2016,10:23 AM

## 2016-08-29 ENCOUNTER — Ambulatory Visit: Payer: Federal, State, Local not specified - PPO | Admitting: Oncology

## 2016-08-29 ENCOUNTER — Encounter: Payer: Self-pay | Admitting: Radiation Oncology

## 2016-08-29 ENCOUNTER — Ambulatory Visit
Admission: RE | Admit: 2016-08-29 | Discharge: 2016-08-29 | Disposition: A | Discharge status: Home / Self Care | Payer: Federal, State, Local not specified - PPO | Source: Ambulatory Visit | Attending: Radiation Oncology | Admitting: Radiation Oncology

## 2016-08-29 DIAGNOSIS — Z510 Encounter for antineoplastic radiation therapy: Secondary | ICD-10-CM | POA: Diagnosis not present

## 2016-08-29 DIAGNOSIS — Z79899 Other long term (current) drug therapy: Secondary | ICD-10-CM | POA: Diagnosis not present

## 2016-08-29 DIAGNOSIS — F329 Major depressive disorder, single episode, unspecified: Secondary | ICD-10-CM | POA: Diagnosis not present

## 2016-08-29 DIAGNOSIS — C50411 Malignant neoplasm of upper-outer quadrant of right female breast: Secondary | ICD-10-CM

## 2016-08-29 DIAGNOSIS — Z801 Family history of malignant neoplasm of trachea, bronchus and lung: Secondary | ICD-10-CM | POA: Diagnosis not present

## 2016-08-29 DIAGNOSIS — Z87891 Personal history of nicotine dependence: Secondary | ICD-10-CM | POA: Diagnosis not present

## 2016-08-29 DIAGNOSIS — E7800 Pure hypercholesterolemia, unspecified: Secondary | ICD-10-CM | POA: Diagnosis not present

## 2016-08-29 DIAGNOSIS — Z7982 Long term (current) use of aspirin: Secondary | ICD-10-CM | POA: Diagnosis not present

## 2016-08-29 DIAGNOSIS — Z170 Estrogen receptor positive status [ER+]: Principal | ICD-10-CM

## 2016-08-29 DIAGNOSIS — Z803 Family history of malignant neoplasm of breast: Secondary | ICD-10-CM | POA: Diagnosis not present

## 2016-08-29 DIAGNOSIS — K5090 Crohn's disease, unspecified, without complications: Secondary | ICD-10-CM | POA: Diagnosis not present

## 2016-08-29 NOTE — Progress Notes (Signed)
Radiation Oncology         [phone]) [phone] ________________________________  Name: Holly Best MRN: [id number]  Date: 08/29/2016  DOB: [birth year]  Follow-Up Visit Note  Outpatient  CC: Manon Hilding, MD  Magrinat, [name], MD  Diagnosis:      ICD-10-CM   1. Malignant neoplasm of upper-outer quadrant of right breast in female, estrogen receptor positive ([place]) C50.411    Z17.0     Clinical Stage I T1bN0M0 Right Breast UIQ Invasive Ductal Carcinoma, ER(+) / PR(+) / Her2(-), Grade 1, Pathologic Stage IA T1bN0M0  CHIEF COMPLAINT: Here to discuss management of right breast cancer  Narrative:  The patient returns today for follow-up.     Since consultation, she underwent right breast lumpectomy on 08/06/2016. Final pathology revealed invasive ductal carcinoma with calcifications, Grade I/III, spanning 0.7 cm, with negative surgical resection margins. 3 right axillary lymph nodes were negative for carcinoma. Margins were negative by more than 2 mm. Her tumor is Grade 1. ER 100%, PR 90%.  The patient's genetic testing revealed negative result. Her oncotype score was 16, therefore she will not receive chemotherapy. She presents today to discuss adjuvant radiation and will begin antiestrogen at the completion of local treatment. She is accompanied by her husband.   On review of systems, the patient explains that she has recovered from her surgery well. She is positive for soreness underneath her right upper arm and right breast incision site.            ALLERGIES:  has No Known Allergies.  Meds: Current Outpatient Prescriptions  Medication Sig Dispense Refill  . aspirin EC 81 MG tablet Take 81 mg by mouth daily.    Marland Kitchen atorvastatin (LIPITOR) 10 MG tablet Take 5 mg by mouth 3 (three) times a week.    . calcium carbonate (TUMS - DOSED IN MG ELEMENTAL CALCIUM) 500 MG chewable tablet Chew 500 mg by mouth at bedtime.     Marland Kitchen LORazepam (ATIVAN) 1 MG tablet Take 1 mg by mouth at bedtime.    .  naproxen (NAPROSYN) 500 MG tablet Take 500 mg by mouth at bedtime.     Marland Kitchen oxyCODONE-acetaminophen (PERCOCET/ROXICET) 5-325 MG tablet Take 1 tablet by mouth every 6 (six) hours as needed for severe pain.    Marland Kitchen PARoxetine (PAXIL) 20 MG tablet Take 20 mg by mouth every evening.    Marland Kitchen HYDROcodone-acetaminophen (NORCO/VICODIN) 5-325 MG tablet Take 1-2 tablets by mouth every 4 (four) hours as needed for moderate pain or severe pain. (Patient not taking: Reported on 08/29/2016) 15 tablet 0   No current facility-administered medications for this encounter.    Review of Systems: A 10+ POINT REVIEW OF SYSTEMS WAS OBTAINED including neurology, dermatology, psychiatry, cardiac, respiratory, lymph, extremities, GI, GU, Musculoskeletal, constitutional, breasts, reproductive, HEENT.  All pertinent positives are noted in the HPI.  All others are negative.  Physical Findings:  height is _0  (1.676 m) and weight is 158 lb 12.8 oz (72 kg). Her temperature is 98 F (36.7 C). Her blood pressure is 132/83 and her pulse is 73. Her oxygen saturation is 98%. .     General: Alert and oriented, in no acute distress. Breast exam reveals that the UOQ of the right breast her incision is healing well, and she doesn't have excessive swelling.  Skin: No concerning skin changes of her right breast around her incision.  Psychiatric: Judgment and insight are intact. Affect is appropriate.  Lab Findings: Lab Results  Component Value Date  WBC 6.3 07/30/2016   HGB 12.2 07/30/2016   HCT 36.3 07/30/2016   MCV 96.0 07/30/2016   PLT 318 07/30/2016     Radiographic Findings: Mm Breast Surgical Specimen  Result Date: 08/06/2016 CLINICAL DATA:  Status post lumpectomy today after earlier radioactive seed localization. EXAM: SPECIMEN RADIOGRAPH OF THE RIGHT BREAST COMPARISON:  Previous exam(s). FINDINGS: Status post excision of the right breast. The radioactive seed and biopsy marker clip are present, completely intact, and were  marked for pathology. The positions of the radioactive seed and biopsy marker clip within the specimen were discussed with the OR staff during the procedure. IMPRESSION: Specimen radiograph of the right breast. Electronically Signed   By: Franki Cabot M.D.   On: 08/06/2016 09:42    Impression/Plan: Right Breast Cancer We discussed adjuvant radiotherapy today.  I recommend radiotherapy to the right breast in order to reduce chance of locoregional recurrence by 2/3.  The risks, benefits and side effects of this treatment were discussed in detail.  She understands that radiotherapy is associated with skin irritation and fatigue in the acute setting. Late effects can include cosmetic changes and rare injury to internal organs.   She is enthusiastic about proceeding with treatment. A consent form has been signed and placed in her chart.  A total of 3 medically necessary complex treatment devices will be fabricated and supervised by me: 2 fields with MLCs for custom blocks to protect heart, and lungs;  and, a Vac-lok. MORE COMPLEX DEVICES MAY BE MADE IN DOSIMETRY FOR FIELD IN FIELD BEAMS FOR DOSE HOMOGENEITY.  I have requested : 3D Simulation which is medically necessary to give adequate dose to at risk tissues while sparing lungs and heart.  I have requested a DVH of the following structures: lungs, heart, right lumpectomy cavity.    The patient will receive 40.05 Gy in 15 fractions to the right breast with 2 fields.  This will be  followed by a boost.  I encouraged the patient to remain physically  active during her treatment to improve her prognosis.   I spent 15 minutes minutes face to face with the patient and more than 50% of that time was spent in counseling and/or coordination of care. _____________________________________   Eppie Gibson, MD  This document serves as a record of services personally performed by Eppie Gibson, MD. It was created on her behalf by Rae Lips, a trained medical  scribe. The creation of this record is based on the scribe's personal observations and the provider's statements to them. This document has been checked and approved by the attending provider.

## 2016-09-01 ENCOUNTER — Ambulatory Visit
Admission: RE | Admit: 2016-09-01 | Discharge: 2016-09-01 | Disposition: A | Discharge status: Home / Self Care | Payer: Federal, State, Local not specified - PPO | Source: Ambulatory Visit | Attending: Radiation Oncology | Admitting: Radiation Oncology

## 2016-09-01 DIAGNOSIS — C50411 Malignant neoplasm of upper-outer quadrant of right female breast: Secondary | ICD-10-CM

## 2016-09-01 DIAGNOSIS — Z510 Encounter for antineoplastic radiation therapy: Secondary | ICD-10-CM | POA: Diagnosis not present

## 2016-09-01 DIAGNOSIS — Z170 Estrogen receptor positive status [ER+]: Principal | ICD-10-CM

## 2016-09-01 NOTE — Progress Notes (Signed)
  Radiation Oncology         [phone]) [phone] ________________________________  Name: Holly Best MRN: [id number]  Date: 09/01/2016  DOB: [birth year]  SIMULATION AND TREATMENT PLANNING NOTE    Outpatient  DIAGNOSIS:     ICD-10-CM   1. Malignant neoplasm of upper-outer quadrant of right breast in female, estrogen receptor positive ([place]) C50.411    Z17.0     NARRATIVE:  The patient was brought to the [facility].  Identity was confirmed.  All relevant records and images related to the planned course of therapy were reviewed.  The patient freely provided informed written consent to proceed with treatment after reviewing the details related to the planned course of therapy. The consent form was witnessed and verified by the simulation staff.    Then, the patient was set-up in a stable reproducible supine position for radiation therapy with her ipsilateral arm over her head, and her upper body secured in a custom-made Vac-lok device.  CT images were obtained.  Surface markings were placed.  The CT images were loaded into the planning software.    TREATMENT PLANNING NOTE: Treatment planning then occurred.  The radiation prescription was entered and confirmed.     A total of 3 medically necessary complex treatment devices were fabricated and supervised by me: 2 fields with MLCs for custom blocks to protect heart, and lungs;  and, a Vac-lok. MORE COMPLEX DEVICES MAY BE MADE IN DOSIMETRY FOR FIELD IN FIELD BEAMS FOR DOSE HOMOGENEITY.  I have requested : 3D Simulation which is medically necessary to give adequate dose to at risk tissues while sparing lungs and heart.  I have requested a DVH of the following structures: lungs, heart, lumpectomy cavity.    The patient will receive 40.05 Gy in 15 fractions to the right breast with 2 tangential fields.  This will be followed by a boost.  Optical Surface Tracking Plan:  Since intensity modulated radiotherapy (IMRT) and 3D conformal  radiation treatment methods are predicated on accurate and precise positioning for treatment, intrafraction motion monitoring is medically necessary to ensure accurate and safe treatment delivery. The ability to quantify intrafraction motion without excessive ionizing radiation dose can only be performed with optical surface tracking. Accordingly, surface imaging offers the opportunity to obtain 3D measurements of patient position throughout IMRT and 3D treatments without excessive radiation exposure. I am ordering optical surface tracking for this patient's upcoming course of radiotherapy.  ________________________________   Reference:  Ursula Alert, J, et al. Surface imaging-based analysis of intrafraction motion for breast radiotherapy patients.Journal of [place], n. 6, nov. 2014. ISSN [id number].  Available at: <http://www.jacmp.org/index.php/jacmp/article/view/4957>.    -----------------------------------  Eppie Gibson, MD

## 2016-09-03 ENCOUNTER — Telehealth: Payer: Self-pay | Admitting: Oncology

## 2016-09-03 NOTE — Telephone Encounter (Signed)
Spoke with patient and scheduled a f/u with Holly Best on 10/1. Per 8/27 sch msg Dr.Magrinat wanted to see her but he is on vacation that day.

## 2016-09-05 DIAGNOSIS — Z510 Encounter for antineoplastic radiation therapy: Secondary | ICD-10-CM | POA: Diagnosis not present

## 2016-09-09 ENCOUNTER — Ambulatory Visit
Admission: RE | Admit: 2016-09-09 | Discharge: 2016-09-09 | Disposition: A | Discharge status: Home / Self Care | Payer: Federal, State, Local not specified - PPO | Source: Ambulatory Visit | Attending: Radiation Oncology | Admitting: Radiation Oncology

## 2016-09-09 DIAGNOSIS — Z510 Encounter for antineoplastic radiation therapy: Secondary | ICD-10-CM | POA: Diagnosis not present

## 2016-09-10 ENCOUNTER — Ambulatory Visit
Admission: RE | Admit: 2016-09-10 | Discharge: 2016-09-10 | Disposition: A | Discharge status: Home / Self Care | Payer: Federal, State, Local not specified - PPO | Source: Ambulatory Visit | Attending: Radiation Oncology | Admitting: Radiation Oncology

## 2016-09-10 DIAGNOSIS — Z510 Encounter for antineoplastic radiation therapy: Secondary | ICD-10-CM | POA: Diagnosis not present

## 2016-09-11 ENCOUNTER — Ambulatory Visit
Admission: RE | Admit: 2016-09-11 | Discharge: 2016-09-11 | Disposition: A | Discharge status: Home / Self Care | Payer: Federal, State, Local not specified - PPO | Source: Ambulatory Visit | Attending: Radiation Oncology | Admitting: Radiation Oncology

## 2016-09-11 DIAGNOSIS — Z510 Encounter for antineoplastic radiation therapy: Secondary | ICD-10-CM | POA: Diagnosis not present

## 2016-09-11 DIAGNOSIS — Z170 Estrogen receptor positive status [ER+]: Principal | ICD-10-CM

## 2016-09-11 DIAGNOSIS — C50411 Malignant neoplasm of upper-outer quadrant of right female breast: Secondary | ICD-10-CM

## 2016-09-11 MED ORDER — RADIAPLEXRX EX GEL
Freq: Once | CUTANEOUS | Status: AC
  Administered 2016-09-11: 10:00:00 via TOPICAL

## 2016-09-11 MED ORDER — ALRA NON-METALLIC DEODORANT (RAD-ONC)
1.0000 "application " | Freq: Once | TOPICAL | Status: AC
  Administered 2016-09-11: 1 via TOPICAL

## 2016-09-11 NOTE — Progress Notes (Signed)
Pt here for patient teaching.  Pt given Radiation and You booklet, skin care instructions, Alra deodorant and Radiaplex gel.  Reviewed areas of pertinence such as fatigue, skin changes, breast tenderness and breast swelling . Pt able to give teach back of to pat skin, use unscented/gentle soap and drink plenty of water,apply Radiaplex bid, avoid applying anything to skin within 4 hours of treatment, avoid wearing an under wire bra and to use an electric razor if they must shave. Pt verbalizes understanding of information given and will contact nursing with any questions or concerns.     [email or web address]

## 2016-09-12 ENCOUNTER — Ambulatory Visit
Admission: RE | Admit: 2016-09-12 | Discharge: 2016-09-12 | Disposition: A | Discharge status: Home / Self Care | Payer: Federal, State, Local not specified - PPO | Source: Ambulatory Visit | Attending: Radiation Oncology | Admitting: Radiation Oncology

## 2016-09-12 DIAGNOSIS — Z510 Encounter for antineoplastic radiation therapy: Secondary | ICD-10-CM | POA: Diagnosis not present

## 2016-09-15 ENCOUNTER — Ambulatory Visit
Admission: RE | Admit: 2016-09-15 | Discharge: 2016-09-15 | Disposition: A | Discharge status: Home / Self Care | Payer: Federal, State, Local not specified - PPO | Source: Ambulatory Visit | Attending: Radiation Oncology | Admitting: Radiation Oncology

## 2016-09-15 DIAGNOSIS — Z510 Encounter for antineoplastic radiation therapy: Secondary | ICD-10-CM | POA: Diagnosis not present

## 2016-09-16 ENCOUNTER — Ambulatory Visit
Admission: RE | Admit: 2016-09-16 | Discharge: 2016-09-16 | Disposition: A | Discharge status: Home / Self Care | Payer: Federal, State, Local not specified - PPO | Source: Ambulatory Visit | Attending: Radiation Oncology | Admitting: Radiation Oncology

## 2016-09-16 DIAGNOSIS — Z510 Encounter for antineoplastic radiation therapy: Secondary | ICD-10-CM | POA: Diagnosis not present

## 2016-09-17 ENCOUNTER — Ambulatory Visit
Admission: RE | Admit: 2016-09-17 | Discharge: 2016-09-17 | Disposition: A | Discharge status: Home / Self Care | Payer: Federal, State, Local not specified - PPO | Source: Ambulatory Visit | Attending: Radiation Oncology | Admitting: Radiation Oncology

## 2016-09-17 DIAGNOSIS — Z510 Encounter for antineoplastic radiation therapy: Secondary | ICD-10-CM | POA: Diagnosis not present

## 2016-09-18 ENCOUNTER — Ambulatory Visit
Admission: RE | Admit: 2016-09-18 | Discharge: 2016-09-18 | Disposition: A | Discharge status: Home / Self Care | Payer: Federal, State, Local not specified - PPO | Source: Ambulatory Visit | Attending: Radiation Oncology | Admitting: Radiation Oncology

## 2016-09-18 DIAGNOSIS — Z510 Encounter for antineoplastic radiation therapy: Secondary | ICD-10-CM | POA: Diagnosis not present

## 2016-09-19 ENCOUNTER — Ambulatory Visit
Admission: RE | Admit: 2016-09-19 | Discharge: 2016-09-19 | Disposition: A | Discharge status: Home / Self Care | Payer: Federal, State, Local not specified - PPO | Source: Ambulatory Visit | Attending: Radiation Oncology | Admitting: Radiation Oncology

## 2016-09-19 DIAGNOSIS — Z510 Encounter for antineoplastic radiation therapy: Secondary | ICD-10-CM | POA: Diagnosis not present

## 2016-09-22 ENCOUNTER — Ambulatory Visit: Admission: RE | Admit: 2016-09-22 | Payer: Federal, State, Local not specified - PPO | Source: Ambulatory Visit

## 2016-09-23 ENCOUNTER — Ambulatory Visit
Admission: RE | Admit: 2016-09-23 | Discharge: 2016-09-23 | Disposition: A | Discharge status: Home / Self Care | Payer: Federal, State, Local not specified - PPO | Source: Ambulatory Visit | Attending: Radiation Oncology | Admitting: Radiation Oncology

## 2016-09-23 DIAGNOSIS — Z510 Encounter for antineoplastic radiation therapy: Secondary | ICD-10-CM | POA: Diagnosis not present

## 2016-09-24 ENCOUNTER — Ambulatory Visit
Admission: RE | Admit: 2016-09-24 | Discharge: 2016-09-24 | Disposition: A | Discharge status: Home / Self Care | Payer: Federal, State, Local not specified - PPO | Source: Ambulatory Visit | Attending: Radiation Oncology | Admitting: Radiation Oncology

## 2016-09-24 DIAGNOSIS — Z510 Encounter for antineoplastic radiation therapy: Secondary | ICD-10-CM | POA: Diagnosis not present

## 2016-09-25 ENCOUNTER — Ambulatory Visit
Admission: RE | Admit: 2016-09-25 | Discharge: 2016-09-25 | Disposition: A | Discharge status: Home / Self Care | Payer: Federal, State, Local not specified - PPO | Source: Ambulatory Visit | Attending: Radiation Oncology | Admitting: Radiation Oncology

## 2016-09-25 DIAGNOSIS — Z510 Encounter for antineoplastic radiation therapy: Secondary | ICD-10-CM | POA: Diagnosis not present

## 2016-09-26 ENCOUNTER — Ambulatory Visit
Admission: RE | Admit: 2016-09-26 | Discharge: 2016-09-26 | Disposition: A | Discharge status: Home / Self Care | Payer: Federal, State, Local not specified - PPO | Source: Ambulatory Visit | Attending: Radiation Oncology | Admitting: Radiation Oncology

## 2016-09-26 DIAGNOSIS — Z510 Encounter for antineoplastic radiation therapy: Secondary | ICD-10-CM | POA: Diagnosis not present

## 2016-09-29 ENCOUNTER — Ambulatory Visit: Payer: Federal, State, Local not specified - PPO | Admitting: Radiation Oncology

## 2016-09-29 ENCOUNTER — Ambulatory Visit
Admission: RE | Admit: 2016-09-29 | Discharge: 2016-09-29 | Disposition: A | Discharge status: Home / Self Care | Payer: Federal, State, Local not specified - PPO | Source: Ambulatory Visit | Attending: Radiation Oncology | Admitting: Radiation Oncology

## 2016-09-29 DIAGNOSIS — Z510 Encounter for antineoplastic radiation therapy: Secondary | ICD-10-CM | POA: Diagnosis not present

## 2016-09-30 ENCOUNTER — Ambulatory Visit: Payer: Federal, State, Local not specified - PPO | Admitting: Radiation Oncology

## 2016-09-30 ENCOUNTER — Ambulatory Visit
Admission: RE | Admit: 2016-09-30 | Discharge: 2016-09-30 | Disposition: A | Discharge status: Home / Self Care | Payer: Federal, State, Local not specified - PPO | Source: Ambulatory Visit | Attending: Radiation Oncology | Admitting: Radiation Oncology

## 2016-09-30 DIAGNOSIS — Z510 Encounter for antineoplastic radiation therapy: Secondary | ICD-10-CM | POA: Diagnosis not present

## 2016-10-01 ENCOUNTER — Ambulatory Visit: Payer: Federal, State, Local not specified - PPO

## 2016-10-01 ENCOUNTER — Ambulatory Visit
Admission: RE | Admit: 2016-10-01 | Discharge: 2016-10-01 | Disposition: A | Discharge status: Home / Self Care | Payer: Federal, State, Local not specified - PPO | Source: Ambulatory Visit | Attending: Radiation Oncology | Admitting: Radiation Oncology

## 2016-10-01 DIAGNOSIS — Z510 Encounter for antineoplastic radiation therapy: Secondary | ICD-10-CM | POA: Diagnosis not present

## 2016-10-02 ENCOUNTER — Ambulatory Visit: Payer: Federal, State, Local not specified - PPO

## 2016-10-02 ENCOUNTER — Ambulatory Visit
Admission: RE | Admit: 2016-10-02 | Discharge: 2016-10-02 | Disposition: A | Discharge status: Home / Self Care | Payer: Federal, State, Local not specified - PPO | Source: Ambulatory Visit | Attending: Radiation Oncology | Admitting: Radiation Oncology

## 2016-10-02 DIAGNOSIS — Z510 Encounter for antineoplastic radiation therapy: Secondary | ICD-10-CM | POA: Diagnosis not present

## 2016-10-03 ENCOUNTER — Ambulatory Visit
Admission: RE | Admit: 2016-10-03 | Discharge: 2016-10-03 | Disposition: A | Discharge status: Home / Self Care | Payer: Federal, State, Local not specified - PPO | Source: Ambulatory Visit | Attending: Radiation Oncology | Admitting: Radiation Oncology

## 2016-10-03 DIAGNOSIS — Z510 Encounter for antineoplastic radiation therapy: Secondary | ICD-10-CM | POA: Diagnosis not present

## 2016-10-06 ENCOUNTER — Encounter: Payer: Self-pay | Admitting: Adult Health

## 2016-10-06 ENCOUNTER — Ambulatory Visit (HOSPITAL_BASED_OUTPATIENT_CLINIC_OR_DEPARTMENT_OTHER): Payer: Federal, State, Local not specified - PPO | Admitting: Adult Health

## 2016-10-06 ENCOUNTER — Ambulatory Visit
Admission: RE | Admit: 2016-10-06 | Discharge: 2016-10-06 | Disposition: A | Discharge status: Home / Self Care | Payer: Federal, State, Local not specified - PPO | Source: Ambulatory Visit | Attending: Radiation Oncology | Admitting: Radiation Oncology

## 2016-10-06 ENCOUNTER — Encounter: Payer: Self-pay | Admitting: *Deleted

## 2016-10-06 VITALS — BP 156/87 | HR 67 | Temp 97.7°F | Resp 18 | Ht 66.0 in | Wt 154.0 lb

## 2016-10-06 DIAGNOSIS — Z170 Estrogen receptor positive status [ER+]: Secondary | ICD-10-CM

## 2016-10-06 DIAGNOSIS — C50411 Malignant neoplasm of upper-outer quadrant of right female breast: Secondary | ICD-10-CM

## 2016-10-06 DIAGNOSIS — Z510 Encounter for antineoplastic radiation therapy: Secondary | ICD-10-CM | POA: Diagnosis not present

## 2016-10-06 DIAGNOSIS — E2839 Other primary ovarian failure: Secondary | ICD-10-CM

## 2016-10-06 MED ORDER — ANASTROZOLE 1 MG PO TABS: 1.0000 mg | ORAL_TABLET | Freq: Every day | ORAL | 3 refills | Status: DC

## 2016-10-06 NOTE — Progress Notes (Signed)
[facility]  Telephone:([phone]) [phone] [phone]) [phone]     ID: Holly Best DOB: May 21, 1950  MR#: [id number]  BDZ#:[id number]  Patient Care Team: Manon Hilding, MD as PCP - General (Cardiology) Jovita Kussmaul, MD as Consulting Physician (General Surgery) Magrinat, [name], MD as Consulting Physician (Oncology) Eppie Gibson, MD as Attending Physician (Radiation Oncology) Alden Hipp, MD as Consulting Physician (Obstetrics and Gynecology) Susa Day, MD as Consulting Physician (Orthopedic Surgery) Sandford Craze, MD as Referring Physician (Dermatology) Suella Broad, MD as Consulting Physician (Physical Medicine and Rehabilitation) Virgia Land, MD as Referring Physician (Internal Medicine) Scot Dock, NP OTHER MD:  CHIEF COMPLAINT: Estrogen receptor positive breast cancer  CURRENT TREATMENT: Awaiting definitive surgery   BREAST CANCER HISTORY: Holly Best had screening mammography July 2018 showing a possible mass in the upper-outer quadrant of the right breast and calcifications in the lower inner quadrant of the left breast. On 07/17/2016 she underwent bilateral diagnostic mammography with ultrasonography and right breast ultrasonography at the [facility]. Breast density was category C. In the left breast, a group of coarse calcifications in the lower inner quadrant measuring 1.8 cm were felt to be benign.  In the right breast there was a spiculated mass in the upper-outer quadrant which was palpable as a 2.5 cm area of thickening at the 11:00 position of the right breast. There was no palpable right axillary adenopathy. Ultrasonography of the right breast mass confirmed a 0.8 cm hypoechoic and irregular mass at the 10:30-11:00 position 6 cm from the nipple. Ultrasound of the right axilla was sonographically benign.  Biopsy of the right breast mass in question 07/22/2016 showed ([id number]) and invasive ductal carcinoma, grade 1, estrogen  receptor 100% positive, progesterone receptor 90% positive, both with strong staining intensity, with an MIB-1 of 5%, and HER-2 not amplified with a signals ratio of 1.15 and the number per cell 2.25.  The patient's subsequent history is as detailed below.  INTERVAL HISTORY: Holly Best was accompanied by her husband [name] today.  She is finishing up radiation therapy.  She has not sat down with anyone and re-reviewed everything including her pathology and oncotype results.  She is tolerating radiation therapy well.    REVIEW OF SYSTEMS: Holly Best is doing very well today and a detailed ROS was conducted and is non contributory today.   PAST MEDICAL HISTORY: Past Medical History:  Diagnosis Date  . Cancer [facility])    right breast cancer  . Crohn disease ([place])   . Depression   . Family history of breast cancer   . Hypercholesteremia     PAST SURGICAL HISTORY: Past Surgical History:  Procedure Laterality Date  . BREAST LUMPECTOMY WITH RADIOACTIVE SEED AND SENTINEL LYMPH NODE BIOPSY Right 08/06/2016   Procedure: BREAST LUMPECTOMY WITH RADIOACTIVE SEED AND SENTINEL LYMPH NODE BIOPSY;  Surgeon: Jovita Kussmaul, MD;  Location: [facility];  Service: General;  Laterality: Right;  . COLONOSCOPY    . dental implants     upper  . LUMBAR LAMINECTOMY Bilateral 03/23/2013   Procedure: MICRO LUMBAR DECOMPRESSION, FORAMINOTOMY L4-5;  Surgeon: Johnn Hai, MD;  Location: [facility];  Service: Orthopedics;  Laterality: Bilateral;    FAMILY HISTORY Family History  Problem Relation Age of Onset  . Breast cancer Sister 45  . Breast cancer Other 55  . Lung cancer Mother   . Lung cancer Father   . Breast cancer Maternal Grandmother        dx in her 53s  . Other  Brother        non-malignant spinal tumor  . Breast cancer Maternal Aunt        dx > 50  . Lupus Maternal Aunt   . Breast cancer Maternal Aunt        dx >50  The patient's father died from lung cancer at age 45. The patient's mother also died from lung  cancer at age 46. The patient has 3 brothers, one sister. The patient's sister was diagnosed with breast cancer at age 68. The patient had a niece with breast cancer (not her sister's daughter) at age 66.   GYNECOLOGIC HISTORY:  No LMP recorded. Patient is postmenopausal. Menarche age 80, first live birth age 74 the patient is [place] P1. She went through the change of life approximately age 27. She did not take hormone replacement. She never used oral contraceptives.  SOCIAL HISTORY:  Holly Best is retired from the [employer]. Her husband [name]") worked in [employer] fundraiser. Their son lives in [place] and works for [employer]. The patient has 2 grandchildren. They attend a nondenominational church and [employer]. [employer]     ADVANCED DIRECTIVES: In place   HEALTH MAINTENANCE: Social History  Substance Use Topics  . Smoking status: Former Smoker    Packs/day: 1.00    Years: 10.00    Types: Cigarettes    Quit date: 02/17/1978  . Smokeless tobacco: Never Used  . Alcohol use Yes     Comment: occassionally     Colonoscopy: 2018  PAP:  Bone density:   No Known Allergies  Current Outpatient Prescriptions  Medication Sig Dispense Refill  . aspirin EC 81 MG tablet Take 81 mg by mouth daily.    Marland Kitchen atorvastatin (LIPITOR) 10 MG tablet Take 5 mg by mouth 3 (three) times a week.    . calcium carbonate (TUMS - DOSED IN MG ELEMENTAL CALCIUM) 500 MG chewable tablet Chew 500 mg by mouth at bedtime.     Marland Kitchen HYDROcodone-acetaminophen (NORCO/VICODIN) 5-325 MG tablet Take 1-2 tablets by mouth every 4 (four) hours as needed for moderate pain or severe pain. 15 tablet 0  . LORazepam (ATIVAN) 1 MG tablet Take 1 mg by mouth at bedtime.    . naproxen (NAPROSYN) 500 MG tablet Take 500 mg by mouth at bedtime.     Marland Kitchen oxyCODONE-acetaminophen (PERCOCET/ROXICET) 5-325 MG tablet Take 1 tablet by mouth every 6 (six) hours as needed for severe pain.    Marland Kitchen PARoxetine (PAXIL) 20 MG tablet Take 20 mg by mouth every evening.      No current facility-administered medications for this visit.     OBJECTIVE:  Vitals:   10/06/16 0926  BP: (!) 156/87  Pulse: 67  Resp: 18  Temp: 97.7 F (36.5 C)  SpO2: 99%     Body mass index is 24.86 kg/m.   Wt Readings from Last 3 Encounters:  10/06/16 154 lb (69.9 kg)  08/29/16 158 lb 12.8 oz (72 kg)  07/30/16 159 lb (72.1 kg)  ECOG FS:1 - Symptomatic but completely ambulatory  GENERAL: Patient is a well appearing female in no acute distress HEENT:  Sclerae anicteric.  Oropharynx clear and moist. No ulcerations or evidence of oropharyngeal candidiasis. Neck is supple.  NODES:  No cervical, supraclavicular, or axillary lymphadenopathy palpated.  BREAST EXAM:   LUNGS:  Clear to auscultation bilaterally.  No wheezes or rhonchi. HEART:  Regular rate and rhythm. No murmur appreciated. ABDOMEN:  Soft, nontender.  Positive, normoactive bowel sounds. No organomegaly palpated.  MSK:  No focal spinal tenderness to palpation. Full range of motion bilaterally in the upper extremities. EXTREMITIES:  No peripheral edema.   SKIN:  Clear with no obvious rashes or skin changes. No nail dyscrasia. NEURO:  Nonfocal. Well oriented.  Appropriate affect.     LAB RESULTS:  CMP     Component Value Date/Time   NA 143 07/30/2016 1221   K 4.4 07/30/2016 1221   CL 102 03/21/2013 1325   CO2 26 07/30/2016 1221   GLUCOSE 91 07/30/2016 1221   BUN 16.0 07/30/2016 1221   CREATININE 1.0 07/30/2016 1221   CALCIUM 9.0 07/30/2016 1221   PROT 6.5 07/30/2016 1221   ALBUMIN 3.9 07/30/2016 1221   AST 15 07/30/2016 1221   ALT 11 07/30/2016 1221   ALKPHOS 81 07/30/2016 1221   BILITOT 0.65 07/30/2016 1221   GFRNONAA 76 (L) 03/21/2013 1325   GFRAA 88 (L) 03/21/2013 1325    No results found for: TOTALPROTELP, ALBUMINELP, A1GS, A2GS, BETS, BETA2SER, GAMS, MSPIKE, SPEI  No results found for: Nils Pyle, [facility]  Lab Results  Component Value Date   WBC 6.3 07/30/2016    NEUTROABS 4.4 07/30/2016   HGB 12.2 07/30/2016   HCT 36.3 07/30/2016   MCV 96.0 07/30/2016   PLT 318 07/30/2016      Chemistry      Component Value Date/Time   NA 143 07/30/2016 1221   K 4.4 07/30/2016 1221   CL 102 03/21/2013 1325   CO2 26 07/30/2016 1221   BUN 16.0 07/30/2016 1221   CREATININE 1.0 07/30/2016 1221      Component Value Date/Time   CALCIUM 9.0 07/30/2016 1221   ALKPHOS 81 07/30/2016 1221   AST 15 07/30/2016 1221   ALT 11 07/30/2016 1221   BILITOT 0.65 07/30/2016 1221       No results found for: LABCA2  No components found for: [id number]  No results for input(s): INR in the last 168 hours.  Urinalysis No results found for: COLORURINE, APPEARANCEUR, LABSPEC, PHURINE, GLUCOSEU, HGBUR, BILIRUBINUR, KETONESUR, PROTEINUR, UROBILINOGEN, NITRITE, LEUKOCYTESUR   STUDIES: No results found.  ELIGIBLE FOR AVAILABLE RESEARCH PROTOCOL: no  ASSESSMENT: 66 y.o. [place], [place] woman status post right breast upper outer quadrant biopsy 07/22/2016 for a clinical T1b N0, stage IA invasive ductal carcinoma, grade 1, estrogen and progesterone receptor positive, HER-2 nonamplified, with an MIB-1 of 5%.  (1) Right lumpectomy 08/06/2016: IDC, grade 1, 0.7cm, margins negative, 3 SLN negative, T1b, N0  (2) Genetics on 08/19/2016: Genetic testing was normal and revealed no deleterious mutations.  Genes tested include: APC, ATM, AXIN2, BARD1, BMPR1A, BRCA1, BRCA2, BRIP1, CDH1, CDKN2A (p14ARF), CDKN2A (p16INK4a), CHEK2, CTNNA1, DICER1, EPCAM (Deletion/duplication testing only), GREM1 (promoter region deletion/duplication testing only), KIT, MEN1, MLH1, MSH2, MSH3, MSH6, MUTYH, NBN, NF1, NHTL1, PALB2, PDGFRA, PMS2, POLD1, POLE, PTEN, RAD50, RAD51C, RAD51D, SDHB, SDHC, SDHD, SMAD4, SMARCA4. STK11, TP53, TSC1, TSC2, and VHL.  The following genes were evaluated for sequence changes only: SDHA and HOXB13 c.251G>A variant only.   (3) Oncotype DX: 16/10%, low risk  (4) adjuvant radiation with Dr.  Isidore Moos  09/10/2016-10/08/16  (5) Anastrozole to start in 11/2016  PLAN: Kennyth Lose and I reviewed her plan this far.  She and I reviewed anti estrogen therapies and their role in her treatment.  We reviewed Anastrozole.  She and I discussed side effects in detail.  I gave her detailed information about this in her AVS.  I also reviewed bone density testing with her in detail and have ordered  for it to be done at the breast center.  She will return in about 6 weeks for follow up with Dr. Jana Hakim.  I have tentatively scheduled a Survivorship Care Plan visit in 02/2017.  She will f/u with Dr. Marlou Starks next month.    Nidya has a good understanding of the overall plan. She agrees with it. She knows the goal of treatment in her case is cure. She will call with any problems that may develop before her next visit here.  A total of (30) minutes of face-to-face time was spent with this patient with greater than 50% of that time in counseling and care-coordination.   Scot Dock, NP   10/06/2016 9:37 AM Medical Oncology and Hematology [facility] [address] [place], [place] [address] Tel. [phone]    Fax. [phone]

## 2016-10-06 NOTE — Patient Instructions (Signed)

## 2016-10-07 ENCOUNTER — Ambulatory Visit: Payer: Federal, State, Local not specified - PPO

## 2016-10-07 ENCOUNTER — Ambulatory Visit
Admission: RE | Admit: 2016-10-07 | Discharge: 2016-10-07 | Disposition: A | Discharge status: Home / Self Care | Payer: Federal, State, Local not specified - PPO | Source: Ambulatory Visit | Attending: Radiation Oncology | Admitting: Radiation Oncology

## 2016-10-07 DIAGNOSIS — Z510 Encounter for antineoplastic radiation therapy: Secondary | ICD-10-CM | POA: Diagnosis not present

## 2016-10-08 ENCOUNTER — Ambulatory Visit
Admission: RE | Admit: 2016-10-08 | Discharge: 2016-10-08 | Disposition: A | Discharge status: Home / Self Care | Payer: Federal, State, Local not specified - PPO | Source: Ambulatory Visit | Attending: Radiation Oncology | Admitting: Radiation Oncology

## 2016-10-08 DIAGNOSIS — Z510 Encounter for antineoplastic radiation therapy: Secondary | ICD-10-CM | POA: Diagnosis not present

## 2016-10-10 ENCOUNTER — Encounter: Payer: Self-pay | Admitting: Radiation Oncology

## 2016-10-10 NOTE — Progress Notes (Signed)
  Radiation Oncology         [phone] ________________________________  Name: Holly Best MRN: [id number]  Date: 10/10/2016  DOB: November 03, 1950  End of Treatment Note  Diagnosis:   Clinical Stage I T1bN0M0 RightBreast UIQ Invasive Ductal Carcinoma, ER(+) / PR(+)/ Her2(-), Grade 1, Pathologic Stage IA T1bN0M0     Indication for treatment:  Curative       Radiation treatment dates:   09/10/2016-10/08/2016  Site/dose:   1. Right breast, 2.67 Gy x 15 fractions   2. Boost, 2 Gy x 5 fractions  Beams/energy:   1. 3D, 6X        2. Electron, 12E  Narrative: The patient tolerated radiation treatment relatively well. At the start of the patient treatment she was noted to not have fatigue or radiation related skin changes. Patient was given radiaplex to use at that time. Toward the end of the patient treatment, she noted itching to her right breast with mild hyperpigmentation and fatigue. Pt noted that she was still using radiaplex. On pt physical exam, she had erythema to right breast with skin intact. Pt was advised to use hydrocortisone cream for itching along with radiaplex.    Plan: The patient has completed radiation treatment. The patient will return to radiation oncology clinic for routine followup in one month. I advised them to call or return sooner if they have any questions or concerns related to their recovery or treatment.  -----------------------------------  Eppie Gibson, MD  This document serves as a record of services personally performed by Eppie Gibson, MD. It was created on her behalf by Marlowe Kays, a trained medical scribe. The creation of this record is based on the scribe's personal observations and the provider's statements to them. This document has been checked and approved by the attending provider.

## 2016-11-05 ENCOUNTER — Encounter: Payer: Self-pay | Admitting: Radiation Oncology

## 2016-11-07 ENCOUNTER — Encounter: Payer: Self-pay | Admitting: Radiation Oncology

## 2016-11-07 ENCOUNTER — Ambulatory Visit
Admission: RE | Admit: 2016-11-07 | Discharge: 2016-11-07 | Disposition: A | Discharge status: Home / Self Care | Payer: Federal, State, Local not specified - PPO | Source: Ambulatory Visit | Attending: Radiation Oncology | Admitting: Radiation Oncology

## 2016-11-07 DIAGNOSIS — Z170 Estrogen receptor positive status [ER+]: Secondary | ICD-10-CM

## 2016-11-07 DIAGNOSIS — Z08 Encounter for follow-up examination after completed treatment for malignant neoplasm: Secondary | ICD-10-CM | POA: Insufficient documentation

## 2016-11-07 DIAGNOSIS — C50411 Malignant neoplasm of upper-outer quadrant of right female breast: Secondary | ICD-10-CM

## 2016-11-07 DIAGNOSIS — Z853 Personal history of malignant neoplasm of breast: Secondary | ICD-10-CM | POA: Insufficient documentation

## 2016-11-07 HISTORY — DX: Personal history of irradiation: Z92.3

## 2016-11-07 NOTE — Progress Notes (Signed)
Radiation Oncology         [phone] ________________________________  Name: Holly Best MRN: [id number]  Date: 11/07/2016  DOB: 1950-07-11  Follow-Up Visit Note  Outpatient  CC: Manon Hilding, MD  Magrinat, [name], MD   ICD-10-CM   1. Malignant neoplasm of upper-outer quadrant of right breast in female, estrogen receptor positive ([place]) C50.411    Z17.0     Diagnosis:   Clinical Stage I T1bN0M0 RightBreast UIQ Invasive Ductal Carcinoma, ER(+) / PR(+)/ Her2(-), Grade 1, Pathologic Stage IA T1bN0M0  Previous Radiation:  50.05 Gy in 20 fractions Completed on 10/08/2016  Narrative:  The patient returns today for routine follow-up of radiation completed 10/08/2016 to her right breast. She is accompanied by her husband. She is doing well. She denies any new palpable lumps or bumps in her breasts. She continues to be followed by medical oncology and says she has not started her Anastrozole yet. She plans to call Dr. [name] office today to clarify when she should start it.     On review of systems, she reports soreness to her right flank/axilla area upon palpation. She also continues to have a deep burning/throbbing in her lateral right breast. She reports the skin to her right breast has healed well and continues to have hyperpigmentation. She is currently using her own cream to her right breast.                        ALLERGIES:  has No Known Allergies.  Meds: Current Outpatient Prescriptions  Medication Sig Dispense Refill  . aspirin EC 81 MG tablet Take 81 mg by mouth daily.    Marland Kitchen atorvastatin (LIPITOR) 10 MG tablet Take 5 mg by mouth 3 (three) times a week.    . calcium carbonate (TUMS - DOSED IN MG ELEMENTAL CALCIUM) 500 MG chewable tablet Chew 500 mg by mouth at bedtime.     Marland Kitchen LORazepam (ATIVAN) 1 MG tablet Take 1 mg by mouth at bedtime.    . naproxen (NAPROSYN) 500 MG tablet Take 500 mg by mouth at bedtime.     Marland Kitchen oxyCODONE-acetaminophen (PERCOCET/ROXICET) 5-325 MG  tablet Take 1 tablet by mouth every 6 (six) hours as needed for severe pain.    Marland Kitchen PARoxetine (PAXIL) 20 MG tablet Take 20 mg by mouth every evening.    Marland Kitchen anastrozole (ARIMIDEX) 1 MG tablet Take 1 tablet (1 mg total) by mouth daily. (Patient not taking: Reported on 11/07/2016) 30 tablet 3  . HYDROcodone-acetaminophen (NORCO/VICODIN) 5-325 MG tablet Take 1-2 tablets by mouth every 4 (four) hours as needed for moderate pain or severe pain. (Patient not taking: Reported on 11/07/2016) 15 tablet 0   No current facility-administered medications for this encounter.    Review of Systems: A 10+ POINT REVIEW OF SYSTEMS WAS OBTAINED including neurology, dermatology, psychiatry, cardiac, respiratory, lymph, extremities, GI, GU, Musculoskeletal, constitutional, breasts, reproductive, HEENT.  All pertinent positives are noted in the HPI.  All others are negative.  Physical Findings:  height is 5' 6"  (1.676 m) and weight is 157 lb 9.6 oz (71.5 kg). Her temperature is 97.9 F (36.6 C). Her blood pressure is 145/90 (abnormal) and her pulse is 67. Her oxygen saturation is 98%.   General: Alert and oriented, in no acute distress. BREASTS: No lesions palpated in breasts or axillary regions bilaterally. SKIN: Her skin is still somewhat dry over her right breast and axilla with some hyperpigmentation but overall is healing well.  Lab Findings: Lab Results  Component Value Date   WBC 6.3 07/30/2016   HGB 12.2 07/30/2016   HCT 36.3 07/30/2016   MCV 96.0 07/30/2016   PLT 318 07/30/2016       Radiographic Findings: No results found.  Impression/Plan: This is a very pleasant woman with a history of right breast cancer.  She knows to continue yearly mammography and routine follow-up in medical oncology.  I advised her to apply Vitamin E lotion to promote skin healing. I will see her back prn. I look forward to seeing her at future Survivorship celebrations.  I encouraged her to call if she has any issues in the  interim.      _____________________________________   Eppie Gibson, MD  This document serves as a record of services personally performed by Eppie Gibson, MD. It was created on her behalf by Rae Lips, a trained medical scribe. The creation of this record is based on the scribe's personal observations and the provider's statements to them. This document has been checked and approved by the attending provider.

## 2016-11-07 NOTE — Progress Notes (Signed)
Holly Best presents for follow up of radiation completed 10/08/16 to her Right Breast. She reports soreness to her Right flank/ axilla area. It will only hurt when it is touched. She also continues to have a burning/throbbing deep in her lateral right breast. She has not started her anastrozole yet, and plans to call Dr. [name] office today to clarify when she should start it. She will see Dr. Jana Hakim on 12/09/16 and survivorship on 02/09/17. She reports the skin to her Right Breast has healed well and continues to have hyperpigmentation. She is using her own cream to her Right Breast. I have asked her to use Vitamin E cream to her radiation site to continue to help her heal.   BP (!) 145/90   Pulse 67   Temp 97.9 F (36.6 C)   Ht 5' 6"  (1.676 m)   Wt 157 lb 9.6 oz (71.5 kg)   SpO2 98% Comment: room air  BMI 25.44 kg/m    Wt Readings from Last 3 Encounters:  11/07/16 157 lb 9.6 oz (71.5 kg)  10/06/16 154 lb (69.9 kg)  08/29/16 158 lb 12.8 oz (72 kg)

## 2016-11-10 ENCOUNTER — Ambulatory Visit
Admission: RE | Admit: 2016-11-10 | Discharge: 2016-11-10 | Disposition: A | Discharge status: Home / Self Care | Payer: Federal, State, Local not specified - PPO | Source: Ambulatory Visit | Attending: Adult Health | Admitting: Adult Health

## 2016-11-10 ENCOUNTER — Telehealth: Payer: Self-pay

## 2016-11-10 DIAGNOSIS — E2839 Other primary ovarian failure: Secondary | ICD-10-CM

## 2016-11-10 NOTE — Telephone Encounter (Signed)
Patient left VM inquiring when to start anastrozole since she is supposed to start after Tx.  Rx states to start 11/2016.  LPN spoke with NP to clarify and ok to start now.  Made patient aware.  She understood and had no more questions at this time.

## 2016-11-11 ENCOUNTER — Telehealth: Payer: Self-pay

## 2016-11-11 NOTE — Telephone Encounter (Signed)
Spoke with patient to let her know that bone density scan was normal.  She was very relieved to hear.  Patient voiced no other questions or concerns at this time.

## 2016-11-11 NOTE — Telephone Encounter (Signed)
-----   Message from Gardenia Phlegm, NP sent at 11/10/2016  8:54 PM EST ----- Please let patient know that her bone density is normal.   ----- Message ----- From: Interface, Rad Results In Sent: 11/10/2016   2:21 PM To: Gardenia Phlegm, NP

## 2016-12-05 ENCOUNTER — Other Ambulatory Visit: Payer: Self-pay | Admitting: *Deleted

## 2016-12-05 DIAGNOSIS — Z170 Estrogen receptor positive status [ER+]: Principal | ICD-10-CM

## 2016-12-05 DIAGNOSIS — C50411 Malignant neoplasm of upper-outer quadrant of right female breast: Secondary | ICD-10-CM

## 2016-12-08 ENCOUNTER — Other Ambulatory Visit (HOSPITAL_BASED_OUTPATIENT_CLINIC_OR_DEPARTMENT_OTHER): Payer: Federal, State, Local not specified - PPO

## 2016-12-08 DIAGNOSIS — C50411 Malignant neoplasm of upper-outer quadrant of right female breast: Secondary | ICD-10-CM

## 2016-12-08 DIAGNOSIS — Z170 Estrogen receptor positive status [ER+]: Principal | ICD-10-CM

## 2016-12-08 LAB — CBC WITH DIFFERENTIAL/PLATELET
BASO%: 2 % (ref 0.0–2.0)
Basophils Absolute: 0.1 10*3/uL (ref 0.0–0.1)
EOS%: 4.7 % (ref 0.0–7.0)
Eosinophils Absolute: 0.2 10*3/uL (ref 0.0–0.5)
HCT: 37 % (ref 34.8–46.6)
HEMOGLOBIN: 12.6 g/dL (ref 11.6–15.9)
LYMPH%: 26.3 % (ref 14.0–49.7)
MCH: 32.8 pg (ref 25.1–34.0)
MCHC: 34.2 g/dL (ref 31.5–36.0)
MCV: 95.9 fL (ref 79.5–101.0)
MONO#: 0.5 10*3/uL (ref 0.1–0.9)
MONO%: 10.2 % (ref 0.0–14.0)
NEUT%: 56.8 % (ref 38.4–76.8)
NEUTROS ABS: 2.6 10*3/uL (ref 1.5–6.5)
Platelets: 312 10*3/uL (ref 145–400)
RBC: 3.86 10*6/uL (ref 3.70–5.45)
RDW: 13 % (ref 11.2–14.5)
WBC: 4.6 10*3/uL (ref 3.9–10.3)
lymph#: 1.2 10*3/uL (ref 0.9–3.3)

## 2016-12-08 LAB — COMPREHENSIVE METABOLIC PANEL
ALBUMIN: 4.3 g/dL (ref 3.5–5.0)
ALK PHOS: 82 U/L (ref 40–150)
ALT: 16 U/L (ref 0–55)
ANION GAP: 9 meq/L (ref 3–11)
AST: 18 U/L (ref 5–34)
BUN: 14.3 mg/dL (ref 7.0–26.0)
CALCIUM: 9.9 mg/dL (ref 8.4–10.4)
CHLORIDE: 105 meq/L (ref 98–109)
CO2: 27 mEq/L (ref 22–29)
Creatinine: 0.9 mg/dL (ref 0.6–1.1)
EGFR: >60 mL/min/{1.73_m2} (ref >60)
Glucose: 83 mg/dl (ref 70–140)
POTASSIUM: 3.8 meq/L (ref 3.5–5.1)
Sodium: 142 mEq/L (ref 136–145)
Total Bilirubin: 1.02 mg/dL (ref 0.20–1.20)
Total Protein: 7.3 g/dL (ref 6.4–8.3)

## 2016-12-08 NOTE — Progress Notes (Signed)
[facility]  [phone] [phone]) [phone]     ID: Holly Best DOB: [birth year]  MR#: [id number]  UXN#:[id number]  Patient Care Team: Manon Hilding, MD as PCP - General (Cardiology) Jovita Kussmaul, MD as Consulting Physician (General Surgery) Audiel Scheiber, [name], MD as Consulting Physician (Oncology) Eppie Gibson, MD as Attending Physician (Radiation Oncology) Alden Hipp, MD as Consulting Physician (Obstetrics and Gynecology) Susa Day, MD as Consulting Physician (Orthopedic Surgery) Sandford Craze, MD as Referring Physician (Dermatology) Suella Broad, MD as Consulting Physician (Physical Medicine and Rehabilitation) Virgia Land, MD as Referring Physician (Internal Medicine) OTHER MD:  CHIEF COMPLAINT: Estrogen receptor positive breast cancer  CURRENT TREATMENT: Anastrozole   BREAST CANCER HISTORY: From the original intake note:  Holly Best had screening mammography July 2018 showing a possible mass in the upper-outer quadrant of the right breast and calcifications in the lower inner quadrant of the left breast. On 07/17/2016 she underwent bilateral diagnostic mammography with ultrasonography and right breast ultrasonography at the [facility]. Breast density was category C. In the left breast, a group of coarse calcifications in the lower inner quadrant measuring 1.8 cm were felt to be benign.  In the right breast there was a spiculated mass in the upper-outer quadrant which was palpable as a 2.5 cm area of thickening at the 11:00 position of the right breast. There was no palpable right axillary adenopathy. Ultrasonography of the right breast mass confirmed a 0.8 cm hypoechoic and irregular mass at the 10:30-11:00 position 6 cm from the nipple. Ultrasound of the right axilla was sonographically benign.  Biopsy of the right breast mass in question 07/22/2016 showed ([id number]) and invasive ductal carcinoma, grade 1, estrogen receptor  100% positive, progesterone receptor 90% positive, both with strong staining intensity, with an MIB-1 of 5%, and HER-2 not amplified with a signals ratio of 1.15 and the number per cell 2.25.  The patient's subsequent history is as detailed below.  INTERVAL HISTORY: Holly Best returns today for follow-up and treatment of her estrogen receptor positive breast cancer accompanied by her husband, [name] today. She started anastrozole on 11/06/2016. She notes that since starting anastrozole, she has been very moody lately. She reports that she cries and gets mad easily. She reports no change in hot flashes. She experiences vaginal dryness, but she reports she currently does not relieve this. She does not pay much for this medication.  She has completed radiation therapy treatments which consisted of: Radiation treatment dates of 09/10/2016-10/08/2016. She was treated to the site and dose of: 1. Right breast, 2.67 Gy in 15 fractions. 2. Boost, 2 Gy in 5 fractions.   She underwent a bone density scan on 11/10/2016 with results showing: T-score of -1 at the left forearm radius.     REVIEW OF SYSTEMS: Holly Best reports that both of her breasts are sore. She notes shooting pains in her right breast from the lumpectomy. She also reports that yesterday, she felt a burning sensation that lasted about 10 seconds. She finds that holding pressure on it helps. She is mostly sore in the right armpit. She notes that on some days she feels right arm weakness, and on other days she doesn't notice. She reports that she hasn't done physical therapy for ROM exercises yet. She notes that she did well with her pain following lumpectomy, and she had some fatigue from the radiation, but it did not impact her daily activities. For exercise she has been raking leaves and doing yardwork. She also takes  care of her horses. She denies unusual headaches, visual changes, nausea, vomiting, or dizziness. There has been no unusual cough, phlegm  production, or pleurisy. This been no change in bowel or bladder habits. She denies unexplained fatigue or unexplained weight loss, bleeding, rash, or fever. A detailed review of systems was otherwise stable.     PAST MEDICAL HISTORY: Past Medical History:  Diagnosis Date  . Cancer [facility])    right breast cancer  . Crohn disease ([place])   . Depression   . Family history of breast cancer   . History of radiation therapy 09/10/16- 10/08/16   Right Breast 2.67 Gy in 15 fractions, Right Breast boost, 2 Gy in 5 fractions.   . Hypercholesteremia     PAST SURGICAL HISTORY: Past Surgical History:  Procedure Laterality Date  . BREAST LUMPECTOMY WITH RADIOACTIVE SEED AND SENTINEL LYMPH NODE BIOPSY Right 08/06/2016   Procedure: BREAST LUMPECTOMY WITH RADIOACTIVE SEED AND SENTINEL LYMPH NODE BIOPSY;  Surgeon: Jovita Kussmaul, MD;  Location: [facility];  Service: General;  Laterality: Right;  . COLONOSCOPY    . dental implants     upper  . LUMBAR LAMINECTOMY Bilateral 03/23/2013   Procedure: MICRO LUMBAR DECOMPRESSION, FORAMINOTOMY L4-5;  Surgeon: Johnn Hai, MD;  Location: [facility];  Service: Orthopedics;  Laterality: Bilateral;    FAMILY HISTORY Family History  Problem Relation Age of Onset  . Breast cancer Sister 77  . Breast cancer Other 55  . Lung cancer Mother   . Lung cancer Father   . Breast cancer Maternal Grandmother        dx in her 49s  . Other Brother        non-malignant spinal tumor  . Breast cancer Maternal Aunt        dx > 50  . Lupus Maternal Aunt   . Breast cancer Maternal Aunt        dx >50  The patient's father died from lung cancer at age 77. The patient's mother also died from lung cancer at age 9. The patient has 3 brothers, one sister. The patient's sister was diagnosed with breast cancer at age 80. The patient had a niece with breast cancer (not her sister's daughter) at age 65.   GYNECOLOGIC HISTORY:  No LMP recorded. Patient is postmenopausal. Menarche age 34,  first live birth age 3 the patient is Barnes P1. She went through the change of life approximately age 18. She did not take hormone replacement. She never used oral contraceptives.  SOCIAL HISTORY:  Holly Best is retired from the [employer]. Her husband [name]") worked in [employer] fund[employer]. Their son lives in [place] and works for [employer]. The patient has 2 grandchildren. They attend a nondenominational church and [employer]     ADVANCED DIRECTIVES: In place   HEALTH MAINTENANCE: Social History   Tobacco Use  . Smoking status: Former Smoker    Packs/day: 1.00    Years: 10.00    Pack years: 10.00    Types: Cigarettes    Last attempt to quit: 02/17/1978    Years since quitting: 38.8  . Smokeless tobacco: Never Used  Substance Use Topics  . Alcohol use: Yes    Comment: occassionally  . Drug use: No     Colonoscopy: 2018  PAP:  Bone density:   No Known Allergies  Current Outpatient Medications  Medication Sig Dispense Refill  . anastrozole (ARIMIDEX) 1 MG tablet Take 1 tablet (1 mg total) by mouth daily. [place]  tablet 3  . aspirin EC 81 MG tablet Take 81 mg by mouth daily.    Marland Kitchen atorvastatin (LIPITOR) 10 MG tablet Take 5 mg by mouth 3 (three) times a week.    . calcium carbonate (TUMS - DOSED IN MG ELEMENTAL CALCIUM) 500 MG chewable tablet Chew 500 mg by mouth at bedtime.     Marland Kitchen LORazepam (ATIVAN) 1 MG tablet Take 1 mg by mouth at bedtime.    . naproxen (NAPROSYN) 500 MG tablet Take 500 mg by mouth at bedtime.     Marland Kitchen oxyCODONE-acetaminophen (PERCOCET/ROXICET) 5-325 MG tablet Take 1 tablet by mouth every 6 (six) hours as needed for severe pain.    Marland Kitchen PARoxetine (PAXIL) 20 MG tablet Take 20 mg by mouth every evening.     No current facility-administered medications for this visit.     OBJECTIVE: Middle-aged white woman who appears stated age  26:   12/09/16 1005  BP: 134/78  Pulse: 76  Resp: 18  Temp: 98.1 F (36.7 C)  SpO2: 100%     Body mass index is 25.78 kg/m.     Wt Readings from Last 3 Encounters:  12/09/16 159 lb 11.2 oz (72.4 kg)  11/07/16 157 lb 9.6 oz (71.5 kg)  10/06/16 154 lb (69.9 kg)  ECOG FS:1 - Symptomatic but completely ambulatory    Sclerae unicteric, EOMs intact Oropharynx clear and moist No cervical or supraclavicular adenopathy Lungs no rales or rhonchi Heart regular rate and rhythm Abd soft, nontender, positive bowel sounds MSK mild lumbar spinal tenderness, no upper extremity lymphedema Neuro: nonfocal, well oriented, appropriate affect Breasts: The right breast is status post lumpectomy and radiation.  The cosmetic result is excellent.  There is no skin change of concern, there are no suspicious masses.  The left breast is benign.  Both axillae are benign.   LAB RESULTS:  CMP     Component Value Date/Time   NA 142 12/08/2016 0942   K 3.8 12/08/2016 0942   CL 102 03/21/2013 1325   CO2 27 12/08/2016 0942   GLUCOSE 83 12/08/2016 0942   BUN 14.3 12/08/2016 0942   CREATININE 0.9 12/08/2016 0942   CALCIUM 9.9 12/08/2016 0942   PROT 7.3 12/08/2016 0942   ALBUMIN 4.3 12/08/2016 0942   AST 18 12/08/2016 0942   ALT 16 12/08/2016 0942   ALKPHOS 82 12/08/2016 0942   BILITOT 1.02 12/08/2016 0942   GFRNONAA 76 (L) 03/21/2013 1325   GFRAA 88 (L) 03/21/2013 1325    No results found for: TOTALPROTELP, ALBUMINELP, A1GS, A2GS, BETS, BETA2SER, GAMS, MSPIKE, SPEI  No results found for: Nils Pyle, [facility]  Lab Results  Component Value Date   WBC 4.6 12/08/2016   NEUTROABS 2.6 12/08/2016   HGB 12.6 12/08/2016   HCT 37.0 12/08/2016   MCV 95.9 12/08/2016   PLT 312 12/08/2016      Chemistry      Component Value Date/Time   NA 142 12/08/2016 0942   K 3.8 12/08/2016 0942   CL 102 03/21/2013 1325   CO2 27 12/08/2016 0942   BUN 14.3 12/08/2016 0942   CREATININE 0.9 12/08/2016 0942      Component Value Date/Time   CALCIUM 9.9 12/08/2016 0942   ALKPHOS 82 12/08/2016 0942   AST 18 12/08/2016 0942    ALT 16 12/08/2016 0942   BILITOT 1.02 12/08/2016 0942       No results found for: LABCA2  No components found for: MEQAST419  No results for input(s): INR in  the last 168 hours.  Urinalysis No results found for: COLORURINE, APPEARANCEUR, LABSPEC, [place], GLUCOSEU, HGBUR, BILIRUBINUR, KETONESUR, PROTEINUR, UROBILINOGEN, NITRITE, LEUKOCYTESUR   STUDIES: Dg Bone Density  Result Date: 11/10/2016 EXAM: DUAL X-RAY ABSORPTIOMETRY (DXA) FOR BONE MINERAL DENSITY IMPRESSION: Referring Physician:  Charlestine Massed CAUSEY PATIENT: Name: Holly Best, Holly Best Patient ID:  [id number] Birth Date:  [birth year]       Height:     64.0 in. Sex:         Female Measured:  11/10/2016        Weight:     158.0 lbs. Indications: Breast Cancer History, Caucasian, Estrogen Deficient, Postmenopausal, Tums Fractures: None Treatments: None ASSESSMENT: The BMD measured at Forearm Radius 33% is 0.795 g/cm2 with a T-score of -1.0. This patient is considered NORMAL according to [facility]) criteria. Lumbar spine was not utilized due to advanced degenerative changes. Site Region Measured Date Measured Age YA T-score BMD Significant CHANGE Left Forearm Radius 33% 11/10/2016 66.5 -1.0 0.795 g/cm2 DualFemur Total Right 11/10/2016 66.5 -0.2 0.986 g/cm2 World Health Organization [facility]) criteria for post-menopausal, Caucasian Women: Normal       T-score at or above -1 SD Osteopenia   T-score between -1 and -2.5 SD Osteoporosis T-score at or below -2.5 SD RECOMMENDATION: [place] recommends that FDA-approved medical therapies be considered in postmenopausal women and men age 42 or older with a: 1. Hip or vertebral (clinical or morphometric) fracture. 2. T-score of <-2.5 at the spine or hip. 3. Ten-year fracture probability by FRAX of 3% or greater for hip fracture or 20% or greater for major osteoporotic fracture. All treatment decisions require clinical judgment and consideration of individual  patient factors, including patient preferences, co-morbidities, previous drug use, risk factors not captured in the FRAX model (e.g. falls, vitamin D deficiency, increased bone turnover, interval significant decline in bone density) and possible under - or over-estimation of fracture risk by FRAX. All patients should ensure an adequate intake of dietary calcium (1200 mg/d) and vitamin D (800 IU daily) unless contraindicated. FOLLOW-UP: People with diagnosed cases of osteoporosis or at high risk for fracture should have regular bone mineral density tests. For patients eligible for Medicare, routine testing is allowed once every 2 years. The testing frequency can be increased to one year for patients who have rapidly progressing disease, those who are receiving or discontinuing medical therapy to restore bone mass, or have additional risk factors. I have reviewed this report, and agree with the above findings. Mark A. Thornton Papas, M.D. [facility] Electronically Signed   By: Lavonia Dana M.D.   On: 11/10/2016 14:15    ELIGIBLE FOR AVAILABLE RESEARCH PROTOCOL: no  ASSESSMENT: 66 y.o. [place], [place] woman status post right breast upper outer quadrant biopsy 07/22/2016 for a clinical T1b N0, stage IA invasive ductal carcinoma, grade 1, estrogen and progesterone receptor positive, HER-2 nonamplified, with an MIB-1 of 5%.  (1) Right lumpectomy 08/06/2016 showed a pT1b pN0, stage IA invasive ductal carcinoma, grade 1, with negative margins.    (2) Genetics testing on 08/19/2016 revealed no deleterious mutations APC, ATM, AXIN2, BARD1, BMPR1A, BRCA1, BRCA2, BRIP1, CDH1, CDKN2A (p14ARF), CDKN2A (p16INK4a), CHEK2, CTNNA1, DICER1, EPCAM (Deletion/duplication testing only), GREM1 (promoter region deletion/duplication testing only), KIT, MEN1, MLH1, MSH2, MSH3, MSH6, MUTYH, NBN, NF1, NHTL1, PALB2, PDGFRA, PMS2, POLD1, POLE, PTEN, RAD50, RAD51C, RAD51D, SDHB, SDHC, SDHD, SMAD4, SMARCA4. STK11, TP53, TSC1, TSC2, and VHL.  The  following genes were evaluated for sequence changes only: SDHA and HOXB13 c.251G>A variant  only.   (3) Oncotype DX: Score of 16 predicts a 10-year risk of recurrence outside the breast of 10% of the patient's only systemic therapy is tamoxifen for 5 years.  It also predicts no benefit from chemotherapy.  (4) adjuvant radiation 09/10/2016-10/08/2016 Site/dose:   1. Right breast, 2.67 Gy in 15 fractions                         2. Boost, 2 Gy in 5 fractions    (5) Anastrozole to start in 11/2016  (a) bone density 11/10/2016 showed a T score of -1.0 (normal)  PLAN: Holly Best has completed her local therapy for the breast cancer and generally did well with those treatments.  She is now on anastrozole and so far is tolerating it well.  She is experiencing some posttraumatic stress symptoms.  These are extremely common in my patients.  We discussed this at length today.  I am going to add Wellbutrin to her Paxil.  She will let me know if things are not a little bit better within the next few months  She will benefit from physical therapy to the right upper extremity and lymphedema teaching and she will also benefit from participating in our pelvic health program.  I also gave her information on the "finding your new normal" program and encouraged her to participate.  She is going to return to see me in April.  Likely I will then see her again in October and then possibly yearly.  She knows to call for any problems that may develop before the next visit here.Jana Hakim, [name], MD  12/09/16 10:35 AM Medical Oncology and Hematology [facility] [address] [place], [place] [address] Tel. [phone]    Fax. [phone]    This document serves as a record of services personally performed by Lurline Del, MD. It was created on his behalf by Sheron Nightingale, a trained medical scribe. The creation of this record is based on the scribe's personal observations and the provider's  statements to them.   I have reviewed the above documentation for accuracy and completeness, and I agree with the above.

## 2016-12-09 ENCOUNTER — Telehealth: Payer: Self-pay | Admitting: Oncology

## 2016-12-09 ENCOUNTER — Ambulatory Visit (HOSPITAL_BASED_OUTPATIENT_CLINIC_OR_DEPARTMENT_OTHER): Payer: Federal, State, Local not specified - PPO | Admitting: Oncology

## 2016-12-09 VITALS — BP 134/78 | HR 76 | Temp 98.1°F | Resp 18 | Ht 66.0 in | Wt 159.7 lb

## 2016-12-09 DIAGNOSIS — Z170 Estrogen receptor positive status [ER+]: Secondary | ICD-10-CM

## 2016-12-09 DIAGNOSIS — Z79811 Long term (current) use of aromatase inhibitors: Secondary | ICD-10-CM | POA: Diagnosis not present

## 2016-12-09 DIAGNOSIS — C50411 Malignant neoplasm of upper-outer quadrant of right female breast: Secondary | ICD-10-CM | POA: Diagnosis not present

## 2016-12-09 MED ORDER — BUPROPION HCL ER (XL) 150 MG PO TB24: 150.0000 mg | ORAL_TABLET | Freq: Every day | ORAL | 4 refills | Status: DC

## 2016-12-09 NOTE — Telephone Encounter (Signed)
Gave patient AVS and calendar of upcoming April 2019 appointments.

## 2017-01-19 ENCOUNTER — Ambulatory Visit: Payer: Federal, State, Local not specified - PPO | Admitting: Physical Therapy

## 2017-01-27 ENCOUNTER — Ambulatory Visit: Payer: Federal, State, Local not specified - PPO | Admitting: Physical Therapy

## 2017-02-09 ENCOUNTER — Ambulatory Visit: Payer: Federal, State, Local not specified - PPO | Admitting: Adult Health

## 2017-02-26 ENCOUNTER — Encounter: Payer: Self-pay | Admitting: Adult Health

## 2017-03-04 ENCOUNTER — Other Ambulatory Visit: Payer: Self-pay | Admitting: Adult Health

## 2017-03-04 DIAGNOSIS — Z170 Estrogen receptor positive status [ER+]: Principal | ICD-10-CM

## 2017-03-04 DIAGNOSIS — C50411 Malignant neoplasm of upper-outer quadrant of right female breast: Secondary | ICD-10-CM

## 2017-04-06 ENCOUNTER — Other Ambulatory Visit: Payer: Self-pay

## 2017-04-06 DIAGNOSIS — Z170 Estrogen receptor positive status [ER+]: Principal | ICD-10-CM

## 2017-04-06 DIAGNOSIS — C50411 Malignant neoplasm of upper-outer quadrant of right female breast: Secondary | ICD-10-CM

## 2017-04-06 NOTE — Progress Notes (Signed)
[place]  [phone] [phone]     ID: Holly Best DOB: [birth year]  MR#: [id number]  SKA#:[id number]  Patient Care Team: Manon Hilding, MD as PCP - General (Cardiology) Jovita Kussmaul, MD as Consulting Physician (General Surgery) Magrinat, [name], MD as Consulting Physician (Oncology) Eppie Gibson, MD as Attending Physician (Radiation Oncology) Alden Hipp, MD as Consulting Physician (Obstetrics and Gynecology) Susa Day, MD as Consulting Physician (Orthopedic Surgery) Sandford Craze, MD as Referring Physician (Dermatology) Suella Broad, MD as Consulting Physician (Physical Medicine and Rehabilitation) Virgia Land, MD as Referring Physician (Internal Medicine) OTHER MD:  CHIEF COMPLAINT: Estrogen receptor positive breast cancer  CURRENT TREATMENT: [Anastrozole]   BREAST CANCER HISTORY: From the original intake note:  Holly Best had screening mammography July 2018 showing a possible mass in the upper-outer quadrant of the right breast and calcifications in the lower inner quadrant of the left breast. On 07/17/2016 she underwent bilateral diagnostic mammography with ultrasonography and right breast ultrasonography at the [facility]. Breast density was category C. In the left breast, a group of coarse calcifications in the lower inner quadrant measuring 1.8 cm were felt to be benign.  In the right breast there was a spiculated mass in the upper-outer quadrant which was palpable as a 2.5 cm area of thickening at the 11:00 position of the right breast. There was no palpable right axillary adenopathy. Ultrasonography of the right breast mass confirmed a 0.8 cm hypoechoic and irregular mass at the 10:30-11:00 position 6 cm from the nipple. Ultrasound of the right axilla was sonographically benign.  Biopsy of the right breast mass in question 07/22/2016 showed ([id number]) and invasive ductal carcinoma, grade 1, estrogen  receptor 100% positive, progesterone receptor 90% positive, both with strong staining intensity, with an MIB-1 of 5%, and HER-2 not amplified with a signals ratio of 1.15 and the number per cell 2.25.  The patient's subsequent history is as detailed below.  INTERVAL HISTORY: Holly Best returns today for follow-up and treatment of her estrogen receptor positive breast cancer accompanied by her husband [name] today. She continues on anastrozole, with fairtolerance. She has increased hot flashes but no change vaginal dryness.   Her next mammogram is due in June 2019.    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 She has joint pain. She has a hx of chronic back pain that she is evaluated by Dr. Nelva Bush for, which has since progressed to her upper neck. She has bilateral hands with tingling sensation and bilateral forearm pain. The tingling is worsened at night and it wakes her from her sleep. She has bilateral knee pain. She reports that she stopped the Wellbutrin 2-3 weeks after it was prescribed due to worsening depressed and agitated. She has since felt better, and she still has "moods" but they aren't as frequent. She denies unusual headaches, visual changes, nausea, vomiting, or dizziness. There has been no unusual cough, phlegm production, or pleurisy. This been no change in bowel or bladder habits. She denies unexplained fatigue or unexplained weight loss, bleeding, rash, or fever. A detailed review of systems was otherwise stable.      PAST MEDICAL HISTORY: Past Medical History:  Diagnosis Date  . Cancer [facility])    right breast cancer  . Crohn disease ([place])   . Depression   . Family history of breast cancer   . History of radiation therapy 09/10/16- 10/08/16   Right Breast 2.67 Gy in 15 fractions, Right  Breast boost, 2 Gy in 5 fractions.   . Hypercholesteremia     PAST SURGICAL HISTORY: Past Surgical History:  Procedure Laterality Date  . BREAST LUMPECTOMY WITH  RADIOACTIVE SEED AND SENTINEL LYMPH NODE BIOPSY Right 08/06/2016   Procedure: BREAST LUMPECTOMY WITH RADIOACTIVE SEED AND SENTINEL LYMPH NODE BIOPSY;  Surgeon: Jovita Kussmaul, MD;  Location: [facility];  Service: General;  Laterality: Right;  . COLONOSCOPY    . dental implants     upper  . LUMBAR LAMINECTOMY Bilateral 03/23/2013   Procedure: MICRO LUMBAR DECOMPRESSION, FORAMINOTOMY L4-5;  Surgeon: Johnn Hai, MD;  Location: [facility];  Service: Orthopedics;  Laterality: Bilateral;    FAMILY HISTORY Family History  Problem Relation Age of Onset  . Breast cancer Sister 64  . Breast cancer Other 55  . Lung cancer Mother   . Lung cancer Father   . Breast cancer Maternal Grandmother        dx in her 56s  . Other Brother        non-malignant spinal tumor  . Breast cancer Maternal Aunt        dx > 50  . Lupus Maternal Aunt   . Breast cancer Maternal Aunt        dx >50  The patient's father died from lung cancer at age 54. The patient's mother also died from lung cancer at age 53. The patient has 3 brothers, one sister. The patient's sister was diagnosed with breast cancer at age 38. The patient had a niece with breast cancer (not her sister's daughter) at age 14.   GYNECOLOGIC HISTORY:  No LMP recorded. Patient is postmenopausal. Menarche age 78, first live birth age 32 the patient is [place] P1. She went through the change of life approximately age 30. She did not take hormone replacement. She never used oral contraceptives.  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 Her husband Holly Best") worked in Char[employer] fundraiser. Their son lives in [place] and works for [employer]. The patient has 2 grandchildren. They attend a nondenominational church in [place].     ADVANCED DIRECTIVES: In place   HEALTH MAINTENANCE: Social History   Tobacco Use  . Smoking status: Former Smoker    Packs/day: 1.00    Years: 10.00    Pack years: 10.00    Types:  Cigarettes    Last attempt to quit: 02/17/1978    Years since quitting: 39.1  . Smokeless tobacco: Never Used  Substance Use Topics  . Alcohol use: Yes    Comment: occassionally  . Drug use: No     Colonoscopy: 2018  PAP:  Bone density:   No Known Allergies  Current Outpatient Medications  Medication Sig Dispense Refill  . anastrozole (ARIMIDEX) 1 MG tablet TAKE ONE TABLET BY MOUTH DAILY. 30 tablet 3  . aspirin EC 81 MG tablet Take 81 mg by mouth daily.    Marland Kitchen atorvastatin (LIPITOR) 10 MG tablet Take 5 mg by mouth 3 (three) times a week.    Marland Kitchen buPROPion (WELLBUTRIN XL) 150 MG 24 hr tablet Take 1 tablet (150 mg total) by mouth daily. 90 tablet 4  . calcium carbonate (TUMS - DOSED IN MG ELEMENTAL CALCIUM) 500 MG chewable tablet Chew 500 mg by mouth at bedtime.     Marland Kitchen LORazepam (ATIVAN) 1 MG tablet Take 1 mg by mouth at bedtime.    . naproxen (NAPROSYN) 500 MG tablet Take 500 mg by mouth  at bedtime.     Marland Kitchen oxyCODONE-acetaminophen (PERCOCET/ROXICET) 5-325 MG tablet Take 1 tablet by mouth every 6 (six) hours as needed for severe pain.    Marland Kitchen PARoxetine (PAXIL) 20 MG tablet Take 20 mg by mouth every evening.     No current facility-administered medications for this visit.     OBJECTIVE: Middle-aged white woman in no acute distress  Vitals:   04/07/17 1053  BP: (!) 145/68  Pulse: 78  Resp: 18  Temp: 98.4 F (36.9 C)  SpO2: 97%     Body mass index is 26.52 kg/m.   Wt Readings from Last 3 Encounters:  04/07/17 164 lb 4.8 oz (74.5 kg)  12/09/16 159 lb 11.2 oz (72.4 kg)  11/07/16 157 lb 9.6 oz (71.5 kg)   ECOG FS:1 - Symptomatic but completely ambulatory    Sclerae unicteric, pupils round and equal Oropharynx clear and moist No cervical or supraclavicular adenopathy Lungs no rales or rhonchi Heart regular rate and rhythm Abd soft, nontender, positive bowel sounds MSK no focal spinal tenderness, no upper extremity lymphedema Neuro: nonfocal, well oriented, appropriate  affect Breasts: On the right the patient is status post lumpectomy and radiation.  There is no evidence of local recurrence.  On the left there is no finding of concern.  Both axillae are benign.  LAB RESULTS:  CMP     Component Value Date/Time   NA 142 12/08/2016 0942   K 3.8 12/08/2016 0942   CL 102 03/21/2013 1325   CO2 27 12/08/2016 0942   GLUCOSE 83 12/08/2016 0942   BUN 14.3 12/08/2016 0942   CREATININE 0.9 12/08/2016 0942   CALCIUM 9.9 12/08/2016 0942   PROT 7.3 12/08/2016 0942   ALBUMIN 4.3 12/08/2016 0942   AST 18 12/08/2016 0942   ALT 16 12/08/2016 0942   ALKPHOS 82 12/08/2016 0942   BILITOT 1.02 12/08/2016 0942   GFRNONAA 76 (L) 03/21/2013 1325   GFRAA 88 (L) 03/21/2013 1325    No results found for: TOTALPROTELP, ALBUMINELP, A1GS, A2GS, BETS, BETA2SER, GAMS, MSPIKE, SPEI  No results found for: Nils Pyle, [facility]  Lab Results  Component Value Date   WBC 4.4 04/07/2017   NEUTROABS 3.0 04/07/2017   HGB 12.6 12/08/2016   HCT 36.8 04/07/2017   MCV 96.3 04/07/2017   PLT 310 04/07/2017      Chemistry      Component Value Date/Time   NA 142 12/08/2016 0942   K 3.8 12/08/2016 0942   CL 102 03/21/2013 1325   CO2 27 12/08/2016 0942   BUN 14.3 12/08/2016 0942   CREATININE 0.9 12/08/2016 0942      Component Value Date/Time   CALCIUM 9.9 12/08/2016 0942   ALKPHOS 82 12/08/2016 0942   AST 18 12/08/2016 0942   ALT 16 12/08/2016 0942   BILITOT 1.02 12/08/2016 0942       No results found for: LABCA2  No components found for: ATFTDD220  No results for input(s): INR in the last 168 hours.  Urinalysis No results found for: COLORURINE, APPEARANCEUR, LABSPEC, PHURINE, GLUCOSEU, HGBUR, BILIRUBINUR, KETONESUR, PROTEINUR, UROBILINOGEN, NITRITE, LEUKOCYTESUR   STUDIES: No results found.  ELIGIBLE FOR AVAILABLE RESEARCH PROTOCOL: no  ASSESSMENT: 67 y.o. [place], [place] woman status post right breast upper outer quadrant biopsy 07/22/2016 for a  clinical T1b N0, stage IA invasive ductal carcinoma, grade 1, estrogen and progesterone receptor positive, HER-2 nonamplified, with an MIB-1 of 5%.  (1) Right lumpectomy 08/06/2016 showed a pT1b pN0, stage IA invasive ductal carcinoma, grade  1, with negative margins.    (2) Genetics testing on 08/19/2016 revealed no deleterious mutations APC, ATM, AXIN2, BARD1, BMPR1A, BRCA1, BRCA2, BRIP1, CDH1, CDKN2A (p14ARF), CDKN2A (p16INK4a), CHEK2, CTNNA1, DICER1, EPCAM (Deletion/duplication testing only), GREM1 (promoter region deletion/duplication testing only), KIT, MEN1, MLH1, MSH2, MSH3, MSH6, MUTYH, NBN, NF1, NHTL1, PALB2, PDGFRA, PMS2, POLD1, POLE, PTEN, RAD50, RAD51C, RAD51D, SDHB, SDHC, SDHD, SMAD4, SMARCA4. STK11, TP53, TSC1, TSC2, and VHL.  The following genes were evaluated for sequence changes only: SDHA and HOXB13 c.251G>A variant only.   (3) Oncotype DX: Score of 16 predicts a 10-year risk of recurrence outside the breast of 10% of the patient's only systemic therapy is tamoxifen for 5 years.  It also predicts no benefit from chemotherapy.  (4) adjuvant radiation 09/10/2016-10/08/2016 Site/dose:   1. Right breast, 2.67 Gy in 15 fractions                         2. Boost, 2 Gy in 5 fractions    (5) Anastrozole to start in 11/2016  (a) bone density 11/10/2016 showed a T score of -1.0 (normal)  PLAN: Nautia is now a little over half a year out from definitive surgery for her breast cancer.  She has gotten over most of the complications or side effects of her local treatment.  The question is what to do regarding anastrozole.  She is having "pains all over".  Some of them are likely to be due to carpal tunnel and I have suggested she get a wrist splint to work at night.  However other pains in the shoulders, forearms, and knees may be due to arthritis may be due to fibromyalgia may be due to Lipitor or may be due to anastrozole.  It is really very difficult to tell.  We were going to do at this point  to stop both the anastrozole and the Lipitor.  She will wait 2 weeks and see how she feels.  If she feels considerably better off both medications then she will wait an additional 2 weeks and see how she feels then.  That will be the last Tuesday in April.  If she continues to feel well then we are not dealing with fibromyalgia, which is an up-and-down problem and unlikely to remain on the good symptom side for several weeks at a time.  On the other hand if the symptoms come back off both medications then probably they are due to fibromyalgia not to either medication and we will just have to deal with it.  In any case I have asked her to call us at the end of this month to let us know how she is doing.  At that time we will decide whether to go back to anastrozole or to try a different drug, either tamoxifen or exemestane.  Once she has been on an aromatase inhibitor successfully, perhaps for 6 weeks, we can try bringing back in the Lipitor  She has a good understanding of this plan and I gave it to her in writing  She knows to call for any other issues that may develop before her next visit here, which will be in July, after her next mammogram.  Magrinat, [name], MD  04/07/17 11:10 AM Medical Oncology and Hematology [facility] [place], [place] [address] Tel. [phone]    Fax. [phone]    This document serves as a record of services personally performed by Lurline Del, MD. It was  created on his behalf by Steva Colder, a trained medical scribe. The creation of this record is based on the scribe's personal observations and the provider's statements to them.   I have reviewed the above documentation for accuracy and completeness, and I agree with the above.

## 2017-04-07 ENCOUNTER — Inpatient Hospital Stay: Payer: Federal, State, Local not specified - PPO | Attending: Oncology

## 2017-04-07 ENCOUNTER — Telehealth: Payer: Self-pay | Admitting: Oncology

## 2017-04-07 ENCOUNTER — Inpatient Hospital Stay: Payer: Federal, State, Local not specified - PPO | Admitting: Oncology

## 2017-04-07 VITALS — BP 145/68 | HR 78 | Temp 98.4°F | Resp 18 | Ht 66.0 in | Wt 164.3 lb

## 2017-04-07 DIAGNOSIS — Z170 Estrogen receptor positive status [ER+]: Secondary | ICD-10-CM

## 2017-04-07 DIAGNOSIS — C50411 Malignant neoplasm of upper-outer quadrant of right female breast: Secondary | ICD-10-CM | POA: Diagnosis not present

## 2017-04-07 DIAGNOSIS — Z7982 Long term (current) use of aspirin: Secondary | ICD-10-CM | POA: Insufficient documentation

## 2017-04-07 DIAGNOSIS — Z87891 Personal history of nicotine dependence: Secondary | ICD-10-CM | POA: Diagnosis not present

## 2017-04-07 DIAGNOSIS — F329 Major depressive disorder, single episode, unspecified: Secondary | ICD-10-CM | POA: Diagnosis not present

## 2017-04-07 DIAGNOSIS — K5090 Crohn's disease, unspecified, without complications: Secondary | ICD-10-CM | POA: Insufficient documentation

## 2017-04-07 DIAGNOSIS — Z79899 Other long term (current) drug therapy: Secondary | ICD-10-CM | POA: Insufficient documentation

## 2017-04-07 LAB — CMP (CANCER CENTER ONLY)
ALK PHOS: 81 U/L (ref 40–150)
ALT: 12 U/L (ref 0–55)
ANION GAP: 8 (ref 3–11)
AST: 16 U/L (ref 5–34)
Albumin: 4 g/dL (ref 3.5–5.0)
BILIRUBIN TOTAL: 0.8 mg/dL (ref 0.2–1.2)
BUN: 19 mg/dL (ref 7–26)
CO2: 26 mmol/L (ref 22–29)
Calcium: 9.2 mg/dL (ref 8.4–10.4)
Chloride: 106 mmol/L (ref 98–109)
Creatinine: 0.79 mg/dL (ref 0.60–1.10)
GFR, Est AFR Am: >60 mL/min (ref >60)
Glucose, Bld: 95 mg/dL (ref 70–140)
POTASSIUM: 4.3 mmol/L (ref 3.5–5.1)
Sodium: 140 mmol/L (ref 136–145)
TOTAL PROTEIN: 6.5 g/dL (ref 6.4–8.3)

## 2017-04-07 LAB — CBC WITH DIFFERENTIAL (CANCER CENTER ONLY)
Basophils Absolute: 0 10*3/uL (ref 0.0–0.1)
Basophils Relative: 1 %
Eosinophils Absolute: 0.1 10*3/uL (ref 0.0–0.5)
Eosinophils Relative: 3 %
HEMATOCRIT: 36.8 % (ref 34.8–46.6)
Hemoglobin: 12.3 g/dL (ref 11.6–15.9)
LYMPHS ABS: 0.9 10*3/uL (ref 0.9–3.3)
Lymphocytes Relative: 21 %
MCH: 32.1 pg (ref 25.1–34.0)
MCHC: 33.4 g/dL (ref 31.5–36.0)
MCV: 96.3 fL (ref 79.5–101.0)
MONO ABS: 0.3 10*3/uL (ref 0.1–0.9)
MONOS PCT: 7 %
NEUTROS ABS: 3 10*3/uL (ref 1.5–6.5)
Neutrophils Relative %: 68 %
Platelet Count: 310 10*3/uL (ref 145–400)
RBC: 3.82 MIL/uL (ref 3.70–5.45)
RDW: 13.6 % (ref 11.2–14.5)
WBC Count: 4.4 10*3/uL (ref 3.9–10.3)

## 2017-04-07 NOTE — Telephone Encounter (Signed)
Gave avs and calendar ° °

## 2017-04-08 LAB — VITAMIN D 25 HYDROXY (VIT D DEFICIENCY, FRACTURES): VIT D 25 HYDROXY: 27.6 ng/mL — AB (ref 30.0–100.0)

## 2017-04-09 ENCOUNTER — Telehealth: Payer: Self-pay

## 2017-04-09 NOTE — Telephone Encounter (Signed)
-----   Message from Gardenia Phlegm, NP sent at 04/09/2017  9:39 AM EDT ----- Patient vitamin d is low, is she taking any supplementation? ----- Message ----- From: Buel Ream, Lab In [place] Sent: 04/07/2017  10:56 AM To: Chauncey Cruel, MD

## 2017-04-09 NOTE — Telephone Encounter (Signed)
Spoke with patient regarding low vitamin d results.  NP recommends starting vitamin d 2000 units a day until labs are checked again.  Patient voiced understanding.  Knows to center with questions/concerns.

## 2017-05-07 ENCOUNTER — Telehealth: Payer: Self-pay | Admitting: *Deleted

## 2017-05-07 NOTE — Telephone Encounter (Signed)
Pt left message stating she " is calling to give an update on how I am feeling since I stopped the anastrozole "  Pt did not leave a return call number.  This RN returned call to both home and cell number - no answer on either phone - with VM per cell.  Message left stating returning pt's call, this RN name with request to call again.

## 2017-05-08 ENCOUNTER — Other Ambulatory Visit: Payer: Self-pay | Admitting: *Deleted

## 2017-05-08 NOTE — Telephone Encounter (Signed)
This RN spoke with pt per her call regarding status of joint discomfort post stopping both the anastrazole and lipitor. Bonnie states she has noticed some improvement primarily in her hands,knees and hips.  She will resume the anastrazole and call in 2 weeks with another update.  No other needs at this time.

## 2017-05-28 ENCOUNTER — Other Ambulatory Visit: Payer: Self-pay

## 2017-05-28 DIAGNOSIS — C50411 Malignant neoplasm of upper-outer quadrant of right female breast: Secondary | ICD-10-CM

## 2017-05-28 DIAGNOSIS — Z170 Estrogen receptor positive status [ER+]: Principal | ICD-10-CM

## 2017-06-16 ENCOUNTER — Telehealth: Payer: Self-pay | Admitting: *Deleted

## 2017-06-16 ENCOUNTER — Other Ambulatory Visit: Payer: Self-pay | Admitting: Oncology

## 2017-06-16 DIAGNOSIS — N950 Postmenopausal bleeding: Secondary | ICD-10-CM | POA: Insufficient documentation

## 2017-06-16 DIAGNOSIS — C50411 Malignant neoplasm of upper-outer quadrant of right female breast: Secondary | ICD-10-CM

## 2017-06-16 DIAGNOSIS — Z170 Estrogen receptor positive status [ER+]: Principal | ICD-10-CM

## 2017-06-16 NOTE — Telephone Encounter (Signed)
This RN spoke with pt per her call stating onset of vaginal spotting 06/13/2017 "very light spotting, bright red "  Spotting has now resolved.  She denies spotting associated with any sexual activity.  She denies any discomfort associated with the vaginal spotting.  Last GYN exam was July 2019 " and Dr Harrington Challenger said I didn't need any further PAP exams "  Of note Holly Best had temporarily stopped anastrozole post MD visit in April due to joint pain - but resumed post pain not better off the medication. She has been back on the anastrazole for 3 weeks prior to onset of vaginal spotting.  Pt is currently scheduled for follow up with Dr Jana Hakim 07/20/2017.  This note will be given to MD for further follow up.

## 2017-06-16 NOTE — Progress Notes (Signed)
Check he had some postmenopausal bleeding, which has resolved, but needs evaluation.  I called her and discussed.  I am putting in a transvaginal ultrasound for her in any pain hospital.  She will let me know if they have not called her to schedule within 2 days.

## 2017-06-23 ENCOUNTER — Other Ambulatory Visit: Payer: Self-pay

## 2017-06-23 ENCOUNTER — Other Ambulatory Visit: Payer: Self-pay | Admitting: Oncology

## 2017-06-23 DIAGNOSIS — C50411 Malignant neoplasm of upper-outer quadrant of right female breast: Secondary | ICD-10-CM

## 2017-06-23 DIAGNOSIS — Z170 Estrogen receptor positive status [ER+]: Principal | ICD-10-CM

## 2017-06-26 ENCOUNTER — Ambulatory Visit (HOSPITAL_COMMUNITY)
Admission: RE | Admit: 2017-06-26 | Discharge: 2017-06-26 | Disposition: A | Discharge status: Home / Self Care | Payer: Federal, State, Local not specified - PPO | Source: Ambulatory Visit | Attending: Oncology | Admitting: Oncology

## 2017-06-26 DIAGNOSIS — Z170 Estrogen receptor positive status [ER+]: Secondary | ICD-10-CM | POA: Diagnosis present

## 2017-06-26 DIAGNOSIS — N939 Abnormal uterine and vaginal bleeding, unspecified: Secondary | ICD-10-CM | POA: Diagnosis present

## 2017-06-26 DIAGNOSIS — C50411 Malignant neoplasm of upper-outer quadrant of right female breast: Secondary | ICD-10-CM | POA: Diagnosis not present

## 2017-06-30 ENCOUNTER — Telehealth: Payer: Self-pay

## 2017-06-30 NOTE — Telephone Encounter (Signed)
Called and lvm with pt to let her know that her Ultrasound results came back normal. Follow up appt confirmed for 07/20/17. Call back number provided to have pt call and confirm receipt of message and if there are any additional questions.

## 2017-07-17 ENCOUNTER — Other Ambulatory Visit: Payer: Self-pay

## 2017-07-17 DIAGNOSIS — Z803 Family history of malignant neoplasm of breast: Secondary | ICD-10-CM

## 2017-07-19 NOTE — Progress Notes (Signed)
[place]  [phone] [phone]     ID: Holly Best DOB: 1950/12/22  MR#: [id number]  MCN#:[id number]  Patient Care Team: Manon Hilding, MD as PCP - General (Cardiology) Jovita Kussmaul, MD as Consulting Physician (General Surgery) Soren Pigman, [name], MD as Consulting Physician (Oncology) Eppie Gibson, MD as Attending Physician (Radiation Oncology) Alden Hipp, MD as Consulting Physician (Obstetrics and Gynecology) Susa Day, MD as Consulting Physician (Orthopedic Surgery) Sandford Craze, MD as Referring Physician (Dermatology) Suella Broad, MD as Consulting Physician (Physical Medicine and Rehabilitation) Virgia Land, MD as Referring Physician (Internal Medicine) Eustace Moore, MD as Consulting Physician (Neurosurgery) OTHER MD:  CHIEF COMPLAINT: Estrogen receptor positive breast cancer  CURRENT TREATMENT: Anastrozole   BREAST CANCER HISTORY: From the original intake note:  Holly Best had screening mammography July 2018 showing a possible mass in the upper-outer quadrant of the right breast and calcifications in the lower inner quadrant of the left breast. On 07/17/2016 she underwent bilateral diagnostic mammography with ultrasonography and right breast ultrasonography at the [facility]. Breast density was category C. In the left breast, a group of coarse calcifications in the lower inner quadrant measuring 1.8 cm were felt to be benign.  In the right breast there was a spiculated mass in the upper-outer quadrant which was palpable as a 2.5 cm area of thickening at the 11:00 position of the right breast. There was no palpable right axillary adenopathy. Ultrasonography of the right breast mass confirmed a 0.8 cm hypoechoic and irregular mass at the 10:30-11:00 position 6 cm from the nipple. Ultrasound of the right axilla was sonographically benign.  Biopsy of the right breast mass in question 07/22/2016 showed ([id number])  and invasive ductal carcinoma, grade 1, estrogen receptor 100% positive, progesterone receptor 90% positive, both with strong staining intensity, with an MIB-1 of 5%, and HER-2 not amplified with a signals ratio of 1.15 and the number per cell 2.25.  The patient's subsequent history is as detailed below.  INTERVAL HISTORY: Holly Best returns today for follow-up and treatment of her estrogen receptor positive breast cancer accompanied by her husband. She stopped her anastrozole for approximately 3 weeks, but it really made no difference in the way she was feeling and therefore she resumed it.   She notes that she also stopped taking Lipitor also due to bone pain.  When she resumed the Lipitor the bone pain came back and therefore she has gone off that medication at present  Since her last visit, she completed a transvaginal ultrasound of the pelvis on 06/26/2017 due to vaginal spotting, with results showing: Nonvisualization of the left ovary. The endometrial thickness was 4 mm.  No other focal abnormality is noted. She denies having any other vaginal spotting or bleeding. She notes that she was supposed to follow up with Dr. Harrington Challenger about a week ago, but she did not want to have a PAP smear.  She also completed diagnostic bilateral mammography with CAD and tomography on 07/20/2017 at [facility] showing: breast density category C. There was no evidence of malignancy.   Also at the last visit her vitamin D level was low at 27.6. She started taking 2,000 units of Vitamins D daily.    REVIEW OF SYSTEMS: Holly Best reports that she continues to have significant back pain, and she takes percocet and receives cortisone shots under Dr. Nelva Bush. She also plans to see neurosurgeon Dr. Sherley Bounds. She denies unusual headaches, visual changes, nausea, vomiting, or dizziness. There  has been no unusual cough, phlegm production, or pleurisy. This been no change in bowel or bladder habits. She denies unexplained  fatigue or unexplained weight loss, bleeding, rash, or fever. A detailed review of systems was otherwise stable.   PAST MEDICAL HISTORY: Past Medical History:  Diagnosis Date  . Cancer [facility])    right breast cancer  . Crohn disease ([place])   . Depression   . Family history of breast cancer   . History of radiation therapy 09/10/16- 10/08/16   Right Breast 2.67 Gy in 15 fractions, Right Breast boost, 2 Gy in 5 fractions.   . Hypercholesteremia     PAST SURGICAL HISTORY: Past Surgical History:  Procedure Laterality Date  . BREAST LUMPECTOMY WITH RADIOACTIVE SEED AND SENTINEL LYMPH NODE BIOPSY Right 08/06/2016   Procedure: BREAST LUMPECTOMY WITH RADIOACTIVE SEED AND SENTINEL LYMPH NODE BIOPSY;  Surgeon: Jovita Kussmaul, MD;  Location: [facility];  Service: General;  Laterality: Right;  . COLONOSCOPY    . dental implants     upper  . LUMBAR LAMINECTOMY Bilateral 03/23/2013   Procedure: MICRO LUMBAR DECOMPRESSION, FORAMINOTOMY L4-5;  Surgeon: Johnn Hai, MD;  Location: [facility];  Service: Orthopedics;  Laterality: Bilateral;    FAMILY HISTORY Family History  Problem Relation Age of Onset  . Breast cancer Sister 18  . Breast cancer Other 55  . Lung cancer Mother   . Lung cancer Father   . Breast cancer Maternal Grandmother        dx in her 61s  . Other Brother        non-malignant spinal tumor  . Breast cancer Maternal Aunt        dx > 50  . Lupus Maternal Aunt   . Breast cancer Maternal Aunt        dx >50  The patient's father died from lung cancer at age 36. The patient's mother also died from lung cancer at age 72. The patient has 3 brothers, one sister. The patient's sister was diagnosed with breast cancer at age 58. The patient had a niece with breast cancer (not her sister's daughter) at age 40.   GYNECOLOGIC HISTORY:  No LMP recorded. Patient is postmenopausal. Menarche age 89, first live birth age 45 the patient is Holly Best. She went through the change of life approximately age 30.  She did not take hormone replacement. She never used oral contraceptives.  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 Her husband [name]") worked in [employer] fundraiser. Their son lives in [place] and works for [employer]. The patient has 2 grandchildren. They attend a nondenominational church in [place].     ADVANCED DIRECTIVES: In place   HEALTH MAINTENANCE: Social History   Tobacco Use  . Smoking status: Former Smoker    Packs/day: 1.00    Years: 10.00    Pack years: 10.00    Types: Cigarettes    Last attempt to quit: 02/17/1978    Years since quitting: 39.4  . Smokeless tobacco: Never Used  Substance Use Topics  . Alcohol use: Yes    Comment: occassionally  . Drug use: No     Colonoscopy: 2018  PAP:  Bone density:   No Known Allergies  Current Outpatient Medications  Medication Sig Dispense Refill  . anastrozole (ARIMIDEX) 1 MG tablet TAKE ONE TABLET BY MOUTH DAILY. 30 tablet 3  . aspirin EC 81 MG tablet Take 81 mg by mouth daily.    Marland Kitchen  atorvastatin (LIPITOR) 10 MG tablet Take 5 mg by mouth 3 (three) times a week.    . calcium carbonate (TUMS - DOSED IN MG ELEMENTAL CALCIUM) 500 MG chewable tablet Chew 500 mg by mouth at bedtime.     Marland Kitchen LORazepam (ATIVAN) 1 MG tablet Take 1 mg by mouth at bedtime.    . naproxen (NAPROSYN) 500 MG tablet Take 500 mg by mouth at bedtime.     Marland Kitchen oxyCODONE-acetaminophen (PERCOCET/ROXICET) 5-325 MG tablet Take 1 tablet by mouth every 6 (six) hours as needed for severe pain.    Marland Kitchen PARoxetine (PAXIL) 20 MG tablet Take 20 mg by mouth every evening.     No current facility-administered medications for this visit.     OBJECTIVE: Middle-aged white Best who appears stated age  67:   07/20/17 1109  BP: 123/83  Pulse: 66  Resp: 16  Temp: 98.4 F (36.9 C)  SpO2: 98%     Body mass index is 26.15 kg/m.   Wt Readings from Last 3 Encounters:  07/20/17 162 lb (73.5 kg)  04/07/17 164 lb  4.8 oz (74.5 kg)  12/09/16 159 lb 11.2 oz (72.4 kg)   ECOG FS:2 - Symptomatic, <50% confined to bed    Sclerae unicteric, EOMs intact Oropharynx clear and moist No cervical or supraclavicular adenopathy Lungs no rales or rhonchi Heart regular rate and rhythm Abd soft, nontender, positive bowel sounds MSK no focal spinal tenderness, no upper extremity lymphedema Neuro: nonfocal, well oriented, appropriate affect Breasts: Status post right breast lumpectomy and radiation.  The cosmetic result is excellent.  There is no evidence of local recurrence.  There is some sensitivity because she had her mammogram today.  Left breast is benign.  Both axillae are benign.  LAB RESULTS:  CMP     Component Value Date/Time   NA 140 04/07/2017 1036   NA 142 12/08/2016 0942   K 4.3 04/07/2017 1036   K 3.8 12/08/2016 0942   CL 106 04/07/2017 1036   CO2 26 04/07/2017 1036   CO2 27 12/08/2016 0942   GLUCOSE 95 04/07/2017 1036   GLUCOSE 83 12/08/2016 0942   BUN 19 04/07/2017 1036   BUN 14.3 12/08/2016 0942   CREATININE 0.79 04/07/2017 1036   CREATININE 0.9 12/08/2016 0942   CALCIUM 9.2 04/07/2017 1036   CALCIUM 9.9 12/08/2016 0942   PROT 6.5 04/07/2017 1036   PROT 7.3 12/08/2016 0942   ALBUMIN 4.0 04/07/2017 1036   ALBUMIN 4.3 12/08/2016 0942   AST 16 04/07/2017 1036   AST 18 12/08/2016 0942   ALT 12 04/07/2017 1036   ALT 16 12/08/2016 0942   ALKPHOS 81 04/07/2017 1036   ALKPHOS 82 12/08/2016 0942   BILITOT 0.8 04/07/2017 1036   BILITOT 1.02 12/08/2016 0942   GFRNONAA >60 04/07/2017 1036   GFRAA >60 04/07/2017 1036    No results found for: TOTALPROTELP, ALBUMINELP, A1GS, A2GS, BETS, BETA2SER, GAMS, MSPIKE, SPEI  No results found for: KPAFRELGTCHN, LAMBDASER, [facility]  Lab Results  Component Value Date   WBC 4.4 04/07/2017   NEUTROABS 3.0 04/07/2017   HGB 12.3 04/07/2017   HCT 36.8 04/07/2017   MCV 96.3 04/07/2017   PLT 310 04/07/2017      Chemistry      Component  Value Date/Time   NA 140 04/07/2017 1036   NA 142 12/08/2016 0942   K 4.3 04/07/2017 1036   K 3.8 12/08/2016 0942   CL 106 04/07/2017 1036   CO2 26 04/07/2017 1036  CO2 27 12/08/2016 0942   BUN 19 04/07/2017 1036   BUN 14.3 12/08/2016 0942   CREATININE 0.79 04/07/2017 1036   CREATININE 0.9 12/08/2016 0942      Component Value Date/Time   CALCIUM 9.2 04/07/2017 1036   CALCIUM 9.9 12/08/2016 0942   ALKPHOS 81 04/07/2017 1036   ALKPHOS 82 12/08/2016 0942   AST 16 04/07/2017 1036   AST 18 12/08/2016 0942   ALT 12 04/07/2017 1036   ALT 16 12/08/2016 0942   BILITOT 0.8 04/07/2017 1036   BILITOT 1.02 12/08/2016 0942       No results found for: LABCA2  No components found for: [id number]  No results for input(s): INR in the last 168 hours.  Urinalysis No results found for: COLORURINE, APPEARANCEUR, LABSPEC, PHURINE, GLUCOSEU, HGBUR, BILIRUBINUR, KETONESUR, PROTEINUR, UROBILINOGEN, NITRITE, LEUKOCYTESUR   STUDIES: Mm Diag Breast Tomo Bilateral  Result Date: 07/20/2017 CLINICAL DATA:  67 year old female presenting for routine annual surveillance status post right breast lumpectomy in 2018. EXAM: DIGITAL DIAGNOSTIC BILATERAL MAMMOGRAM WITH CAD AND TOMO COMPARISON:  Previous exam(s). ACR Breast Density Category c: The breast tissue is heterogeneously dense, which may obscure small masses. FINDINGS: Expected surgical changes noted in the upper-outer quadrant of the right breast consistent with history of lumpectomy. No suspicious calcifications, masses or areas of distortion are seen in the bilateral breasts. Mammographic images were processed with CAD. IMPRESSION: Expected surgical changes in the upper-outer right breast consistent with lumpectomy. No mammographic evidence of malignancy in the bilateral breasts. RECOMMENDATION: Diagnostic mammogram is suggested in 1 year. (Code:[id number]-01Y) I have discussed the findings and recommendations with the patient. Results were also provided in  writing at the conclusion of the visit. If applicable, a reminder letter will be sent to the patient regarding the next appointment. BI-RADS CATEGORY  2: Benign. Electronically Signed   By: Ammie Ferrier M.D.   On: 07/20/2017 08:40   US Pelvic Complete With Transvaginal  Result Date: 06/26/2017 CLINICAL DATA:  Spotting with history of breast cancer EXAM: TRANSABDOMINAL AND TRANSVAGINAL ULTRASOUND OF PELVIS TECHNIQUE: Both transabdominal and transvaginal ultrasound examinations of the pelvis were performed. Transabdominal technique was performed for global imaging of the pelvis including uterus, ovaries, adnexal regions, and pelvic cul-de-sac. It was necessary to proceed with endovaginal exam following the transabdominal exam to visualize the ovaries. COMPARISON:  None FINDINGS: Uterus Measurements: 5.7 x 2.5 x 3.3 cm. No fibroids or other mass visualized. Endometrium Thickness: 4 mm.  No focal abnormality visualized. Right ovary Measurements: 1.6 x 1.1 x 1.5 cm. Normal appearance/no adnexal mass. Left ovary Not well visualized Other findings No abnormal free fluid. IMPRESSION: Nonvisualization of the left ovary. No other focal abnormality is noted. Electronically Signed   By: Inez Catalina M.D.   On: 06/26/2017 13:45    ELIGIBLE FOR AVAILABLE RESEARCH PROTOCOL: no  ASSESSMENT: 67 y.o. Holly Best, Holly Best status post right breast upper outer quadrant biopsy 07/22/2016 for a clinical T1b N0, stage IA invasive ductal carcinoma, grade 1, estrogen and progesterone receptor positive, HER-2 nonamplified, with an MIB-1 of 5%.  (1) Right lumpectomy 08/06/2016 showed a pT1b pN0, stage IA invasive ductal carcinoma, grade 1, with negative margins.    (2) Genetics testing on 08/19/2016 revealed no deleterious mutations APC, ATM, AXIN2, BARD1, BMPR1A, BRCA1, BRCA2, BRIP1, CDH1, CDKN2A (p14ARF), CDKN2A (p16INK4a), CHEK2, CTNNA1, DICER1, EPCAM (Deletion/duplication testing only), GREM1 (promoter region deletion/duplication  testing only), KIT, MEN1, MLH1, MSH2, MSH3, MSH6, MUTYH, NBN, NF1, NHTL1, PALB2, PDGFRA, PMS2, POLD1, POLE, PTEN, RAD50, RAD51C,  RAD51D, SDHB, SDHC, SDHD, SMAD4, SMARCA4. STK11, TP53, TSC1, TSC2, and VHL.  The following genes were evaluated for sequence changes only: SDHA and HOXB13 c.251G>A variant only.   (3) Oncotype DX: Score of 16 predicts a 10-year risk of recurrence outside the breast of 10% of the patient's only systemic therapy is tamoxifen for 5 years.  It also predicts no benefit from chemotherapy.  (4) adjuvant radiation 09/10/2016-10/08/2016 Site/dose:   1. Right breast, 2.67 Gy in 15 fractions                         2. Boost, 2 Gy in 5 fractions   (5) Anastrozole started 11/2016  (a) bone density 11/10/2016 showed a T score of -1.0 (normal)  PLAN: Holly Best is now just about a year out from definitive surgery for her breast cancer with no evidence of disease recurrence.  This is favorable.  She is tolerating anastrozole well and the plan will be to continue that a total of 5 years.  She had a normal bone density last November.  We will repeat that November 2020.  She is now replacing her vitamin D on a daily basis.  Her biggest problem is her low back pain.  She is going to be meeting with Dr. Ronnald Ramp and is thinking that she is going to need to undergo surgery.  She will see Dr. Marlou Starks in about 3 months.  She will see me again early next year.  She knows to call for any problems that may develop before that visit.   Dequandre Cordova, [name], MD  07/20/17 11:34 AM Medical Oncology and Hematology [facility] [address] [place], [place] [address] Tel. [phone]    Fax. [phone]  Alice Rieger, am acting as scribe for Chauncey Cruel MD.  I, Lurline Del MD, have reviewed the above documentation for accuracy and completeness, and I agree with the above.

## 2017-07-20 ENCOUNTER — Inpatient Hospital Stay: Payer: Federal, State, Local not specified - PPO

## 2017-07-20 ENCOUNTER — Ambulatory Visit
Admission: RE | Admit: 2017-07-20 | Discharge: 2017-07-20 | Disposition: A | Discharge status: Home / Self Care | Payer: Federal, State, Local not specified - PPO | Source: Ambulatory Visit | Attending: Oncology | Admitting: Oncology

## 2017-07-20 ENCOUNTER — Inpatient Hospital Stay: Payer: Federal, State, Local not specified - PPO | Attending: Oncology | Admitting: Oncology

## 2017-07-20 VITALS — BP 123/83 | HR 66 | Temp 98.4°F | Resp 16 | Ht 66.0 in | Wt 162.0 lb

## 2017-07-20 DIAGNOSIS — Z79899 Other long term (current) drug therapy: Secondary | ICD-10-CM

## 2017-07-20 DIAGNOSIS — Z803 Family history of malignant neoplasm of breast: Secondary | ICD-10-CM

## 2017-07-20 DIAGNOSIS — C50411 Malignant neoplasm of upper-outer quadrant of right female breast: Secondary | ICD-10-CM | POA: Insufficient documentation

## 2017-07-20 DIAGNOSIS — Z170 Estrogen receptor positive status [ER+]: Secondary | ICD-10-CM | POA: Diagnosis not present

## 2017-07-20 DIAGNOSIS — M545 Low back pain: Secondary | ICD-10-CM | POA: Diagnosis not present

## 2017-07-20 LAB — CMP (CANCER CENTER ONLY)
ALBUMIN: 3.9 g/dL (ref 3.5–5.0)
ALT: 15 U/L (ref 0–44)
AST: 17 U/L (ref 15–41)
Alkaline Phosphatase: 79 U/L (ref 38–126)
Anion gap: 7 (ref 5–15)
BUN: 13 mg/dL (ref 8–23)
CHLORIDE: 108 mmol/L (ref 98–111)
CO2: 28 mmol/L (ref 22–32)
CREATININE: 0.82 mg/dL (ref 0.44–1.00)
Calcium: 9.2 mg/dL (ref 8.9–10.3)
GFR, Est AFR Am: >60 mL/min (ref >60)
GFR, Estimated: >60 mL/min (ref >60)
GLUCOSE: 81 mg/dL (ref 70–99)
POTASSIUM: 4.3 mmol/L (ref 3.5–5.1)
SODIUM: 143 mmol/L (ref 135–145)
Total Bilirubin: 0.9 mg/dL (ref 0.3–1.2)
Total Protein: 6.5 g/dL (ref 6.5–8.1)

## 2017-07-20 LAB — CBC WITH DIFFERENTIAL (CANCER CENTER ONLY)
Basophils Absolute: 0 10*3/uL (ref 0.0–0.1)
Basophils Relative: 1 %
EOS ABS: 0.2 10*3/uL (ref 0.0–0.5)
EOS PCT: 4 %
HCT: 35.8 % (ref 34.8–46.6)
Hemoglobin: 11.8 g/dL (ref 11.6–15.9)
LYMPHS ABS: 1.3 10*3/uL (ref 0.9–3.3)
LYMPHS PCT: 27 %
MCH: 32.2 pg (ref 25.1–34.0)
MCHC: 33 g/dL (ref 31.5–36.0)
MCV: 97.5 fL (ref 79.5–101.0)
MONOS PCT: 7 %
Monocytes Absolute: 0.3 10*3/uL (ref 0.1–0.9)
Neutro Abs: 3 10*3/uL (ref 1.5–6.5)
Neutrophils Relative %: 61 %
PLATELETS: 283 10*3/uL (ref 145–400)
RBC: 3.67 MIL/uL — ABNORMAL LOW (ref 3.70–5.45)
RDW: 12.6 % (ref 11.2–14.5)
WBC Count: 4.9 10*3/uL (ref 3.9–10.3)

## 2017-07-20 MED ORDER — ANASTROZOLE 1 MG PO TABS: 1.0000 mg | ORAL_TABLET | Freq: Every day | ORAL | 3 refills | Status: DC

## 2017-10-13 ENCOUNTER — Other Ambulatory Visit: Payer: Self-pay | Admitting: Neurological Surgery

## 2017-10-13 NOTE — Pre-Procedure Instructions (Signed)
PANDORA MCCRACKIN  10/13/2017      [facility] [address], [place], [place] - [place] [address] Phone: [phone] Fax: [phone]    Your procedure is scheduled on Friday October 11.  Report to [facility] at 8:00 A.M.  Call this number if you have problems the morning of surgery:  [phone]   Remember:  Do not eat or drink after midnight.    Take these medicines the morning of surgery with A SIP OF WATER:   Anastrozole (arimidex) Percocet if needed  7 days prior to surgery STOP taking any Aspirin(unless otherwise instructed by your surgeon), Aleve, Naproxen, Ibuprofen, Motrin, Advil, Goody's, BC's, all herbal medications, fish oil, and all vitamins     Do not wear jewelry, make-up or nail polish.  Do not wear lotions, powders, or perfumes, or deodorant.  Do not shave 48 hours prior to surgery.  Men may shave face and neck.  Do not bring valuables to the hospital.  [facility] is not responsible for any belongings or valuables.  Contacts, dentures or bridgework may not be worn into surgery.  Leave your suitcase in the car.  After surgery it may be brought to your room.  For patients admitted to the hospital, discharge time will be determined by your treatment team.  Patients discharged the day of surgery will not be allowed to drive home.   Special instructions:    [facility]- Preparing For Surgery  Before surgery, you can play an important role. Because skin is not sterile, your skin needs to be as free of germs as possible. You can reduce the number of germs on your skin by washing with CHG (chlorahexidine gluconate) Soap before surgery.  CHG is an antiseptic cleaner which kills germs and bonds with the skin to continue killing germs even after washing.    Oral Hygiene is also important to reduce your risk of infection.  Remember - BRUSH YOUR TEETH THE MORNING OF SURGERY WITH YOUR REGULAR  TOOTHPASTE  Please do not use if you have an allergy to CHG or antibacterial soaps. If your skin becomes reddened/irritated stop using the CHG.  Do not shave (including legs and underarms) for at least 48 hours prior to first CHG shower. It is OK to shave your face.  Please follow these instructions carefully.   1. Shower the NIGHT BEFORE SURGERY and the MORNING OF SURGERY with CHG.   2. If you chose to wash your hair, wash your hair first as usual with your normal shampoo.  3. After you shampoo, rinse your hair and body thoroughly to remove the shampoo.  4. Use CHG as you would any other liquid soap. You can apply CHG directly to the skin and wash gently with a scrungie or a clean washcloth.   5. Apply the CHG Soap to your body ONLY FROM THE NECK DOWN.  Do not use on open wounds or open sores. Avoid contact with your eyes, ears, mouth and genitals (private parts). Wash Face and genitals (private parts)  with your normal soap.  6. Wash thoroughly, paying special attention to the area where your surgery will be performed.  7. Thoroughly rinse your body with warm water from the neck down.  8. DO NOT shower/wash with your normal soap after using and rinsing off the CHG Soap.  9. Pat yourself dry with a CLEAN TOWEL.  10. Wear CLEAN PAJAMAS to bed the night  before surgery, wear comfortable clothes the morning of surgery  11. Place CLEAN SHEETS on your bed the night of your first shower and DO NOT SLEEP WITH PETS.    Day of Surgery:  Do not apply any deodorants/lotions.  Please wear clean clothes to the hospital/surgery center.   Remember to brush your teeth WITH YOUR REGULAR TOOTHPASTE.    Please read over the following fact sheets that you were given. Coughing and Deep Breathing, MRSA Information and Surgical Site Infection Prevention

## 2017-10-15 ENCOUNTER — Ambulatory Visit (HOSPITAL_COMMUNITY)
Admission: RE | Admit: 2017-10-15 | Discharge: 2017-10-15 | Disposition: A | Discharge status: Home / Self Care | Payer: Medicare Other | Source: Ambulatory Visit | Attending: Neurological Surgery | Admitting: Neurological Surgery

## 2017-10-15 ENCOUNTER — Encounter (HOSPITAL_COMMUNITY)
Admission: RE | Admit: 2017-10-15 | Discharge: 2017-10-15 | Disposition: A | Discharge status: Home / Self Care | Payer: Medicare Other | Source: Ambulatory Visit | Attending: Neurological Surgery | Admitting: Neurological Surgery

## 2017-10-15 ENCOUNTER — Other Ambulatory Visit: Payer: Self-pay

## 2017-10-15 ENCOUNTER — Encounter (HOSPITAL_COMMUNITY): Payer: Self-pay

## 2017-10-15 DIAGNOSIS — M4310 Spondylolisthesis, site unspecified: Secondary | ICD-10-CM

## 2017-10-15 LAB — BASIC METABOLIC PANEL
Anion gap: 9 (ref 5–15)
BUN: 14 mg/dL (ref 8–23)
CO2: 26 mmol/L (ref 22–32)
CREATININE: 0.81 mg/dL (ref 0.44–1.00)
Calcium: 9.3 mg/dL (ref 8.9–10.3)
Chloride: 103 mmol/L (ref 98–111)
Glucose, Bld: 91 mg/dL (ref 70–99)
POTASSIUM: 4.1 mmol/L (ref 3.5–5.1)
SODIUM: 138 mmol/L (ref 135–145)

## 2017-10-15 LAB — CBC WITH DIFFERENTIAL/PLATELET
Abs Immature Granulocytes: 0.01 10*3/uL (ref 0.00–0.07)
BASOS ABS: 0.1 10*3/uL (ref 0.0–0.1)
BASOS PCT: 1 %
EOS ABS: 0.2 10*3/uL (ref 0.0–0.5)
EOS PCT: 3 %
HCT: 41.6 % (ref 36.0–46.0)
Hemoglobin: 13 g/dL (ref 12.0–15.0)
Immature Granulocytes: 0 %
LYMPHS PCT: 24 %
Lymphs Abs: 1.4 10*3/uL (ref 0.7–4.0)
MCH: 31.3 pg (ref 26.0–34.0)
MCHC: 31.3 g/dL (ref 30.0–36.0)
MCV: 100.2 fL — ABNORMAL HIGH (ref 80.0–100.0)
MONO ABS: 0.4 10*3/uL (ref 0.1–1.0)
Monocytes Relative: 7 %
NRBC: 0 % (ref 0.0–0.2)
Neutro Abs: 3.8 10*3/uL (ref 1.7–7.7)
Neutrophils Relative %: 65 %
PLATELETS: 375 10*3/uL (ref 150–400)
RBC: 4.15 MIL/uL (ref 3.87–5.11)
RDW: 11.9 % (ref 11.5–15.5)
WBC: 5.8 10*3/uL (ref 4.0–10.5)

## 2017-10-15 LAB — TYPE AND SCREEN
ABO/RH(D): A POS
Antibody Screen: NEGATIVE

## 2017-10-15 LAB — ABO/RH: ABO/RH(D): A POS

## 2017-10-15 LAB — SURGICAL PCR SCREEN
MRSA, PCR: NEGATIVE
STAPHYLOCOCCUS AUREUS: NEGATIVE

## 2017-10-15 LAB — PROTIME-INR
INR: 0.97
Prothrombin Time: 12.8 seconds (ref 11.4–15.2)

## 2017-10-15 NOTE — Progress Notes (Signed)
PCP - Consuello Masse Cardiologist - Dr Debara Pickett- pt has not seen yet, pt reports no cardiac issues, but is seeing Dr. Debara Pickett on 10/20/17 just to do a routine workup. (her husband sees Dr. Debara Pickett)  Chest x-ray - 10/15/2017  EKG - 10/15/2017   Aspirin Instructions: pt has already stopped ASA   Patient denies shortness of breath, fever, cough and chest pain at PAT appointment   Patient verbalized understanding of instructions that were given to them at the PAT appointment. Patient was also instructed that they will need to review over the PAT instructions again at home before surgery.

## 2017-10-16 ENCOUNTER — Inpatient Hospital Stay (HOSPITAL_COMMUNITY): Payer: Medicare Other

## 2017-10-16 ENCOUNTER — Encounter (HOSPITAL_COMMUNITY): Payer: Self-pay | Admitting: Neurological Surgery

## 2017-10-16 ENCOUNTER — Inpatient Hospital Stay (HOSPITAL_COMMUNITY)
Admission: RE | Disposition: A | Discharge status: Home / Self Care | Payer: Self-pay | Source: Home / Self Care | Attending: Neurological Surgery

## 2017-10-16 ENCOUNTER — Inpatient Hospital Stay (HOSPITAL_COMMUNITY): Payer: Medicare Other | Admitting: Anesthesiology

## 2017-10-16 ENCOUNTER — Inpatient Hospital Stay (HOSPITAL_COMMUNITY)
Admission: RE | Admit: 2017-10-16 | Discharge: 2017-10-17 | DRG: 454 | Disposition: A | Discharge status: Home / Self Care | Payer: Medicare Other | Attending: Neurosurgery | Admitting: Neurosurgery

## 2017-10-16 DIAGNOSIS — Z7982 Long term (current) use of aspirin: Secondary | ICD-10-CM

## 2017-10-16 DIAGNOSIS — Z79899 Other long term (current) drug therapy: Secondary | ICD-10-CM

## 2017-10-16 DIAGNOSIS — Z803 Family history of malignant neoplasm of breast: Secondary | ICD-10-CM

## 2017-10-16 DIAGNOSIS — Z87891 Personal history of nicotine dependence: Secondary | ICD-10-CM | POA: Diagnosis not present

## 2017-10-16 DIAGNOSIS — M4316 Spondylolisthesis, lumbar region: Secondary | ICD-10-CM | POA: Diagnosis present

## 2017-10-16 DIAGNOSIS — Z79811 Long term (current) use of aromatase inhibitors: Secondary | ICD-10-CM | POA: Diagnosis not present

## 2017-10-16 DIAGNOSIS — Z23 Encounter for immunization: Secondary | ICD-10-CM | POA: Diagnosis present

## 2017-10-16 DIAGNOSIS — Z923 Personal history of irradiation: Secondary | ICD-10-CM | POA: Diagnosis not present

## 2017-10-16 DIAGNOSIS — Z853 Personal history of malignant neoplasm of breast: Secondary | ICD-10-CM | POA: Diagnosis not present

## 2017-10-16 DIAGNOSIS — K5090 Crohn's disease, unspecified, without complications: Secondary | ICD-10-CM | POA: Diagnosis present

## 2017-10-16 DIAGNOSIS — E7800 Pure hypercholesterolemia, unspecified: Secondary | ICD-10-CM | POA: Diagnosis present

## 2017-10-16 DIAGNOSIS — M48061 Spinal stenosis, lumbar region without neurogenic claudication: Principal | ICD-10-CM | POA: Diagnosis present

## 2017-10-16 DIAGNOSIS — M5126 Other intervertebral disc displacement, lumbar region: Secondary | ICD-10-CM | POA: Diagnosis present

## 2017-10-16 DIAGNOSIS — Z981 Arthrodesis status: Secondary | ICD-10-CM

## 2017-10-16 DIAGNOSIS — M545 Low back pain: Secondary | ICD-10-CM | POA: Diagnosis present

## 2017-10-16 DIAGNOSIS — Z791 Long term (current) use of non-steroidal anti-inflammatories (NSAID): Secondary | ICD-10-CM

## 2017-10-16 DIAGNOSIS — Z419 Encounter for procedure for purposes other than remedying health state, unspecified: Secondary | ICD-10-CM

## 2017-10-16 HISTORY — PX: POSTERIOR LUMBAR FUSION: SHX6036

## 2017-10-16 SURGERY — POSTERIOR LUMBAR FUSION 1 LEVEL
Anesthesia: General | Site: Back

## 2017-10-16 MED ORDER — THROMBIN 20000 UNITS EX SOLR
CUTANEOUS | Status: DC | PRN
  Administered 2017-10-16: 11:00:00

## 2017-10-16 MED ORDER — THROMBIN 5000 UNITS EX SOLR
CUTANEOUS | Status: AC
  Filled 2017-10-16: qty 5000

## 2017-10-16 MED ORDER — BUPIVACAINE HCL (PF) 0.25 % IJ SOLN
INTRAMUSCULAR | Status: AC
  Filled 2017-10-16: qty 30

## 2017-10-16 MED ORDER — PROMETHAZINE HCL 25 MG/ML IJ SOLN: 6.2500 mg | INTRAMUSCULAR | Status: DC | PRN

## 2017-10-16 MED ORDER — MORPHINE SULFATE (PF) 2 MG/ML IV SOLN
2.0000 mg | INTRAVENOUS | Status: DC | PRN
  Administered 2017-10-16 – 2017-10-17 (×8): 2 mg via INTRAVENOUS
  Filled 2017-10-16 (×7): qty 1

## 2017-10-16 MED ORDER — MIDAZOLAM HCL 2 MG/2ML IJ SOLN
INTRAMUSCULAR | Status: AC
  Filled 2017-10-16: qty 2

## 2017-10-16 MED ORDER — ACETAMINOPHEN 160 MG/5ML PO SOLN: 325.0000 mg | ORAL | Status: DC | PRN

## 2017-10-16 MED ORDER — DEXAMETHASONE SODIUM PHOSPHATE 10 MG/ML IJ SOLN
INTRAMUSCULAR | Status: AC
  Filled 2017-10-16: qty 1

## 2017-10-16 MED ORDER — HYDROMORPHONE HCL 1 MG/ML IJ SOLN
INTRAMUSCULAR | Status: AC
  Filled 2017-10-16: qty 1

## 2017-10-16 MED ORDER — CEFAZOLIN SODIUM-DEXTROSE 2-4 GM/100ML-% IV SOLN
2.0000 g | INTRAVENOUS | Status: AC
  Administered 2017-10-16: 2 g via INTRAVENOUS
  Filled 2017-10-16: qty 100

## 2017-10-16 MED ORDER — SODIUM CHLORIDE 0.9% FLUSH
3.0000 mL | Freq: Two times a day (BID) | INTRAVENOUS | Status: DC
  Administered 2017-10-16 – 2017-10-17 (×2): 3 mL via INTRAVENOUS

## 2017-10-16 MED ORDER — SENNA 8.6 MG PO TABS
1.0000 | ORAL_TABLET | Freq: Two times a day (BID) | ORAL | Status: DC
  Administered 2017-10-16 – 2017-10-17 (×3): 8.6 mg via ORAL
  Filled 2017-10-16 (×3): qty 1

## 2017-10-16 MED ORDER — LIDOCAINE 2% (20 MG/ML) 5 ML SYRINGE
INTRAMUSCULAR | Status: AC
  Filled 2017-10-16: qty 5

## 2017-10-16 MED ORDER — OXYCODONE-ACETAMINOPHEN 5-325 MG PO TABS
1.0000 | ORAL_TABLET | Freq: Four times a day (QID) | ORAL | Status: DC | PRN
  Administered 2017-10-16 – 2017-10-17 (×3): 1 via ORAL
  Filled 2017-10-16 (×3): qty 1

## 2017-10-16 MED ORDER — MIDAZOLAM HCL 5 MG/5ML IJ SOLN
INTRAMUSCULAR | Status: DC | PRN
  Administered 2017-10-16: 2 mg via INTRAVENOUS

## 2017-10-16 MED ORDER — PHENOL 1.4 % MT LIQD: 1.0000 | OROMUCOSAL | Status: DC | PRN

## 2017-10-16 MED ORDER — MORPHINE SULFATE (PF) 2 MG/ML IV SOLN
INTRAVENOUS | Status: AC
  Filled 2017-10-16: qty 1

## 2017-10-16 MED ORDER — BUPIVACAINE HCL (PF) 0.25 % IJ SOLN
INTRAMUSCULAR | Status: DC | PRN
  Administered 2017-10-16: 10 mL

## 2017-10-16 MED ORDER — SODIUM CHLORIDE 0.9 % IV SOLN: 250.0000 mL | INTRAVENOUS | Status: DC

## 2017-10-16 MED ORDER — 0.9 % SODIUM CHLORIDE (POUR BTL) OPTIME
TOPICAL | Status: DC | PRN
  Administered 2017-10-16: 1000 mL

## 2017-10-16 MED ORDER — ACETAMINOPHEN 10 MG/ML IV SOLN: 1000.0000 mg | Freq: Once | INTRAVENOUS | Status: DC | PRN

## 2017-10-16 MED ORDER — ACETAMINOPHEN 10 MG/ML IV SOLN
1000.0000 mg | Freq: Once | INTRAVENOUS | Status: AC
  Administered 2017-10-16: 1000 mg via INTRAVENOUS

## 2017-10-16 MED ORDER — FENTANYL CITRATE (PF) 250 MCG/5ML IJ SOLN
INTRAMUSCULAR | Status: AC
  Filled 2017-10-16: qty 5

## 2017-10-16 MED ORDER — ONDANSETRON HCL 4 MG/2ML IJ SOLN
INTRAMUSCULAR | Status: AC
  Filled 2017-10-16: qty 2

## 2017-10-16 MED ORDER — ONDANSETRON HCL 4 MG/2ML IJ SOLN: 4.0000 mg | Freq: Four times a day (QID) | INTRAMUSCULAR | Status: DC | PRN

## 2017-10-16 MED ORDER — METHOCARBAMOL 1000 MG/10ML IJ SOLN
500.0000 mg | Freq: Four times a day (QID) | INTRAVENOUS | Status: DC | PRN
  Filled 2017-10-16: qty 5

## 2017-10-16 MED ORDER — CEFAZOLIN SODIUM-DEXTROSE 2-4 GM/100ML-% IV SOLN
2.0000 g | Freq: Three times a day (TID) | INTRAVENOUS | Status: AC
  Administered 2017-10-16 – 2017-10-17 (×2): 2 g via INTRAVENOUS
  Filled 2017-10-16 (×4): qty 100

## 2017-10-16 MED ORDER — HEPARIN SODIUM (PORCINE) 1000 UNIT/ML IJ SOLN
INTRAMUSCULAR | Status: AC
  Filled 2017-10-16: qty 1

## 2017-10-16 MED ORDER — CHLORHEXIDINE GLUCONATE CLOTH 2 % EX PADS: 6.0000 | MEDICATED_PAD | Freq: Once | CUTANEOUS | Status: DC

## 2017-10-16 MED ORDER — ACETAMINOPHEN 650 MG RE SUPP: 650.0000 mg | RECTAL | Status: DC | PRN

## 2017-10-16 MED ORDER — PROPOFOL 1000 MG/100ML IV EMUL
INTRAVENOUS | Status: AC
  Filled 2017-10-16: qty 100

## 2017-10-16 MED ORDER — ACETAMINOPHEN 325 MG PO TABS: 650.0000 mg | ORAL_TABLET | ORAL | Status: DC | PRN

## 2017-10-16 MED ORDER — VANCOMYCIN HCL 1000 MG IV SOLR
INTRAVENOUS | Status: DC | PRN
  Administered 2017-10-16: 1000 mg via TOPICAL

## 2017-10-16 MED ORDER — THROMBIN (RECOMBINANT) 20000 UNITS EX SOLR
CUTANEOUS | Status: AC
  Filled 2017-10-16: qty 20000

## 2017-10-16 MED ORDER — THROMBIN 5000 UNITS EX SOLR
OROMUCOSAL | Status: DC | PRN
  Administered 2017-10-16: 11:00:00

## 2017-10-16 MED ORDER — KETAMINE HCL 50 MG/5ML IJ SOSY
PREFILLED_SYRINGE | INTRAMUSCULAR | Status: AC
  Filled 2017-10-16: qty 5

## 2017-10-16 MED ORDER — ROCURONIUM BROMIDE 10 MG/ML (PF) SYRINGE
PREFILLED_SYRINGE | INTRAVENOUS | Status: DC | PRN
  Administered 2017-10-16: 20 mg via INTRAVENOUS
  Administered 2017-10-16: 50 mg via INTRAVENOUS
  Administered 2017-10-16: 20 mg via INTRAVENOUS

## 2017-10-16 MED ORDER — HYDROMORPHONE HCL 1 MG/ML IJ SOLN
0.2500 mg | INTRAMUSCULAR | Status: DC | PRN
  Administered 2017-10-16 (×4): 0.5 mg via INTRAVENOUS

## 2017-10-16 MED ORDER — ANASTROZOLE 1 MG PO TABS
1.0000 mg | ORAL_TABLET | Freq: Every day | ORAL | Status: DC
  Administered 2017-10-17: 1 mg via ORAL
  Filled 2017-10-16: qty 1

## 2017-10-16 MED ORDER — SODIUM CHLORIDE 0.9% FLUSH: 3.0000 mL | INTRAVENOUS | Status: DC | PRN

## 2017-10-16 MED ORDER — DIAZEPAM 5 MG/ML IJ SOLN
2.5000 mg | Freq: Once | INTRAMUSCULAR | Status: AC
  Administered 2017-10-16: 2.5 mg via INTRAVENOUS

## 2017-10-16 MED ORDER — VANCOMYCIN HCL 1000 MG IV SOLR
INTRAVENOUS | Status: AC
  Filled 2017-10-16: qty 1000

## 2017-10-16 MED ORDER — METHOCARBAMOL 500 MG PO TABS
500.0000 mg | ORAL_TABLET | Freq: Four times a day (QID) | ORAL | Status: DC | PRN
  Administered 2017-10-16: 500 mg via ORAL
  Filled 2017-10-16: qty 1

## 2017-10-16 MED ORDER — SODIUM CHLORIDE 0.9 % IV SOLN
INTRAVENOUS | Status: DC | PRN
  Administered 2017-10-16: 15 ug/min via INTRAVENOUS

## 2017-10-16 MED ORDER — PAROXETINE HCL 20 MG PO TABS
20.0000 mg | ORAL_TABLET | Freq: Every evening | ORAL | Status: DC
  Administered 2017-10-16: 20 mg via ORAL
  Filled 2017-10-16: qty 1

## 2017-10-16 MED ORDER — CELECOXIB 200 MG PO CAPS
200.0000 mg | ORAL_CAPSULE | Freq: Two times a day (BID) | ORAL | Status: DC
  Administered 2017-10-16 – 2017-10-17 (×2): 200 mg via ORAL
  Filled 2017-10-16 (×3): qty 1

## 2017-10-16 MED ORDER — ROCURONIUM BROMIDE 50 MG/5ML IV SOSY
PREFILLED_SYRINGE | INTRAVENOUS | Status: AC
  Filled 2017-10-16: qty 15

## 2017-10-16 MED ORDER — HEPARIN SODIUM (PORCINE) 1000 UNIT/ML IJ SOLN
INTRAMUSCULAR | Status: DC | PRN
  Administered 2017-10-16: 5000 [IU] via INTRAPERITONEAL

## 2017-10-16 MED ORDER — DEXAMETHASONE 4 MG PO TABS
4.0000 mg | ORAL_TABLET | Freq: Four times a day (QID) | ORAL | Status: DC
  Administered 2017-10-16 – 2017-10-17 (×2): 4 mg via ORAL
  Filled 2017-10-16 (×2): qty 1

## 2017-10-16 MED ORDER — FENTANYL CITRATE (PF) 100 MCG/2ML IJ SOLN
INTRAMUSCULAR | Status: DC | PRN
  Administered 2017-10-16: 100 ug via INTRAVENOUS
  Administered 2017-10-16 (×3): 50 ug via INTRAVENOUS

## 2017-10-16 MED ORDER — POTASSIUM CHLORIDE IN NACL 20-0.9 MEQ/L-% IV SOLN
INTRAVENOUS | Status: DC
  Administered 2017-10-16: 17:00:00 via INTRAVENOUS
  Filled 2017-10-16: qty 1000

## 2017-10-16 MED ORDER — MENTHOL 3 MG MT LOZG: 1.0000 | LOZENGE | OROMUCOSAL | Status: DC | PRN

## 2017-10-16 MED ORDER — ACETAMINOPHEN 325 MG PO TABS: 325.0000 mg | ORAL_TABLET | ORAL | Status: DC | PRN

## 2017-10-16 MED ORDER — ASPIRIN EC 81 MG PO TBEC
81.0000 mg | DELAYED_RELEASE_TABLET | Freq: Every day | ORAL | Status: DC
  Administered 2017-10-16: 81 mg via ORAL
  Filled 2017-10-16: qty 1

## 2017-10-16 MED ORDER — KETAMINE HCL 10 MG/ML IJ SOLN
INTRAMUSCULAR | Status: DC | PRN
  Administered 2017-10-16: 30 mg via INTRAVENOUS
  Administered 2017-10-16: 10 mg via INTRAVENOUS

## 2017-10-16 MED ORDER — SODIUM CHLORIDE 0.9 % IV SOLN
INTRAVENOUS | Status: DC | PRN
  Administered 2017-10-16: 500 mL

## 2017-10-16 MED ORDER — DEXAMETHASONE SODIUM PHOSPHATE 10 MG/ML IJ SOLN
10.0000 mg | INTRAMUSCULAR | Status: AC
  Administered 2017-10-16: 10 mg via INTRAVENOUS
  Filled 2017-10-16: qty 1

## 2017-10-16 MED ORDER — INFLUENZA VAC SPLIT HIGH-DOSE 0.5 ML IM SUSY
0.5000 mL | PREFILLED_SYRINGE | INTRAMUSCULAR | Status: AC
  Administered 2017-10-17: 0.5 mL via INTRAMUSCULAR
  Filled 2017-10-16: qty 0.5

## 2017-10-16 MED ORDER — PROPOFOL 10 MG/ML IV BOLUS
INTRAVENOUS | Status: DC | PRN
  Administered 2017-10-16: 150 mg via INTRAVENOUS

## 2017-10-16 MED ORDER — PROPOFOL 10 MG/ML IV BOLUS
INTRAVENOUS | Status: AC
  Filled 2017-10-16: qty 20

## 2017-10-16 MED ORDER — LACTATED RINGERS IV SOLN
INTRAVENOUS | Status: DC
  Administered 2017-10-16 (×2): via INTRAVENOUS

## 2017-10-16 MED ORDER — DEXAMETHASONE SODIUM PHOSPHATE 4 MG/ML IJ SOLN
4.0000 mg | Freq: Four times a day (QID) | INTRAMUSCULAR | Status: DC
  Administered 2017-10-17 (×2): 4 mg via INTRAVENOUS
  Filled 2017-10-16 (×2): qty 1

## 2017-10-16 MED ORDER — DIAZEPAM 5 MG/ML IJ SOLN
INTRAMUSCULAR | Status: AC
  Filled 2017-10-16: qty 2

## 2017-10-16 MED ORDER — ACETAMINOPHEN 10 MG/ML IV SOLN
INTRAVENOUS | Status: AC
  Filled 2017-10-16: qty 100

## 2017-10-16 MED ORDER — ONDANSETRON HCL 4 MG PO TABS: 4.0000 mg | ORAL_TABLET | Freq: Four times a day (QID) | ORAL | Status: DC | PRN

## 2017-10-16 MED ORDER — LIDOCAINE 20MG/ML (2%) 15 ML SYRINGE OPTIME
INTRAMUSCULAR | Status: DC | PRN
  Administered 2017-10-16: 60 mg via INTRAVENOUS

## 2017-10-16 SURGICAL SUPPLY — 68 items
ADH SKN CLS APL DERMABOND .7 (GAUZE/BANDAGES/DRESSINGS) ×1
APL SKNCLS STERI-STRIP NONHPOA (GAUZE/BANDAGES/DRESSINGS) ×1
BAG DECANTER FOR FLEXI CONT (MISCELLANEOUS) ×3 IMPLANT
BASKET BONE COLLECTION (BASKET) ×3 IMPLANT
BENZOIN TINCTURE PRP APPL 2/3 (GAUZE/BANDAGES/DRESSINGS) ×3 IMPLANT
BLADE CLIPPER SURG (BLADE) IMPLANT
BUR MATCHSTICK NEURO 3.0 LAGG (BURR) ×3 IMPLANT
CANISTER SUCT 3000ML PPV (MISCELLANEOUS) ×3 IMPLANT
CARTRIDGE OIL MAESTRO DRILL (MISCELLANEOUS) ×1 IMPLANT
CLOSURE WOUND 1/2 X4 (GAUZE/BANDAGES/DRESSINGS) ×2
CONT SPEC 4OZ CLIKSEAL STRL BL (MISCELLANEOUS) ×3 IMPLANT
COVER BACK TABLE 60X90IN (DRAPES) ×3 IMPLANT
COVER WAND RF STERILE (DRAPES) ×3 IMPLANT
DERMABOND ADVANCED (GAUZE/BANDAGES/DRESSINGS) ×2
DERMABOND ADVANCED .7 DNX12 (GAUZE/BANDAGES/DRESSINGS) ×1 IMPLANT
DIFFUSER DRILL AIR PNEUMATIC (MISCELLANEOUS) ×3 IMPLANT
DRAPE C-ARM 42X72 X-RAY (DRAPES) ×6 IMPLANT
DRAPE LAPAROTOMY 100X72X124 (DRAPES) ×3 IMPLANT
DRAPE POUCH INSTRU U-SHP 10X18 (DRAPES) ×3 IMPLANT
DRAPE SURG 17X23 STRL (DRAPES) ×3 IMPLANT
DRSG OPSITE POSTOP 4X6 (GAUZE/BANDAGES/DRESSINGS) ×2 IMPLANT
DURAPREP 26ML APPLICATOR (WOUND CARE) ×5 IMPLANT
ELECT REM PT RETURN 9FT ADLT (ELECTROSURGICAL) ×3
ELECTRODE REM PT RTRN 9FT ADLT (ELECTROSURGICAL) ×1 IMPLANT
EVACUATOR 1/8 PVC DRAIN (DRAIN) ×3 IMPLANT
GAUZE 4X4 16PLY RFD (DISPOSABLE) IMPLANT
GLOVE BIO SURGEON STRL SZ7 (GLOVE) IMPLANT
GLOVE BIO SURGEON STRL SZ8 (GLOVE) ×6 IMPLANT
GLOVE BIOGEL M 7.0 STRL (GLOVE) ×4 IMPLANT
GLOVE BIOGEL PI IND STRL 7.0 (GLOVE) IMPLANT
GLOVE BIOGEL PI IND STRL 7.5 (GLOVE) IMPLANT
GLOVE BIOGEL PI IND STRL 8 (GLOVE) IMPLANT
GLOVE BIOGEL PI INDICATOR 7.0 (GLOVE) ×2
GLOVE BIOGEL PI INDICATOR 7.5 (GLOVE) ×2
GLOVE BIOGEL PI INDICATOR 8 (GLOVE) ×4
GLOVE ECLIPSE 7.5 STRL STRAW (GLOVE) ×6 IMPLANT
GOWN STRL REUS W/ TWL LRG LVL3 (GOWN DISPOSABLE) IMPLANT
GOWN STRL REUS W/ TWL XL LVL3 (GOWN DISPOSABLE) ×2 IMPLANT
GOWN STRL REUS W/TWL 2XL LVL3 (GOWN DISPOSABLE) ×4 IMPLANT
GOWN STRL REUS W/TWL LRG LVL3 (GOWN DISPOSABLE) ×9
GOWN STRL REUS W/TWL XL LVL3 (GOWN DISPOSABLE) ×3
HEMOSTAT POWDER KIT SURGIFOAM (HEMOSTASIS) ×2 IMPLANT
KIT BASIN OR (CUSTOM PROCEDURE TRAY) ×3 IMPLANT
KIT BONE MRW ASP ANGEL CPRP (KITS) ×2 IMPLANT
KIT TURNOVER KIT B (KITS) ×3 IMPLANT
MILL MEDIUM DISP (BLADE) ×3 IMPLANT
NDL HYPO 25X1 1.5 SAFETY (NEEDLE) ×1 IMPLANT
NEEDLE HYPO 25X1 1.5 SAFETY (NEEDLE) ×3 IMPLANT
NS IRRIG 1000ML POUR BTL (IV SOLUTION) ×3 IMPLANT
OIL CARTRIDGE MAESTRO DRILL (MISCELLANEOUS) ×3
PACK LAMINECTOMY NEURO (CUSTOM PROCEDURE TRAY) ×3 IMPLANT
PAD ARMBOARD 7.5X6 YLW CONV (MISCELLANEOUS) ×9 IMPLANT
PUTTY DBM ALLOSYNC PURE 10CC (Putty) ×2 IMPLANT
ROD PC 5.5X40 TI ARSENAL (Rod) ×4 IMPLANT
SCREW CORT CBX 5.5X40 (Screw) ×8 IMPLANT
SCREW SET SPINAL ARSENAL 47127 (Screw) ×8 IMPLANT
SPACER IDENTITI PS 9X9X25 10D (Spacer) ×4 IMPLANT
SPONGE LAP 4X18 RFD (DISPOSABLE) IMPLANT
SPONGE SURGIFOAM ABS GEL 100 (HEMOSTASIS) ×3 IMPLANT
STRIP CLOSURE SKIN 1/2X4 (GAUZE/BANDAGES/DRESSINGS) ×4 IMPLANT
SUT VIC AB 0 CT1 18XCR BRD8 (SUTURE) ×1 IMPLANT
SUT VIC AB 0 CT1 8-18 (SUTURE) ×3
SUT VIC AB 2-0 CP2 18 (SUTURE) ×3 IMPLANT
SUT VIC AB 3-0 SH 8-18 (SUTURE) ×6 IMPLANT
TOWEL GREEN STERILE (TOWEL DISPOSABLE) ×3 IMPLANT
TOWEL GREEN STERILE FF (TOWEL DISPOSABLE) ×3 IMPLANT
TRAY FOLEY MTR SLVR 16FR STAT (SET/KITS/TRAYS/PACK) ×3 IMPLANT
WATER STERILE IRR 1000ML POUR (IV SOLUTION) ×3 IMPLANT

## 2017-10-16 NOTE — Transfer of Care (Signed)
Immediate Anesthesia Transfer of Care Note  Patient: Holly Best  Procedure(s) Performed: Posterior Lumbar Interbody Fusion - Lumbar Two-Lumbar Three - Posterior Lateral and Interbody fusion (N/A Back)  Patient Location: PACU  Anesthesia Type:General  Level of Consciousness: awake and patient cooperative  Airway & Oxygen Therapy: Patient Spontanous Breathing and Patient connected to nasal cannula oxygen  Post-op Assessment: Report given to RN, Post -op Vital signs reviewed and stable and Patient moving all extremities  Post vital signs: Reviewed and stable  Last Vitals:  Vitals Value Taken Time  BP 122/66 10/16/2017  1:32 PM  Temp    Pulse 76 10/16/2017  1:32 PM  Resp 9 10/16/2017  1:32 PM  SpO2 96 % 10/16/2017  1:32 PM  Vitals shown include unvalidated device data.  Last Pain:  Vitals:   10/16/17 0844  TempSrc:   PainSc: 7       Patients Stated Pain Goal: 3 (46/80/32 1224)  Complications: No apparent anesthesia complications

## 2017-10-16 NOTE — H&P (Signed)
Subjective: Patient is a 67 y.o. female admitted for PLIF. Onset of symptoms was several months ago, gradually worsening since that time.  The pain is rated severe, unremitting, and is located at the across the lower back and radiates to RLE. The pain is described as aching and occurs all day. The symptoms have been progressive. Symptoms are exacerbated by exercise. MRI or CT showed spondylolisthesis with stenosis l2-3   Past Medical History:  Diagnosis Date  . Cancer [facility])    right breast cancer  . Crohn disease ([place])    back in 1991, [place] in 2002 and no longer has problems with this  . Depression   . Family history of breast cancer   . History of radiation therapy 09/10/16- 10/08/16   Right Breast 2.67 Gy in 15 fractions, Right Breast boost, 2 Gy in 5 fractions.   . Hypercholesteremia     Past Surgical History:  Procedure Laterality Date  . BREAST LUMPECTOMY WITH RADIOACTIVE SEED AND SENTINEL LYMPH NODE BIOPSY Right 08/06/2016   Procedure: BREAST LUMPECTOMY WITH RADIOACTIVE SEED AND SENTINEL LYMPH NODE BIOPSY;  Surgeon: Jovita Kussmaul, MD;  Location: [place];  Service: General;  Laterality: Right;  . COLONOSCOPY    . dental implants     upper  . LUMBAR LAMINECTOMY Bilateral 03/23/2013   Procedure: MICRO LUMBAR DECOMPRESSION, FORAMINOTOMY L4-5;  Surgeon: Johnn Hai, MD;  Location: [facility];  Service: Orthopedics;  Laterality: Bilateral;    Prior to Admission medications   Medication Sig Start Date End Date Taking? Authorizing Provider  anastrozole (ARIMIDEX) 1 MG tablet Take 1 tablet (1 mg total) by mouth daily. 07/20/17  Yes Magrinat, [name], MD  atorvastatin (LIPITOR) 10 MG tablet Take 5 mg by mouth every Monday, Wednesday, and Friday. In the evening 09/08/17  Yes [provider]  calcium carbonate (TUMS - DOSED IN MG ELEMENTAL CALCIUM) 500 MG chewable tablet Chew 500 mg by mouth at bedtime.    Yes [provider]  Cholecalciferol (VITAMIN D3) 2000 units TABS Take  2,000 Units by mouth daily.   Yes [provider]  LORazepam (ATIVAN) 1 MG tablet Take 1 mg by mouth at bedtime.   Yes [provider]  oxyCODONE-acetaminophen (PERCOCET/ROXICET) 5-325 MG tablet Take 1 tablet by mouth every 6 (six) hours as needed for severe pain.   Yes [provider]  PARoxetine (PAXIL) 20 MG tablet Take 20 mg by mouth every evening.   Yes [provider]  aspirin EC 81 MG tablet Take 81 mg by mouth at bedtime.     [provider]  naproxen (NAPROSYN) 500 MG tablet Take 500 mg by mouth at bedtime.     [provider]   No Known Allergies  Social History   Tobacco Use  . Smoking status: Former Smoker    Packs/day: 1.00    Years: 10.00    Pack years: 10.00    Types: Cigarettes    Last attempt to quit: 02/17/1978    Years since quitting: 39.6  . Smokeless tobacco: Never Used  Substance Use Topics  . Alcohol use: Yes    Comment: occassionally, on weekends    Family History  Problem Relation Age of Onset  . Breast cancer Sister 56  . Breast cancer Other 55  . Lung cancer Mother   . Lung cancer Father   . Breast cancer Maternal Grandmother        dx in her 9s  . Other Brother  non-malignant spinal tumor  . Breast cancer Maternal Aunt        dx > 50  . Lupus Maternal Aunt   . Breast cancer Maternal Aunt        dx >50     Review of Systems  Positive ROS: neg  All other systems have been reviewed and were otherwise negative with the exception of those mentioned in the HPI and as above.  Objective: Vital signs in last 24 hours: Temp:  [98 F (36.7 C)-98.1 F (36.7 C)] 98 F (36.7 C) (10/11 0818) Pulse Rate:  [74-78] 74 (10/11 0818) Resp:  [18-20] 20 (10/11 0818) BP: (130-135)/(75-82) 130/82 (10/11 0818) SpO2:  [97 %-98 %] 98 % (10/11 0818) Weight:  [71.8 kg] 71.8 kg (10/10 1135)  General Appearance: Alert, cooperative, no distress, appears stated age Head: Normocephalic, without obvious  abnormality, atraumatic Eyes: PERRL, conjunctiva/corneas clear, EOM's intact    Neck: Supple, symmetrical, trachea midline Back: Symmetric, no curvature, ROM normal, no CVA tenderness Lungs:  respirations unlabored Heart: Regular rate and rhythm Abdomen: Soft, non-tender Extremities: Extremities normal, atraumatic, no cyanosis or edema Pulses: 2+ and symmetric all extremities Skin: Skin color, texture, turgor normal, no rashes or lesions  NEUROLOGIC:   Mental status: Alert and oriented x4,  no aphasia, good attention span, fund of knowledge, and memory Motor Exam - grossly normal Sensory Exam - grossly normal Reflexes: trace Coordination - grossly normal Gait - grossly normal Balance - grossly normal Cranial Nerves: I: smell Not tested  II: visual acuity  OS: nl    OD: nl  II: visual fields Full to confrontation  II: pupils Equal, round, reactive to light  III,VII: ptosis None  III,IV,VI: extraocular muscles  Full ROM  V: mastication Normal  V: facial light touch sensation  Normal  V,VII: corneal reflex  Present  VII: facial muscle function - upper  Normal  VII: facial muscle function - lower Normal  VIII: hearing Not tested  IX: soft palate elevation  Normal  IX,X: gag reflex Present  XI: trapezius strength  5/5  XI: sternocleidomastoid strength 5/5  XI: neck flexion strength  5/5  XII: tongue strength  Normal    Data Review Lab Results  Component Value Date   WBC 5.8 10/15/2017   HGB 13.0 10/15/2017   HCT 41.6 10/15/2017   MCV 100.2 (H) 10/15/2017   PLT 375 10/15/2017   Lab Results  Component Value Date   NA 138 10/15/2017   K 4.1 10/15/2017   CL 103 10/15/2017   CO2 26 10/15/2017   BUN 14 10/15/2017   CREATININE 0.81 10/15/2017   GLUCOSE 91 10/15/2017   Lab Results  Component Value Date   INR 0.97 10/15/2017    Assessment/Plan:  Estimated body mass index is 25.53 kg/m as calculated from the following:   Height as of 10/15/17: 5' 6"  (1.676 m).    Weight as of 10/15/17: 71.8 kg. Patient admitted for PLIF L2-3. Patient has failed a reasonable attempt at conservative therapy.  I explained the condition and procedure to the patient and answered any questions.  Patient wishes to proceed with procedure as planned. Understands risks/ benefits and typical outcomes of procedure.   Holly Best S 10/16/2017 8:19 AM

## 2017-10-16 NOTE — Progress Notes (Signed)
Orthopedic Tech Progress Note Patient Details:  Holly Best [birth year] [id number]  Patient ID: [name], female   DOB: 1950/12/09, 67 y.o.   MRN: [id number]   [place] Pink 10/16/2017, 3:47 PMCalled [employer] for Quikdraw brace.

## 2017-10-16 NOTE — Anesthesia Postprocedure Evaluation (Signed)
Anesthesia Post Note  Patient: Holly Best  Procedure(s) Performed: Posterior Lumbar Interbody Fusion - Lumbar Two-Lumbar Three - Posterior Lateral and Interbody fusion (N/A Back)     Patient location during evaluation: PACU Anesthesia Type: General Level of consciousness: awake and alert Pain management: pain level controlled Vital Signs Assessment: post-procedure vital signs reviewed and stable Respiratory status: spontaneous breathing, nonlabored ventilation and respiratory function stable Cardiovascular status: blood pressure returned to baseline and stable Postop Assessment: no apparent nausea or vomiting Anesthetic complications: no    Last Vitals:  Vitals:   10/16/17 0818 10/16/17 1333  BP: 130/82   Pulse: 74   Resp: 20   Temp: 36.7 C 36.5 C  SpO2: 98%     Last Pain:  Vitals:   10/16/17 1333  TempSrc:   PainSc: 10-Worst pain ever                 Brennan Bailey

## 2017-10-16 NOTE — Op Note (Signed)
10/16/2017  1:28 PM  PATIENT:  Holly Best  67 y.o. female  PRE-OPERATIVE DIAGNOSIS: Spondylolisthesis L2-3, right sided disc herniation L2-3, back and right leg pain  POST-OPERATIVE DIAGNOSIS:  same  PROCEDURE:   1. Decompressive lumbar laminectomy L2-3 requiring more work than would be required for a simple exposure of the disk for PLIF in order to adequately decompress the neural elements and address the spinal stenosis 2. Posterior lumbar interbody fusion L2-3 using porous titanium interbody cages packed with morcellized allograft and autograft soaked with a bone marrow aspirate obtained from a separate fascial incision of the right iliac crest 3. Posterior fixation L2-3 using Alphatec cortical pedicle screws.  4. Intertransverse arthrodesis L2-3 using morcellized autograft and allograft.  SURGEON:  Sherley Bounds, MD  ASSISTANTS: Dr. Christella Noa  ANESTHESIA:  General  EBL: Less than 50 ml  Total I/O In: 1300 [I.V.:1300] Out: 145 [Urine:145]  BLOOD ADMINISTERED:none  DRAINS: none   INDICATION FOR PROCEDURE: This patient presented with severe right leg pain refractory to months of medical management and injection therapy. Imaging revealed listhesis with stenosis L2-3. The patient tried a reasonable attempt at conservative medical measures without relief. I recommended decompression and instrumented fusion to address the stenosis as well as the segmental  instability.  Patient understood the risks, benefits, and alternatives and potential outcomes and wished to proceed.  PROCEDURE DETAILS:  The patient was brought to the operating room. After induction of generalized endotracheal anesthesia the patient was rolled into the prone position on chest rolls and all pressure points were padded. The patient's lumbar region was cleaned and then prepped with DuraPrep and draped in the usual sterile fashion. Anesthesia was injected and then a dorsal midline incision was made and carried  down to the lumbosacral fascia. The fascia was opened and the paraspinous musculature was taken down in a subperiosteal fashion to expose L4-3. A self-retaining retractor was placed. Intraoperative fluoroscopy confirmed my level, and I started with placement of the L2 cortical pedicle screws. The pedicle screw entry zones were identified utilizing surface landmarks and  AP and lateral fluoroscopy. I scored the cortex with the high-speed drill and then used the hand drill to drill an upward and outward direction into the pedicle. I then tapped line to line. I then placed a 5.5 x 40 mm cortical pedicle screw into the pedicles of L2 bilaterally. I then turned my attention to the decompression and complete lumbar laminectomies, hemi- facetectomies, and foraminotomies were performed at L2 3. The patient had significant spinal stenosis and this required more work than would be required for a simple exposure of the disc for posterior lumbar interbody fusion which would only require a limited laminotomy. Much more generous decompression and generous foraminotomy was undertaken in order to adequately decompress the neural elements and address the patient's leg pain. The yellow ligament was removed to expose the underlying dura and nerve roots, and generous foraminotomies were performed to adequately decompress the neural elements. Both the exiting and traversing nerve roots were decompressed on both sides until a coronary dilator passed easily along the nerve roots. Once the decompression was complete, I turned my attention to the posterior lower lumbar interbody fusion. The epidural venous vasculature was coagulated and cut sharply. Disc space was incised and the initial discectomy was performed with pituitary rongeurs. The disc space was distracted with sequential distractors to a height of 9 mm. We then used a series of scrapers and shavers to prepare the endplates for fusion. The midline was  prepared with Epstein  curettes. Once the complete discectomy was finished, we packed an appropriate sized interbody cage with local autograft and morcellized allograft, gently retracted the nerve root, and tapped the cage into position at L2-3.  The midline between the cages was packed with morselized autograft and allograft. We then turned our attention to the placement of the lower pedicle screws. The pedicle screw entry zones were identified utilizing surface landmarks and fluoroscopy. I drilled into each pedicle utilizing the hand drill, and tapped each pedicle with the appropriate tap. We palpated with a ball probe to assure no break in the cortex. We then placed 5.5 x 40 mm cortical pedicle screws into the pedicles bilaterally at L3. We then decorticated the transverse processes and laid a mixture of morcellized autograft and allograft out over these to perform intertransverse arthrodesis at L2-3. We then placed lordotic rods into the multiaxial screw heads of the pedicle screws and locked these in position with the locking caps and anti-torque device. We then checked our construct with AP and lateral fluoroscopy. Irrigated with copious amounts of bacitracin-containing saline solution. Inspected the nerve roots once again to assure adequate decompression, lined to the dura with Gelfoam, placed powdered vancomycin into the wound, and closed the muscle and the fascia with 0 Vicryl. Closed the subcutaneous tissues with 2-0 Vicryl and subcuticular tissues with 3-0 Vicryl. The skin was closed with benzoin and Steri-Strips. Dressing was then applied, the patient was awakened from general anesthesia and transported to the recovery room in stable condition. At the end of the procedure all sponge, needle and instrument counts were correct.   PLAN OF CARE: admit to inpatient  PATIENT DISPOSITION:  PACU - hemodynamically stable.   Delay start of Pharmacological VTE agent (>24hrs) due to surgical blood loss or risk of bleeding:   yes

## 2017-10-16 NOTE — Anesthesia Preprocedure Evaluation (Addendum)
Anesthesia Evaluation  Patient identified by MRN, date of birth, ID band Patient awake    Reviewed: Allergy & Precautions, NPO status , Patient's Chart, lab work & pertinent test results  Airway Mallampati: II  TM Distance: >3 FB Neck ROM: Full    Dental no notable dental hx. (+) Dental Advisory Given, Teeth Intact   Pulmonary former smoker,    Pulmonary exam normal breath sounds clear to auscultation       Cardiovascular negative cardio ROS Normal cardiovascular exam Rhythm:Regular Rate:Normal     Neuro/Psych Depression Lumbar spinal stenosis    GI/Hepatic negative GI ROS, Neg liver ROS,   Endo/Other  negative endocrine ROS  Renal/GU negative Renal ROS  negative genitourinary   Musculoskeletal negative musculoskeletal ROS (+)   Abdominal   Peds negative pediatric ROS (+)  Hematology negative hematology ROS (+)   Anesthesia Other Findings   Reproductive/Obstetrics negative OB ROS                            Anesthesia Physical Anesthesia Plan  ASA: II  Anesthesia Plan: General   Post-op Pain Management:    Induction: Intravenous  PONV Risk Score and Plan: 3 and Ondansetron, Dexamethasone and Treatment may vary due to age or medical condition  Airway Management Planned: Oral ETT  Additional Equipment:   Intra-op Plan:   Post-operative Plan: Extubation in OR  Informed Consent: I have reviewed the patients History and Physical, chart, labs and discussed the procedure including the risks, benefits and alternatives for the proposed anesthesia with the patient or authorized representative who has indicated his/her understanding and acceptance.   Dental advisory given  Plan Discussed with: CRNA and Surgeon  Anesthesia Plan Comments:         Anesthesia Quick Evaluation

## 2017-10-16 NOTE — Anesthesia Procedure Notes (Signed)
Procedure Name: Intubation Date/Time: 10/16/2017 10:16 AM Performed by: Lowella Dell, CRNA Pre-anesthesia Checklist: Patient identified, Emergency Drugs available, Suction available and Patient being monitored Patient Re-evaluated:Patient Re-evaluated prior to induction Oxygen Delivery Method: Circle System Utilized Preoxygenation: Pre-oxygenation with 100% oxygen Induction Type: IV induction Ventilation: Mask ventilation without difficulty Laryngoscope Size: Mac and 3 Grade View: Grade I Tube type: Oral Tube size: 7.0 mm Number of attempts: 1 Airway Equipment and Method: Stylet Placement Confirmation: ETT inserted through vocal cords under direct vision,  positive ETCO2 and breath sounds checked- equal and bilateral Secured at: 22 cm Tube secured with: Tape Dental Injury: Teeth and Oropharynx as per pre-operative assessment

## 2017-10-17 MED ORDER — METHOCARBAMOL 500 MG PO TABS: 500.0000 mg | ORAL_TABLET | Freq: Four times a day (QID) | ORAL | 0 refills | Status: DC | PRN

## 2017-10-17 NOTE — Progress Notes (Signed)
OT Evaluation  Completed all education regarding back precautions for ADL and functional mobility. Pt will need 3 in1 for safe DC. Husband able to provide necessary level of assistance.     10/17/17 1100  OT Visit Information  Last OT Received On 10/17/17  Assistance Needed +1  History of Present Illness Pt is a 67 y/o female s/p PLIF L2-L3. PMH including but not limited to R breast cancer and Chron's disease.  Precautions  Precautions Back;Fall  Precaution Booklet Issued Yes (comment)  Required Braces or Orthoses Spinal Brace  Spinal Brace Lumbar corset;Applied in sitting position  Restrictions  Weight Bearing Restrictions No  Home Living  Family/patient expects to be discharged to: Private residence  Living Arrangements Spouse/significant other  Available Help at Discharge Family;Friend(s);Available 24 hours/day  Type of [place] to enter  Entrance Stairs-Number of Steps 1  Home Layout One level  Engineer, manufacturing systems Yes  How Accessible Accessible via walker  [place] - single point;Hand held shower head;Shower seat - built Investment banker, operational (low Medical sales representative  Prior Function  Level of Independence Independent  Communication  Communication No difficulties  Pain Assessment  Pain Assessment 0-10  Pain Score 5  Pain Location back  Pain Descriptors / Indicators Sore;Burning  Pain Intervention(s) Limited activity within patient's tolerance  Cognition  Arousal/Alertness Awake/alert  Behavior During Therapy WFL for tasks assessed/performed  Overall Cognitive Status Within Functional Limits for tasks assessed  Upper Extremity Assessment  Upper Extremity Assessment Overall WFL for tasks assessed  Lower Extremity Assessment  Lower Extremity Assessment Defer to PT evaluation  Cervical / Trunk Assessment  Cervical / Trunk Assessment Other  exceptions (back surgery)  Cervical / Trunk Exceptions s/p lumbar sx  ADL  Overall ADL's  Needs assistance/impaired  Functional mobility during ADLs Supervision/safety  General ADL Comments Educated pt on compensatory strategies for ADL adn funcitonal mobility for ADL. Pt usually able to complete figure 4 position. Pt able to use reacher to assist wtih dressing adnhusband can assist as well. Educated on use of tongs for hygiene after toielting as pt has difficulty reaching to clean her pericarea. Educated on recommendation of 3in1 for use over toilet and as shower chair.   Bed Mobility  Overal bed mobility Needs Assistance  Bed Mobility Sit to Sidelying  Sit to sidelying Supervision  Transfers  Overall transfer level Needs assistance  Equipment used 1 person hand held assist  Transfers Sit to/from Stand;Stand Pivot Transfers  Sit to Stand Supervision  Stand pivot transfers Supervision  General transfer comment educated on importance of breathing techniques during transfers  Balance  Overall balance assessment Needs assistance  Sitting-balance support Feet supported  Sitting balance-Leahy Scale Good  Standing balance support No upper extremity supported  Standing balance-Leahy Scale Fair  OT - End of Session  Equipment Utilized During Treatment Back brace  Activity Tolerance Patient tolerated treatment well  Patient left in bed;with call bell/phone within reach;with family/visitor present  Nurse Communication Mobility status  OT Assessment  OT Recommendation/Assessment Patient does not need any further OT services  OT Visit Diagnosis Unsteadiness on feet (R26.81);Pain  Pain - part of body  (back)  OT Problem List Decreased activity tolerance;Decreased knowledge of use of DME or AE;Decreased knowledge of precautions;Pain  AM-PAC OT "6 Clicks" Daily Activity Outcome Measure  Help from another person eating meals? 4  Help from another person taking care of personal  grooming? 4  Help  from another person toileting, which includes using toliet, bedpan, or urinal? 3  Help from another person bathing (including washing, rinsing, drying)? 3  Help from another person to put on and taking off regular upper body clothing? 4  Help from another person to put on and taking off regular lower body clothing? 3  6 Click Score 21  ADL G Code Conversion CJ  OT Recommendation  Follow Up Recommendations No OT follow up;Supervision - Intermittent  OT Equipment 3 in 1 bedside commode  Acute Rehab OT Goals  Patient Stated Goal decrease pain  OT Goal Formulation All assessment and education complete, DC therapy  OT Time Calculation  OT Start Time (ACUTE ONLY) 0933  OT Stop Time (ACUTE ONLY) 1004  OT Time Calculation (min) 31 min  OT General Charges  $OT Visit 1 Visit  OT Evaluation  $OT Eval Low Complexity 1 Low  OT Treatments  $Self Care/Home Management  8-22 mins  Written Expression  Dominant Hand Right  Maurie Boettcher, OT/L   Acute OT Clinical Specialist [place] Pager [phone] Office [phone]

## 2017-10-17 NOTE — Evaluation (Signed)
Physical Therapy Evaluation Patient Details Name: Holly Best MRN: [id number] DOB: 04-09-1950 Today's Date: 10/17/2017   History of Present Illness  Pt is a 67 y/o female s/p PLIF L2-L3. PMH including but not limited to R breast cancer and Chron's disease.  Clinical Impression  Pt presented supine in bed with HOB elevated, awake and willing to participate in therapy session. Pt's friend present throughout. Prior to admission, pt reported that she was independent with all functional mobility and ADLs. Pt lives with her spouse and will have 24/7 supervision/assistance upon d/c. Pt currently able to perform bed mobility with min A, transfers with min guard and ambulate in hallway without an AD with min guard for safety. Pt able to ascend/descend two steps with min guard as well. PT demonstrated and instructed pt in car transfer as well as reviewed a generalized walking program for pt to initiate upon d/c home. Pt expressed understanding. PT will continue to follow pt acutely to progress mobility as tolerated.     Follow Up Recommendations No PT follow up;Supervision - Intermittent    Equipment Recommendations  None recommended by PT    Recommendations for Other Services       Precautions / Restrictions Precautions Precautions: Back;Fall Precaution Comments: pt able to recall 1/3 back precautions; PT reviewed all as well as log roll technique with pt Required Braces or Orthoses: Spinal Brace Spinal Brace: Lumbar corset;Applied in sitting position Restrictions Weight Bearing Restrictions: No      Mobility  Bed Mobility Overal bed mobility: Needs Assistance Bed Mobility: Rolling;Sidelying to Sit Rolling: Min guard Sidelying to sit: Min assist       General bed mobility comments: increased time and effort, cueing for log roll technique, assist with LE movement off of bed and for trunk elevation  Transfers Overall transfer level: Needs assistance Equipment used: 1 person  hand held assist Transfers: Sit to/from [facility] Sit to Stand: Min guard Stand pivot transfers: Min guard       General transfer comment: increased time and effort, min guard for safety with 1HHA  Ambulation/Gait Ambulation/Gait assistance: Min guard Gait Distance (Feet): 200 Feet Assistive device: 1 person hand held assist;None Gait Pattern/deviations: Step-through pattern;Decreased stride length Gait velocity: decreased   General Gait Details: pt with mild instability but no overt LOB or need for physical assistance, min guard for safety; pt cautious and slow  Stairs Stairs: Yes Stairs assistance: Min guard Stair Management: Two rails;Step to pattern;Forwards Number of Stairs: 2 General stair comments: 2 steps x2 trials; min guard for safety, cueing for technique  Wheelchair Mobility    Modified Rankin (Stroke Patients Only)       Balance Overall balance assessment: Needs assistance Sitting-balance support: Feet supported Sitting balance-Leahy Scale: Good     Standing balance support: No upper extremity supported Standing balance-Leahy Scale: Fair                               Pertinent Vitals/Pain Pain Assessment: 0-10 Pain Score: 7  Pain Location: back Pain Descriptors / Indicators: Sore;Burning Pain Intervention(s): Monitored during session;Repositioned    Home Living Family/patient expects to be discharged to:: Private residence Living Arrangements: Spouse/significant other Available Help at Discharge: Family;Friend(s);Available 24 hours/day Type of Home: House Home Access: Stairs to enter   [employer] of Steps: 1 Home Layout: One level Home Equipment: Cane - single point      Prior Function Level of Independence:  Independent               Hand Dominance        Extremity/Trunk Assessment   Upper Extremity Assessment Upper Extremity Assessment: Overall WFL for tasks assessed;Defer to OT  evaluation    Lower Extremity Assessment Lower Extremity Assessment: Overall WFL for tasks assessed    Cervical / Trunk Assessment Cervical / Trunk Assessment: Other exceptions Cervical / Trunk Exceptions: s/p lumbar sx  Communication   Communication: No difficulties  Cognition Arousal/Alertness: Awake/alert Behavior During Therapy: WFL for tasks assessed/performed Overall Cognitive Status: Within Functional Limits for tasks assessed                                        General Comments General comments (skin integrity, edema, etc.): PT demonstrated and instructed pt in car transfers    Exercises     Assessment/Plan    PT Assessment Patient needs continued PT services  PT Problem List Decreased balance;Decreased mobility;Decreased coordination;Decreased safety awareness;Decreased knowledge of precautions       PT Treatment Interventions DME instruction;Gait training;Functional mobility training;Stair training;Therapeutic exercise;Therapeutic activities;Balance training;Neuromuscular re-education;Patient/family education    PT Goals (Current goals can be found in the Care Plan section)  Acute Rehab PT Goals Patient Stated Goal: decrease pain PT Goal Formulation: With patient Time For Goal Achievement: 10/31/17 Potential to Achieve Goals: Good    Frequency Min 5X/week   Barriers to discharge        Co-evaluation               AM-PAC PT "6 Clicks" Daily Activity  Outcome Measure Difficulty turning over in bed (including adjusting bedclothes, sheets and blankets)?: A Lot Difficulty moving from lying on back to sitting on the side of the bed? : Unable Difficulty sitting down on and standing up from a chair with arms (e.g., wheelchair, bedside commode, etc,.)?: Unable Help needed moving to and from a bed to chair (including a wheelchair)?: A Little Help needed walking in hospital room?: A Little Help needed climbing 3-5 steps with a railing? :  A Little 6 Click Score: 13    End of Session Equipment Utilized During Treatment: Gait belt;Back brace Activity Tolerance: Patient tolerated treatment well Patient left: in chair;with call bell/phone within reach;with family/visitor present Nurse Communication: Mobility status PT Visit Diagnosis: Other abnormalities of gait and mobility (R26.89);Pain Pain - part of body: (back)    Time: 2130-8657 PT Time Calculation (min) (ACUTE ONLY): 26 min   Charges:   PT Evaluation $PT Eval Moderate Complexity: 1 Mod PT Treatments $Gait Training: 8-22 mins        Sherie Don, PT, DPT  Acute Rehabilitation Services Pager [phone] Office [place] 10/17/2017, 10:52 AM

## 2017-10-17 NOTE — Care Management Note (Signed)
Case Management Note  Patient Details  Name: TRUC WINFREE MRN: [id number] Date of Birth: 09-23-1950  Subjective/Objective:      Patient for dc today, needing 3 n 1 and rolling walker, referral made to [facility] with [facility], he will bring to patient's room prior to discharge.              Action/Plan: DC home.  Expected Discharge Date:  10/17/17               Expected Discharge Plan:  Home/Self Care  In-House Referral:     Discharge planning Services  CM Consult  Post Acute Care Choice:  Durable Medical Equipment Choice offered to:  Patient  DME Arranged:  3-N-1, Walker rolling DME Agency:  [place]:    [place]:     Status of Service:  Completed, signed off  If discussed at [place] of Stay Meetings, dates discussed:    Additional Comments:  Zenon Mayo, RN 10/17/2017, 3:31 PM

## 2017-10-17 NOTE — Progress Notes (Signed)
Patient educated on discharge papers, follow up appointment, medication. Taken to car in wheelchair.

## 2017-10-17 NOTE — Plan of Care (Signed)
  Problem: Safety: Goal: Ability to remain free from injury will improve Outcome: Adequate for Discharge   Problem: Education: Goal: Ability to verbalize activity precautions or restrictions will improve Outcome: Adequate for Discharge Goal: Knowledge of the prescribed therapeutic regimen will improve Outcome: Adequate for Discharge Goal: Understanding of discharge needs will improve Outcome: Adequate for Discharge   Problem: Activity: Goal: Ability to avoid complications of mobility impairment will improve Outcome: Adequate for Discharge Goal: Ability to tolerate increased activity will improve Outcome: Adequate for Discharge Goal: Will remain free from falls Outcome: Adequate for Discharge   Problem: Bowel/Gastric: Goal: Gastrointestinal status for postoperative course will improve Outcome: Adequate for Discharge   Problem: Clinical Measurements: Goal: Ability to maintain clinical measurements within normal limits will improve Outcome: Adequate for Discharge Goal: Postoperative complications will be avoided or minimized Outcome: Adequate for Discharge Goal: Diagnostic test results will improve Outcome: Adequate for Discharge   Problem: Pain Management: Goal: Pain level will decrease Outcome: Adequate for Discharge   Problem: Skin Integrity: Goal: Will show signs of wound healing Outcome: Adequate for Discharge   Problem: Health Behavior/Discharge Planning: Goal: Identification of resources available to assist in meeting health care needs will improve Outcome: Adequate for Discharge   Problem: Bladder/Genitourinary: Goal: Urinary functional status for postoperative course will improve Outcome: Adequate for Discharge

## 2017-10-19 MED FILL — Thrombin (Recombinant) For Soln 20000 Unit: CUTANEOUS | Qty: 1 | Status: AC

## 2017-10-20 ENCOUNTER — Ambulatory Visit: Payer: Federal, State, Local not specified - PPO | Admitting: Internal Medicine

## 2017-10-29 NOTE — Discharge Summary (Signed)
Physician Discharge Summary  Patient ID: Holly Best MRN: [id number] DOB/AGE: 05/08/1950 67 y.o.  Admit date: 10/16/2017 Discharge date: 10/29/2017  Admission Diagnoses:Lumbar stenosis L2/3, spondylolisthesis L2/3  Discharge Diagnoses: same Active Problems:   S/P lumbar spinal fusion   Discharged Condition: good  Hospital Course: Holly Best was admitted and taken to the operating room for an uncomplicated lumbar decompression and arthrodesis. Post op she is ambulating, voiding, and tolerating a regular diet. Her wound is clean, dry, and without signs of infection at discharge.   Treatments: surgery: 1. Decompressive lumbar laminectomy L2-3 requiring more work than would be required for a simple exposure of the disk for PLIF in order to adequately decompress the neural elements and address the spinal stenosis 2. Posterior lumbar interbody fusion L2-3 using porous titanium interbody cages packed with morcellized allograft and autograft soaked with a bone marrow aspirate obtained from a separate fascial incision of the right iliac crest 3. Posterior fixation L2-3 using Alphatec cortical pedicle screws.  4. Intertransverse arthrodesis L2-3 using morcellized autograft and allograft.  Discharge Exam: Blood pressure 137/75, pulse 67, temperature 97.7 F (36.5 C), temperature source Oral, resp. rate 20, height 5' 6"  (1.676 m), weight 75.8 kg, SpO2 97 %. General appearance: alert, cooperative, appears stated age and no distress  Disposition:  Spondylolisthesis  Allergies as of 10/17/2017   No Known Allergies     Medication List    STOP taking these medications   naproxen 500 MG tablet Commonly known as:  NAPROSYN     TAKE these medications   anastrozole 1 MG tablet Commonly known as:  ARIMIDEX Take 1 tablet (1 mg total) by mouth daily.   aspirin EC 81 MG tablet Take 81 mg by mouth at bedtime.   atorvastatin 10 MG tablet Commonly known as:  LIPITOR Take 5 mg by  mouth every Monday, Wednesday, and Friday. In the evening   calcium carbonate 500 MG chewable tablet Commonly known as:  TUMS - dosed in mg elemental calcium Chew 500 mg by mouth at bedtime.   LORazepam 1 MG tablet Commonly known as:  ATIVAN Take 1 mg by mouth at bedtime.   methocarbamol 500 MG tablet Commonly known as:  ROBAXIN Take 1 tablet (500 mg total) by mouth every 6 (six) hours as needed for muscle spasms.   oxyCODONE-acetaminophen 5-325 MG tablet Commonly known as:  PERCOCET/ROXICET Take 1 tablet by mouth every 6 (six) hours as needed for severe pain.   PARoxetine 20 MG tablet Commonly known as:  PAXIL Take 20 mg by mouth every evening.   Vitamin D3 2000 units Tabs Take 2,000 Units by mouth daily.      Follow-up Information    Eustace Moore, MD Follow up in 2 day(s).   Specialty:  Neurosurgery Why:  keep your scheduled appointment Contact information: [address] [place] [address] [place] Follow up.   Why:  3 n 1 , rolling walker Contact information: [place] [address] [phone]           Signed: Nalani Andreen L 10/29/2017, 10:40 AM

## 2017-12-11 ENCOUNTER — Other Ambulatory Visit: Payer: Self-pay | Admitting: General Surgery

## 2017-12-11 DIAGNOSIS — N6310 Unspecified lump in the right breast, unspecified quadrant: Secondary | ICD-10-CM

## 2017-12-25 ENCOUNTER — Ambulatory Visit
Admission: RE | Admit: 2017-12-25 | Discharge: 2017-12-25 | Disposition: A | Discharge status: Home / Self Care | Payer: Federal, State, Local not specified - PPO | Source: Ambulatory Visit | Attending: General Surgery | Admitting: General Surgery

## 2017-12-25 DIAGNOSIS — N6310 Unspecified lump in the right breast, unspecified quadrant: Secondary | ICD-10-CM

## 2017-12-25 HISTORY — DX: Personal history of irradiation: Z92.3

## 2018-01-29 NOTE — Progress Notes (Signed)
No show

## 2018-02-01 ENCOUNTER — Encounter: Payer: Self-pay | Admitting: Oncology

## 2018-02-01 ENCOUNTER — Inpatient Hospital Stay: Payer: Federal, State, Local not specified - PPO

## 2018-02-01 ENCOUNTER — Inpatient Hospital Stay: Payer: Federal, State, Local not specified - PPO | Admitting: Oncology

## 2018-02-01 ENCOUNTER — Telehealth: Payer: Self-pay | Admitting: Oncology

## 2018-02-01 NOTE — Telephone Encounter (Signed)
R/s appt per 1/27 sch message. - pt is aware of appt date and time

## 2018-02-01 NOTE — Progress Notes (Signed)
[place]  [phone] [phone]     ID: Holly Best DOB: [birth year]  MR#: [id number]  JJO#:[id number]  Patient Care Team: Manon Hilding, MD as PCP - General (Cardiology) Jovita Kussmaul, MD as Consulting Physician (General Surgery) Lovie Agresta, [name], MD as Consulting Physician (Oncology) Eppie Gibson, MD as Attending Physician (Radiation Oncology) Alden Hipp, MD as Consulting Physician (Obstetrics and Gynecology) Susa Day, MD as Consulting Physician (Orthopedic Surgery) Sandford Craze, MD as Referring Physician (Dermatology) Suella Broad, MD as Consulting Physician (Physical Medicine and Rehabilitation) Virgia Land, MD as Referring Physician (Internal Medicine) Eustace Moore, MD as Consulting Physician (Neurosurgery) OTHER MD:  CHIEF COMPLAINT: Estrogen receptor positive breast cancer  CURRENT TREATMENT: Anastrozole   BREAST CANCER HISTORY: From the original intake note:  Holly Best had screening mammography July 2018 showing breast density category C, and a possible mass in the upper-outer quadrant of the right breast and calcifications in the lower inner quadrant of the left breast. On 07/17/2016 she underwent bilateral diagnostic mammography with ultrasonography and right breast ultrasonography at the [facility]. Breast density was category C. In the left breast, a group of coarse calcifications in the lower inner quadrant measuring 1.8 cm were felt to be benign.  In the right breast there was a spiculated mass in the upper-outer quadrant which was palpable as a 2.5 cm area of thickening at the 11:00 position of the right breast. There was no palpable right axillary adenopathy. Ultrasonography of the right breast mass confirmed a 0.8 cm hypoechoic and irregular mass at the 10:30-11:00 position 6 cm from the nipple. Ultrasound of the right axilla was sonographically benign.  Biopsy of the right breast mass in question  07/22/2016 showed ([id number]) and invasive ductal carcinoma, grade 1, estrogen receptor 100% positive, progesterone receptor 90% positive, both with strong staining intensity, with an MIB-1 of 5%, and HER-2 not amplified with a signals ratio of 1.15 and the number per cell 2.25.  The patient's subsequent history is as detailed below.   INTERVAL HISTORY: Holly Best returns today for follow-up and treatment of her estrogen receptor positive breast cancer. She is accompanied by her husband.  She continues on anastrozole. She is having a lot of arthralgia. She has noticed that she has breast pain. Arm is also sore.   [name]'s last bone density screening on 11/10/2016, showed a T-score of -1.0, which is considered normal.    Her last digital diagnostic right mammography and right breast ultrasound was on 12/25/2017 at [facility] showing Stable lumpectomy changes are seen in the right breast. No suspicious mass or malignant type microcalcifications identified. On physical exam, I do not palpate a mass from 6-8 o'clock in the periareolar region of the right breast where the patient complains of a palpable abnormality. Targeted ultrasound is performed, showing normal tissue in the area of clinical concern from 6-8 o'clock in the right breast. No solid or cystic mass, abnormal shadowing or distortion visualized.  Since her last visit here, she underwent posterior lumbar interbody fusion on 10/16/2017 under Dr. Sherley Bounds. She is seeing Dr. Suella Broad for the pain.   REVIEW OF SYSTEMS: Saraphina had some extreme bone aches yesterday and she was afraid that she would bring something [like an illness] to the cancer center.  She has had some headaches as well.  She is taking Percocet for this and the dose of Percocet is what was recently increased to 3 or 4 times a day.  Despite this  the pain is not well controlled.  She has been taking care of her horses and doing yard work, but not nearly as much  as she could do before. The patient denies visual changes, nausea, vomiting, or dizziness. There has been no unusual cough, phlegm production, or pleurisy. This been no change in bowel or bladder habits. The patient denies unexplained fatigue or unexplained weight loss, bleeding, rash, or fever. A detailed review of systems was otherwise noncontributory.    PAST MEDICAL HISTORY: Past Medical History:  Diagnosis Date  . Cancer [facility])    right breast cancer  . Crohn disease ([place])    back in 1991, [place] in 2002 and no longer has problems with this  . Depression   . Family history of breast cancer   . History of radiation therapy 09/10/16- 10/08/16   Right Breast 2.67 Gy in 15 fractions, Right Breast boost, 2 Gy in 5 fractions.   . Hypercholesteremia   . Personal history of radiation therapy     PAST SURGICAL HISTORY: Past Surgical History:  Procedure Laterality Date  . BREAST LUMPECTOMY Right 2018  . BREAST LUMPECTOMY WITH RADIOACTIVE SEED AND SENTINEL LYMPH NODE BIOPSY Right 08/06/2016   Procedure: BREAST LUMPECTOMY WITH RADIOACTIVE SEED AND SENTINEL LYMPH NODE BIOPSY;  Surgeon: Jovita Kussmaul, MD;  Location: [facility];  Service: General;  Laterality: Right;  . COLONOSCOPY    . dental implants     upper  . LUMBAR LAMINECTOMY Bilateral 03/23/2013   Procedure: MICRO LUMBAR DECOMPRESSION, FORAMINOTOMY L4-5;  Surgeon: Johnn Hai, MD;  Location: [facility];  Service: Orthopedics;  Laterality: Bilateral;  . POSTERIOR LUMBAR FUSION  10/16/2017    Posterior Lumbar Interbody Fusion - Lumbar Two-Lumbar Three -     FAMILY HISTORY Family History  Problem Relation Age of Onset  . Breast cancer Sister 54  . Breast cancer Other 55  . Lung cancer Mother   . Lung cancer Father   . Breast cancer Maternal Grandmother        dx in her 9s  . Other Brother        non-malignant spinal tumor  . Breast cancer Maternal Aunt        dx > 50  . Lupus Maternal Aunt   . Breast cancer Maternal Aunt         dx >50  The patient's father died from lung cancer at age 88. The patient's mother also died from lung cancer at age 84. The patient has 3 brothers, one sister. The patient's sister was diagnosed with breast cancer at age 89. The patient had a niece with breast cancer (not her sister's daughter) at age 70.   GYNECOLOGIC HISTORY:  No LMP recorded. Patient is postmenopausal. Menarche age 40, first live birth age 40 the patient is [place] P1. She went through the change of life approximately age 68. She did not take hormone replacement. She never used oral contraceptives.  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 Her husband [name]") worked in [employer] fundraiser. Their son lives in [place] and works for [employer]. The patient has 2 grandchildren. They attend a nondenominational church in [place].     ADVANCED DIRECTIVES: In place   HEALTH MAINTENANCE: Social History   Tobacco Use  . Smoking status: Former Smoker    Packs/day: 1.00    Years: 10.00    Pack years: 10.00    Types: Cigarettes    Last attempt  to quit: 02/17/1978    Years since quitting: 39.9  . Smokeless tobacco: Never Used  Substance Use Topics  . Alcohol use: Yes    Comment: occassionally, on weekends  . Drug use: No     Colonoscopy: 2018  PAP:  Bone density:   No Known Allergies  Current Outpatient Medications  Medication Sig Dispense Refill  . anastrozole (ARIMIDEX) 1 MG tablet Take 1 tablet (1 mg total) by mouth daily. 90 tablet 3  . aspirin EC 81 MG tablet Take 81 mg by mouth at bedtime.     Marland Kitchen atorvastatin (LIPITOR) 10 MG tablet Take 5 mg by mouth every Monday, Wednesday, and Friday. In the evening  3  . calcium carbonate (TUMS - DOSED IN MG ELEMENTAL CALCIUM) 500 MG chewable tablet Chew 500 mg by mouth at bedtime.     . Cholecalciferol (VITAMIN D3) 2000 units TABS Take 2,000 Units by mouth daily.    Marland Kitchen LORazepam (ATIVAN) 1 MG tablet Take 1 mg by mouth at  bedtime.    . methocarbamol (ROBAXIN) 500 MG tablet Take 1 tablet (500 mg total) by mouth every 6 (six) hours as needed for muscle spasms. 45 tablet 0  . oxyCODONE-acetaminophen (PERCOCET/ROXICET) 5-325 MG tablet Take 1 tablet by mouth every 6 (six) hours as needed for severe pain.    Marland Kitchen PARoxetine (PAXIL) 20 MG tablet Take 20 mg by mouth every evening.     No current facility-administered medications for this visit.     OBJECTIVE: Middle-aged white woman in no acute distress  Vitals:   02/02/18 1306  BP: 136/74  Pulse: 71  Resp: 16  Temp: 98.1 F (36.7 C)  SpO2: 99%     Body mass index is 26.65 kg/m.   Wt Readings from Last 3 Encounters:  02/02/18 165 lb 1.6 oz (74.9 kg)  10/16/17 167 lb 1.7 oz (75.8 kg)  10/15/17 158 lb 3.2 oz (71.8 kg)   ECOG FS:2 - Symptomatic, <50% confined to bed   Sclerae unicteric, pupils round and equal No cervical or supraclavicular adenopathy Lungs no rales or rhonchi Heart regular rate and rhythm Abd soft, nontender, positive bowel sounds MSK no focal spinal tenderness, no upper extremity lymphedema Neuro: nonfocal, well oriented, appropriate affect Breasts: She is status post right lumpectomy and radiation.  There is significant tenderness to palpation, but no swelling, erythema, and no palpable masses.  There are no skin or nipple changes of concern.  The left breast is also tender although less so.  It is otherwise unremarkable.  Both axillae are benign.  LAB RESULTS:  CMP     Component Value Date/Time   NA 140 02/02/2018 1246   NA 142 12/08/2016 0942   K 4.6 02/02/2018 1246   K 3.8 12/08/2016 0942   CL 105 02/02/2018 1246   CO2 27 02/02/2018 1246   CO2 27 12/08/2016 0942   GLUCOSE 93 02/02/2018 1246   GLUCOSE 83 12/08/2016 0942   BUN 15 02/02/2018 1246   BUN 14.3 12/08/2016 0942   CREATININE 0.89 02/02/2018 1246   CREATININE 0.82 07/20/2017 1056   CREATININE 0.9 12/08/2016 0942   CALCIUM 9.4 02/02/2018 1246   CALCIUM 9.9  12/08/2016 0942   PROT 7.0 02/02/2018 1246   PROT 7.3 12/08/2016 0942   ALBUMIN 4.1 02/02/2018 1246   ALBUMIN 4.3 12/08/2016 0942   AST 15 02/02/2018 1246   AST 17 07/20/2017 1056   AST 18 12/08/2016 0942   ALT 12 02/02/2018 1246   ALT 15  07/20/2017 1056   ALT 16 12/08/2016 0942   ALKPHOS 151 (H) 02/02/2018 1246   ALKPHOS 82 12/08/2016 0942   BILITOT 1.1 02/02/2018 1246   BILITOT 0.9 07/20/2017 1056   BILITOT 1.02 12/08/2016 0942   GFRNONAA >60 02/02/2018 1246   GFRNONAA >60 07/20/2017 1056   GFRAA >60 02/02/2018 1246   GFRAA >60 07/20/2017 1056    No results found for: Ronnald Ramp, A1GS, A2GS, BETS, BETA2SER, GAMS, MSPIKE, SPEI  No results found for: Nils Pyle, [facility]  Lab Results  Component Value Date   WBC 5.8 02/02/2018   NEUTROABS 3.9 02/02/2018   HGB 12.6 02/02/2018   HCT 38.3 02/02/2018   MCV 95.5 02/02/2018   PLT 298 02/02/2018      Chemistry      Component Value Date/Time   NA 140 02/02/2018 1246   NA 142 12/08/2016 0942   K 4.6 02/02/2018 1246   K 3.8 12/08/2016 0942   CL 105 02/02/2018 1246   CO2 27 02/02/2018 1246   CO2 27 12/08/2016 0942   BUN 15 02/02/2018 1246   BUN 14.3 12/08/2016 0942   CREATININE 0.89 02/02/2018 1246   CREATININE 0.82 07/20/2017 1056   CREATININE 0.9 12/08/2016 0942      Component Value Date/Time   CALCIUM 9.4 02/02/2018 1246   CALCIUM 9.9 12/08/2016 0942   ALKPHOS 151 (H) 02/02/2018 1246   ALKPHOS 82 12/08/2016 0942   AST 15 02/02/2018 1246   AST 17 07/20/2017 1056   AST 18 12/08/2016 0942   ALT 12 02/02/2018 1246   ALT 15 07/20/2017 1056   ALT 16 12/08/2016 0942   BILITOT 1.1 02/02/2018 1246   BILITOT 0.9 07/20/2017 1056   BILITOT 1.02 12/08/2016 0942       No results found for: LABCA2  No components found for: [id number]  No results for input(s): INR in the last 168 hours.  Urinalysis No results found for: COLORURINE, APPEARANCEUR, LABSPEC, PHURINE, GLUCOSEU, HGBUR,  BILIRUBINUR, KETONESUR, PROTEINUR, UROBILINOGEN, NITRITE, LEUKOCYTESUR   STUDIES: Mammography and ultrasonography of the right breast, below, discussed with the patient CLINICAL DATA:  History of right breast cancer status post lumpectomy in 2018. Patient complains of a palpable abnormality in the right breast.  EXAM: DIGITAL DIAGNOSTIC RIGHT MAMMOGRAM WITH CAD AND TOMO  ULTRASOUND RIGHT BREAST  COMPARISON:  Previous exam(s).  ACR Breast Density Category c: The breast tissue is heterogeneously dense, which may obscure small masses.  FINDINGS: Stable lumpectomy changes are seen in the right breast. No suspicious mass or malignant type microcalcifications identified.  Mammographic images were processed with CAD.  On physical exam, I do not palpate a mass from 6-8 o'clock in the periareolar region of the right breast where the patient complains of a palpable abnormality.  Targeted ultrasound is performed, showing normal tissue in the area of clinical concern from 6-8 o'clock in the right breast. No solid or cystic mass, abnormal shadowing or distortion visualized.  IMPRESSION: No evidence of malignancy in the right breast.  RECOMMENDATION: Bilateral diagnostic mammogram in July of 2020 is recommended.  I have discussed the findings and recommendations with the patient. Results were also provided in writing at the conclusion of the visit. If applicable, a reminder letter will be sent to the patient regarding the next appointment.  BI-RADS CATEGORY  2: Benign.   Electronically Signed   By: Lillia Mountain M.D.   On: 12/25/2017 12:10      ELIGIBLE FOR AVAILABLE RESEARCH PROTOCOL: no   ASSESSMENT: 68  y.o. [place], [place] woman status post right breast upper outer quadrant biopsy 07/22/2016 for a clinical T1b N0, stage IA invasive ductal carcinoma, grade 1, estrogen and progesterone receptor positive, HER-2 nonamplified, with an MIB-1 of 5%.  (1) Right  lumpectomy 08/06/2016 showed a pT1b pN0, stage IA invasive ductal carcinoma, grade 1, with negative margins.    (2) Genetics testing on 08/19/2016 revealed no deleterious mutations APC, ATM, AXIN2, BARD1, BMPR1A, BRCA1, BRCA2, BRIP1, CDH1, CDKN2A (p14ARF), CDKN2A (p16INK4a), CHEK2, CTNNA1, DICER1, EPCAM (Deletion/duplication testing only), GREM1 (promoter region deletion/duplication testing only), KIT, MEN1, MLH1, MSH2, MSH3, MSH6, MUTYH, NBN, NF1, NHTL1, PALB2, PDGFRA, PMS2, POLD1, POLE, PTEN, RAD50, RAD51C, RAD51D, SDHB, SDHC, SDHD, SMAD4, SMARCA4. STK11, TP53, TSC1, TSC2, and VHL.  The following genes were evaluated for sequence changes only: SDHA and HOXB13 c.251G>A variant only.   (3) Oncotype DX: Score of 16 predicts a 10-year risk of recurrence outside the breast of 10% of the patient's only systemic therapy is tamoxifen for 5 years.  It also predicts no benefit from chemotherapy.  (4) adjuvant radiation 09/10/2016-10/08/2016 Site/dose:   1. Right breast, 2.67 Gy in 15 fractions                         2. Boost, 2 Gy in 5 fractions   (5) Anastrozole started 11/2016  (a) bone density 11/10/2016 showed a T score of -1.0 (normal)    PLAN: Havyn is a year and a half out from definitive surgery for breast cancer with no evidence of disease recurrence.  This is favorable.  She is having considerable bony aches and pains and a great deal of tenderness in both breasts, right greater than left.  She is being treated for this by Dr. Nelva Bush with some improvement but no real resolution.  She does feel the surgery she had under Dr. Ronnald Ramp was quite helpful, and the pain is really not localized to that area.  It is more "all over".  I wonder if the symptoms she is having are due to Lipitor.  I suggested she stop Lipitor and reassess in 3 to 4 weeks.  In fact she will call us in 4 weeks to let us know whether the pain is any better or not.  If it is considerably better then probably she should stay off the  Lipitor and discuss with her primary care physician, Dr. Quintin [facility], which alternative he would prefer for her, perhaps Crestor or a similar medication  If she is no better off the Lipitor then she will go off the anastrozole and we will again have her call me 4 weeks later to assess response.  Also when she sees Dr. Ronnald Ramp in a few weeks she could perhaps request a second opinion regarding pain management from Dr. Maryjean Ka who is in the same office.  He has been very helpful to many of our patients  Otherwise she will see me again in August.  She will be 2 years out from definitive surgery at that time and I hope to be able to start seeing her on a once a year basis thereafter  She knows to call for any other issues that may develop before then.     Eevie Lapp, [name], MD  02/02/18 1:30 PM Medical Oncology and Hematology [facility] [address] [place], [place] [address] Tel. [phone]    Fax. [phone]  I, Jacqualyn Posey am acting as a Education administrator for Chauncey Cruel, MD.  I, Lurline Del MD, have reviewed the above documentation for accuracy and completeness, and I agree with the above.

## 2018-02-02 ENCOUNTER — Telehealth: Payer: Self-pay | Admitting: Oncology

## 2018-02-02 ENCOUNTER — Inpatient Hospital Stay: Payer: Federal, State, Local not specified - PPO | Attending: Oncology

## 2018-02-02 ENCOUNTER — Inpatient Hospital Stay (HOSPITAL_BASED_OUTPATIENT_CLINIC_OR_DEPARTMENT_OTHER): Payer: Federal, State, Local not specified - PPO | Admitting: Oncology

## 2018-02-02 VITALS — BP 136/74 | HR 71 | Temp 98.1°F | Resp 16 | Ht 66.0 in | Wt 165.1 lb

## 2018-02-02 DIAGNOSIS — M898X9 Other specified disorders of bone, unspecified site: Secondary | ICD-10-CM

## 2018-02-02 DIAGNOSIS — C50411 Malignant neoplasm of upper-outer quadrant of right female breast: Secondary | ICD-10-CM | POA: Diagnosis present

## 2018-02-02 DIAGNOSIS — Z87891 Personal history of nicotine dependence: Secondary | ICD-10-CM

## 2018-02-02 DIAGNOSIS — Z170 Estrogen receptor positive status [ER+]: Secondary | ICD-10-CM

## 2018-02-02 DIAGNOSIS — Z79811 Long term (current) use of aromatase inhibitors: Secondary | ICD-10-CM

## 2018-02-02 DIAGNOSIS — Z79899 Other long term (current) drug therapy: Secondary | ICD-10-CM | POA: Insufficient documentation

## 2018-02-02 DIAGNOSIS — Z7982 Long term (current) use of aspirin: Secondary | ICD-10-CM | POA: Diagnosis not present

## 2018-02-02 DIAGNOSIS — Z923 Personal history of irradiation: Secondary | ICD-10-CM | POA: Diagnosis not present

## 2018-02-02 LAB — CBC WITH DIFFERENTIAL/PLATELET
Abs Immature Granulocytes: 0.01 10*3/uL (ref 0.00–0.07)
Basophils Absolute: 0.1 10*3/uL (ref 0.0–0.1)
Basophils Relative: 1 %
Eosinophils Absolute: 0.2 10*3/uL (ref 0.0–0.5)
Eosinophils Relative: 3 %
HEMATOCRIT: 38.3 % (ref 36.0–46.0)
Hemoglobin: 12.6 g/dL (ref 12.0–15.0)
Immature Granulocytes: 0 %
LYMPHS ABS: 1.3 10*3/uL (ref 0.7–4.0)
Lymphocytes Relative: 22 %
MCH: 31.4 pg (ref 26.0–34.0)
MCHC: 32.9 g/dL (ref 30.0–36.0)
MCV: 95.5 fL (ref 80.0–100.0)
MONO ABS: 0.4 10*3/uL (ref 0.1–1.0)
Monocytes Relative: 7 %
Neutro Abs: 3.9 10*3/uL (ref 1.7–7.7)
Neutrophils Relative %: 67 %
Platelets: 298 10*3/uL (ref 150–400)
RBC: 4.01 MIL/uL (ref 3.87–5.11)
RDW: 12.7 % (ref 11.5–15.5)
WBC: 5.8 10*3/uL (ref 4.0–10.5)
nRBC: 0 % (ref 0.0–0.2)

## 2018-02-02 LAB — COMPREHENSIVE METABOLIC PANEL
ALT: 12 U/L (ref 0–44)
AST: 15 U/L (ref 15–41)
Albumin: 4.1 g/dL (ref 3.5–5.0)
Alkaline Phosphatase: 151 U/L — ABNORMAL HIGH (ref 38–126)
Anion gap: 8 (ref 5–15)
BUN: 15 mg/dL (ref 8–23)
CO2: 27 mmol/L (ref 22–32)
Calcium: 9.4 mg/dL (ref 8.9–10.3)
Chloride: 105 mmol/L (ref 98–111)
Creatinine, Ser: 0.89 mg/dL (ref 0.44–1.00)
GFR calc Af Amer: >60 mL/min (ref >60)
GFR calc non Af Amer: >60 mL/min (ref >60)
GLUCOSE: 93 mg/dL (ref 70–99)
Potassium: 4.6 mmol/L (ref 3.5–5.1)
Sodium: 140 mmol/L (ref 135–145)
TOTAL PROTEIN: 7 g/dL (ref 6.5–8.1)
Total Bilirubin: 1.1 mg/dL (ref 0.3–1.2)

## 2018-02-02 NOTE — Telephone Encounter (Signed)
Gave avs and calendar ° °

## 2018-03-09 ENCOUNTER — Other Ambulatory Visit: Payer: Self-pay | Admitting: Dermatology

## 2018-06-23 ENCOUNTER — Telehealth: Payer: Self-pay | Admitting: *Deleted

## 2018-06-23 MED ORDER — CEPHALEXIN 500 MG PO CAPS: 500.0000 mg | ORAL_CAPSULE | Freq: Two times a day (BID) | ORAL | 0 refills | Status: DC

## 2018-06-23 NOTE — Telephone Encounter (Signed)
This RN spoke with pt post MD review informing her antibiotic has been called in .  She needs to start asap as well monitor area of concern.  Discussed need to call if area becomes red- hot and or more swollen.  This RN also asked pt to contact this RN Friday am with update in case we need to see her for further evaluation and recommendations.  Pt verbalized understanding of above.

## 2018-08-24 ENCOUNTER — Other Ambulatory Visit: Payer: Self-pay | Admitting: Oncology

## 2018-08-24 DIAGNOSIS — C50411 Malignant neoplasm of upper-outer quadrant of right female breast: Secondary | ICD-10-CM

## 2018-08-25 ENCOUNTER — Telehealth: Payer: Self-pay

## 2018-08-25 NOTE — Progress Notes (Signed)
[place]  Telephone:(336) [phone] Fax:(336) [phone]     ID: Holly Best DOB: 07/28/1950  MR#: [id number]  MVE#:[id number]  Patient Care Team: Manon Hilding, MD as PCP - General (Cardiology) Jovita Kussmaul, MD as Consulting Physician (General Surgery) Margia Wiesen, [name], MD as Consulting Physician (Oncology) Eppie Gibson, MD as Attending Physician (Radiation Oncology) Alden Hipp, MD as Consulting Physician (Obstetrics and Gynecology) Susa Day, MD as Consulting Physician (Orthopedic Surgery) Sandford Craze, MD as Referring Physician (Dermatology) Suella Broad, MD as Consulting Physician (Physical Medicine and Rehabilitation) Virgia Land, MD as Referring Physician (Internal Medicine) Eustace Moore, MD as Consulting Physician (Neurosurgery) OTHER MD:  CHIEF COMPLAINT: Estrogen receptor positive breast cancer  CURRENT TREATMENT: Anastrozole   BREAST CANCER HISTORY: From the original intake note:  Holly Best had screening mammography July 2018 showing breast density category C, and a possible mass in the upper-outer quadrant of the right breast and calcifications in the lower inner quadrant of the left breast. On 07/17/2016 she underwent bilateral diagnostic mammography with ultrasonography and right breast ultrasonography at the [facility]. Breast density was category C. In the left breast, a group of coarse calcifications in the lower inner quadrant measuring 1.8 cm were felt to be benign.  In the right breast there was a spiculated mass in the upper-outer quadrant which was palpable as a 2.5 cm area of thickening at the 11:00 position of the right breast. There was no palpable right axillary adenopathy. Ultrasonography of the right breast mass confirmed a 0.8 cm hypoechoic and irregular mass at the 10:30-11:00 position 6 cm from the nipple. Ultrasound of the right axilla was sonographically benign.  Biopsy of the right breast mass in question  07/22/2016 showed ([id number]) and invasive ductal carcinoma, grade 1, estrogen receptor 100% positive, progesterone receptor 90% positive, both with strong staining intensity, with an MIB-1 of 5%, and HER-2 not amplified with a signals ratio of 1.15 and the number per cell 2.25.  The patient's subsequent history is as detailed below.   INTERVAL HISTORY: Holly Best returns today for follow-up and treatment of her estrogen receptor positive breast cancer. She was last seen on 02/02/2018.   She continues on anastrozole.  She tolerates this well, with no significant hot flashes or vaginal dryness problems  Holly Best's last bone density screening on 11/10/2016, showed a T-score of -1.0, which is considered normal.    Since her last visit here, she has not undergone any additional studies. Her last mammogram was completed on 12/25/2017.     REVIEW OF SYSTEMS: Holly Best has chronic pain related to her lumbar problems, and it got better with surgery although it was not completely resolved.  She now has also some neck issues.  She is getting shots through Dr. Nelva Bush and that also is helping.  She is taking gabapentin but it makes her sleepy so she only takes it once a day.  She takes Percocet as needed and very appropriately--she will take a tablet and then go out and work in the garden and if the pain gets worse after a while she will take another 1 and go right back to the garden to work.  She is not constipated from this.  A detailed review of systems today was otherwise stable   PAST MEDICAL HISTORY: Past Medical History:  Diagnosis Date  . Cancer [facility])    right breast cancer  . Crohn disease ([place])    back in 1991, [place] in 2002 and no longer has problems  with this  . Depression   . Family history of breast cancer   . History of radiation therapy 09/10/16- 10/08/16   Right Breast 2.67 Gy in 15 fractions, Right Breast boost, 2 Gy in 5 fractions.   . Hypercholesteremia   . Personal history of  radiation therapy     PAST SURGICAL HISTORY: Past Surgical History:  Procedure Laterality Date  . BREAST LUMPECTOMY Right 2018  . BREAST LUMPECTOMY WITH RADIOACTIVE SEED AND SENTINEL LYMPH NODE BIOPSY Right 08/06/2016   Procedure: BREAST LUMPECTOMY WITH RADIOACTIVE SEED AND SENTINEL LYMPH NODE BIOPSY;  Surgeon: Jovita Kussmaul, MD;  Location: [facility];  Service: General;  Laterality: Right;  . COLONOSCOPY    . dental implants     upper  . LUMBAR LAMINECTOMY Bilateral 03/23/2013   Procedure: MICRO LUMBAR DECOMPRESSION, FORAMINOTOMY L4-5;  Surgeon: Johnn Hai, MD;  Location: [facility];  Service: Orthopedics;  Laterality: Bilateral;  . POSTERIOR LUMBAR FUSION  10/16/2017    Posterior Lumbar Interbody Fusion - Lumbar Two-Lumbar Three -     FAMILY HISTORY Family History  Problem Relation Age of Onset  . Breast cancer Sister 18  . Breast cancer Other 55  . Lung cancer Mother   . Lung cancer Father   . Breast cancer Maternal Grandmother        dx in her 4s  . Other Brother        non-malignant spinal tumor  . Breast cancer Maternal Aunt        dx > 50  . Lupus Maternal Aunt   . Breast cancer Maternal Aunt        dx >50  The patient's father died from lung cancer at age 63. The patient's mother also died from lung cancer at age 34. The patient has 3 brothers, one sister. The patient's sister was diagnosed with breast cancer at age 61. The patient had a niece with breast cancer (not her sister's daughter) at age 70.   GYNECOLOGIC HISTORY:  No LMP recorded. Patient is postmenopausal. Menarche age 43, first live birth age 4 the patient is [place] P1. She went through the change of life approximately age 51. She did not take hormone replacement. She never used oral contraceptives.  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 Her husband Holly Best") worked in Charity fundraiser. Their son lives in [place] and works for [employer]. The patient has 2  grandchildren. They attend a nondenominational church in [place].     ADVANCED DIRECTIVES: In place   HEALTH MAINTENANCE: Social History   Tobacco Use  . Smoking status: Former Smoker    Packs/day: 1.00    Years: 10.00    Pack years: 10.00    Types: Cigarettes    Quit date: 02/17/1978    Years since quitting: 40.5  . Smokeless tobacco: Never Used  Substance Use Topics  . Alcohol use: Yes    Comment: occassionally, on weekends  . Drug use: No     Colonoscopy: 2018  PAP:  Bone density:   No Known Allergies  Current Outpatient Medications  Medication Sig Dispense Refill  . anastrozole (ARIMIDEX) 1 MG tablet TAKE ONE TABLET BY MOUTH DAILY. 90 tablet 3  . aspirin EC 81 MG tablet Take 81 mg by mouth at bedtime.     Marland Kitchen atorvastatin (LIPITOR) 10 MG tablet Take 5 mg by mouth every Monday, Wednesday, and Friday. In the evening  3  . calcium carbonate (  TUMS - DOSED IN MG ELEMENTAL CALCIUM) 500 MG chewable tablet Chew 500 mg by mouth at bedtime.     . cephALEXin (KEFLEX) 500 MG capsule Take 1 capsule (500 mg total) by mouth 2 (two) times daily. 10 capsule 0  . Cholecalciferol (VITAMIN D3) 2000 units TABS Take 2,000 Units by mouth daily.    Marland Kitchen LORazepam (ATIVAN) 1 MG tablet Take 1 mg by mouth at bedtime.    . methocarbamol (ROBAXIN) 500 MG tablet Take 1 tablet (500 mg total) by mouth every 6 (six) hours as needed for muscle spasms. 45 tablet 0  . oxyCODONE-acetaminophen (PERCOCET/ROXICET) 5-325 MG tablet Take 1 tablet by mouth every 6 (six) hours as needed for severe pain.    Marland Kitchen PARoxetine (PAXIL) 20 MG tablet Take 20 mg by mouth every evening.     No current facility-administered medications for this visit.     OBJECTIVE: Middle-aged white woman who appears stated age  67:   08/26/18 1439  BP: 121/89  Pulse: 72  Resp: 18  Temp: 98.3 F (36.8 C)  SpO2: 97%    Body mass index is 25.32 kg/m.   Wt Readings from Last 3 Encounters:  08/26/18 156 lb 14.4 oz (71.2 kg)   02/02/18 165 lb 1.6 oz (74.9 kg)  10/16/17 167 lb 1.7 oz (75.8 kg)   ECOG FS:2 - Symptomatic, <50% confined to bed   Sclerae unicteric, EOMs intact Wearing a mask No cervical or supraclavicular adenopathy Lungs no rales or rhonchi Heart regular rate and rhythm Abd soft, nontender, positive bowel sounds MSK no focal spinal tenderness, no upper extremity lymphedema, obvious arthritic changes over both hands including [place] nodes Neuro: nonfocal, well oriented, appropriate affect Breasts: Status post right lumpectomy and radiation.  The whole breast including the axilla continues to be very tender to palpation although there is no erythema or swelling.  The left breast is unremarkable.  Both axillae are benign.  LAB RESULTS:  CMP     Component Value Date/Time   NA 140 02/02/2018 1246   NA 142 12/08/2016 0942   K 4.6 02/02/2018 1246   K 3.8 12/08/2016 0942   CL 105 02/02/2018 1246   CO2 27 02/02/2018 1246   CO2 27 12/08/2016 0942   GLUCOSE 93 02/02/2018 1246   GLUCOSE 83 12/08/2016 0942   BUN 15 02/02/2018 1246   BUN 14.3 12/08/2016 0942   CREATININE 0.89 02/02/2018 1246   CREATININE 0.82 07/20/2017 1056   CREATININE 0.9 12/08/2016 0942   CALCIUM 9.4 02/02/2018 1246   CALCIUM 9.9 12/08/2016 0942   PROT 7.0 02/02/2018 1246   PROT 7.3 12/08/2016 0942   ALBUMIN 4.1 02/02/2018 1246   ALBUMIN 4.3 12/08/2016 0942   AST 15 02/02/2018 1246   AST 17 07/20/2017 1056   AST 18 12/08/2016 0942   ALT 12 02/02/2018 1246   ALT 15 07/20/2017 1056   ALT 16 12/08/2016 0942   ALKPHOS 151 (H) 02/02/2018 1246   ALKPHOS 82 12/08/2016 0942   BILITOT 1.1 02/02/2018 1246   BILITOT 0.9 07/20/2017 1056   BILITOT 1.02 12/08/2016 0942   GFRNONAA >60 02/02/2018 1246   GFRNONAA >60 07/20/2017 1056   GFRAA >60 02/02/2018 1246   GFRAA >60 07/20/2017 1056    No results found for: TOTALPROTELP, ALBUMINELP, A1GS, A2GS, BETS, BETA2SER, GAMS, MSPIKE, SPEI  No results found for: KPAFRELGTCHN,  LAMBDASER, KAPLAMBRATIO  Lab Results  Component Value Date   WBC 5.4 08/26/2018   NEUTROABS 3.2 08/26/2018   HGB 12.0  08/26/2018   HCT 36.5 08/26/2018   MCV 99.5 08/26/2018   PLT 314 08/26/2018      Chemistry      Component Value Date/Time   NA 140 02/02/2018 1246   NA 142 12/08/2016 0942   K 4.6 02/02/2018 1246   K 3.8 12/08/2016 0942   CL 105 02/02/2018 1246   CO2 27 02/02/2018 1246   CO2 27 12/08/2016 0942   BUN 15 02/02/2018 1246   BUN 14.3 12/08/2016 0942   CREATININE 0.89 02/02/2018 1246   CREATININE 0.82 07/20/2017 1056   CREATININE 0.9 12/08/2016 0942      Component Value Date/Time   CALCIUM 9.4 02/02/2018 1246   CALCIUM 9.9 12/08/2016 0942   ALKPHOS 151 (H) 02/02/2018 1246   ALKPHOS 82 12/08/2016 0942   AST 15 02/02/2018 1246   AST 17 07/20/2017 1056   AST 18 12/08/2016 0942   ALT 12 02/02/2018 1246   ALT 15 07/20/2017 1056   ALT 16 12/08/2016 0942   BILITOT 1.1 02/02/2018 1246   BILITOT 0.9 07/20/2017 1056   BILITOT 1.02 12/08/2016 0942       No results found for: LABCA2  No components found for: OFBPZW258  No results for input(s): INR in the last 168 hours.  Urinalysis No results found for: COLORURINE, APPEARANCEUR, LABSPEC, PHURINE, GLUCOSEU, HGBUR, BILIRUBINUR, KETONESUR, PROTEINUR, UROBILINOGEN, NITRITE, LEUKOCYTESUR   STUDIES: No results found.   ELIGIBLE FOR AVAILABLE RESEARCH PROTOCOL: no   ASSESSMENT: 68 y.o. [place], [place] woman status post right breast upper outer quadrant biopsy 07/22/2016 for a clinical T1b N0, stage IA invasive ductal carcinoma, grade 1, estrogen and progesterone receptor positive, HER-2 nonamplified, with an MIB-1 of 5%.  (1) Right lumpectomy 08/06/2016 showed a pT1b pN0, stage IA invasive ductal carcinoma, grade 1, with negative margins.    (2) Genetics testing on 08/19/2016 revealed no deleterious mutations APC, ATM, AXIN2, BARD1, BMPR1A, BRCA1, BRCA2, BRIP1, CDH1, CDKN2A (p14ARF), CDKN2A (p16INK4a), CHEK2, CTNNA1,  DICER1, EPCAM (Deletion/duplication testing only), GREM1 (promoter region deletion/duplication testing only), KIT, MEN1, MLH1, MSH2, MSH3, MSH6, MUTYH, NBN, NF1, NHTL1, PALB2, PDGFRA, PMS2, POLD1, POLE, PTEN, RAD50, RAD51C, RAD51D, SDHB, SDHC, SDHD, SMAD4, SMARCA4. STK11, TP53, TSC1, TSC2, and VHL.  The following genes were evaluated for sequence changes only: SDHA and HOXB13 c.251G>A variant only.   (3) Oncotype DX: Score of 16 predicts a 10-year risk of recurrence outside the breast of 10% of the patient's only systemic therapy is tamoxifen for 5 years.  It also predicts no benefit from chemotherapy.  (4) adjuvant radiation 09/10/2016-10/08/2016 Site/dose:   1. Right breast, 2.67 Gy in 15 fractions                         2. Boost, 2 Gy in 5 fractions   (5) Anastrozole started 11/2016  (a) bone density 11/10/2016 showed a T score of -1.0 (normal)    PLAN: Jaxyn is now 2 years out from definitive surgery for her breast cancer with no evidence of disease recurrence.  This is favorable.  She is tolerating anastrozole well and the plan will be to continue that for 5 years.  She will be due for repeat bone density this year and I will put that order in together with her mammography order.  She is developing neck issues.  She might benefit from traction.  She will discuss this with Dr. Ronnald Ramp when she sees him again regarding this problem  She will return to see me in  1 year.  She knows to call for any other issues that may develop before then.      Suresh Audi, [name], MD  08/26/18 2:55 PM Medical Oncology and Hematology [facility] [address] [place], [place] [address] Tel. [phone]    Fax. [phone]  I, Jacqualyn Posey am acting as a Education administrator for Chauncey Cruel, MD.   I, Lurline Del MD, have reviewed the above documentation for accuracy and completeness, and I agree with the above.

## 2018-08-25 NOTE — Telephone Encounter (Signed)
Left vm for pt regarding prescreening questions for appointment on 8/20.

## 2018-08-26 ENCOUNTER — Other Ambulatory Visit: Payer: Self-pay

## 2018-08-26 ENCOUNTER — Inpatient Hospital Stay: Payer: Medicare Other | Attending: Oncology

## 2018-08-26 ENCOUNTER — Inpatient Hospital Stay (HOSPITAL_BASED_OUTPATIENT_CLINIC_OR_DEPARTMENT_OTHER): Payer: Medicare Other | Admitting: Oncology

## 2018-08-26 VITALS — BP 121/89 | HR 72 | Temp 98.3°F | Resp 18 | Ht 66.0 in | Wt 156.9 lb

## 2018-08-26 DIAGNOSIS — Z170 Estrogen receptor positive status [ER+]: Secondary | ICD-10-CM | POA: Diagnosis not present

## 2018-08-26 DIAGNOSIS — Z79811 Long term (current) use of aromatase inhibitors: Secondary | ICD-10-CM | POA: Insufficient documentation

## 2018-08-26 DIAGNOSIS — C50411 Malignant neoplasm of upper-outer quadrant of right female breast: Secondary | ICD-10-CM | POA: Diagnosis not present

## 2018-08-26 DIAGNOSIS — N951 Menopausal and female climacteric states: Secondary | ICD-10-CM | POA: Insufficient documentation

## 2018-08-26 LAB — CBC WITH DIFFERENTIAL/PLATELET
Abs Immature Granulocytes: 0.02 10*3/uL (ref 0.00–0.07)
Basophils Absolute: 0.1 10*3/uL (ref 0.0–0.1)
Basophils Relative: 1 %
Eosinophils Absolute: 0.2 10*3/uL (ref 0.0–0.5)
Eosinophils Relative: 4 %
HCT: 36.5 % (ref 36.0–46.0)
Hemoglobin: 12 g/dL (ref 12.0–15.0)
Immature Granulocytes: 0 %
Lymphocytes Relative: 27 %
Lymphs Abs: 1.5 10*3/uL (ref 0.7–4.0)
MCH: 32.7 pg (ref 26.0–34.0)
MCHC: 32.9 g/dL (ref 30.0–36.0)
MCV: 99.5 fL (ref 80.0–100.0)
Monocytes Absolute: 0.5 10*3/uL (ref 0.1–1.0)
Monocytes Relative: 9 %
Neutro Abs: 3.2 10*3/uL (ref 1.7–7.7)
Neutrophils Relative %: 59 %
Platelets: 314 10*3/uL (ref 150–400)
RBC: 3.67 MIL/uL — ABNORMAL LOW (ref 3.87–5.11)
RDW: 12 % (ref 11.5–15.5)
WBC: 5.4 10*3/uL (ref 4.0–10.5)
nRBC: 0 % (ref 0.0–0.2)

## 2018-08-26 LAB — COMPREHENSIVE METABOLIC PANEL
ALT: 11 U/L (ref 0–44)
AST: 16 U/L (ref 15–41)
Albumin: 4 g/dL (ref 3.5–5.0)
Alkaline Phosphatase: 123 U/L (ref 38–126)
Anion gap: 10 (ref 5–15)
BUN: 15 mg/dL (ref 8–23)
CO2: 26 mmol/L (ref 22–32)
Calcium: 8.9 mg/dL (ref 8.9–10.3)
Chloride: 104 mmol/L (ref 98–111)
Creatinine, Ser: 0.9 mg/dL (ref 0.44–1.00)
GFR calc Af Amer: >60 mL/min (ref >60)
GFR calc non Af Amer: >60 mL/min (ref >60)
Glucose, Bld: 87 mg/dL (ref 70–99)
Potassium: 4.3 mmol/L (ref 3.5–5.1)
Sodium: 140 mmol/L (ref 135–145)
Total Bilirubin: 0.8 mg/dL (ref 0.3–1.2)
Total Protein: 6.6 g/dL (ref 6.5–8.1)

## 2018-08-26 MED ORDER — ANASTROZOLE 1 MG PO TABS: 1.0000 mg | ORAL_TABLET | Freq: Every day | ORAL | 4 refills | Status: DC

## 2018-12-27 ENCOUNTER — Ambulatory Visit
Admission: RE | Admit: 2018-12-27 | Discharge: 2018-12-27 | Disposition: A | Discharge status: Home / Self Care | Payer: Federal, State, Local not specified - PPO | Source: Ambulatory Visit | Attending: Oncology | Admitting: Oncology

## 2018-12-27 ENCOUNTER — Other Ambulatory Visit: Payer: Self-pay

## 2018-12-27 DIAGNOSIS — C50411 Malignant neoplasm of upper-outer quadrant of right female breast: Secondary | ICD-10-CM

## 2018-12-27 DIAGNOSIS — Z170 Estrogen receptor positive status [ER+]: Secondary | ICD-10-CM

## 2018-12-28 ENCOUNTER — Telehealth: Payer: Self-pay | Admitting: *Deleted

## 2018-12-28 NOTE — Telephone Encounter (Signed)
Per Wilber Bihari NP, called and made pt aware that her bone density showed osteopenia. Recommended that she starts calcium and vitamin D as well as doing weight bearing exercises. Pt verbalized understanding.

## 2019-03-28 IMAGING — MG MM PLC BREAST LOC DEV 1ST LESION INC*R*
8 of 10 series · 8 of 10 positions shown · non-contrast
Comparison: Previous exam(s).

CLINICAL DATA: Right breast cancer for radio active seed
localization prior to surgery.

EXAM:
MAMMOGRAPHIC GUIDED RADIOACTIVE SEED LOCALIZATION OF THE RIGHT
BREAST

[R CC (1 of 4)]
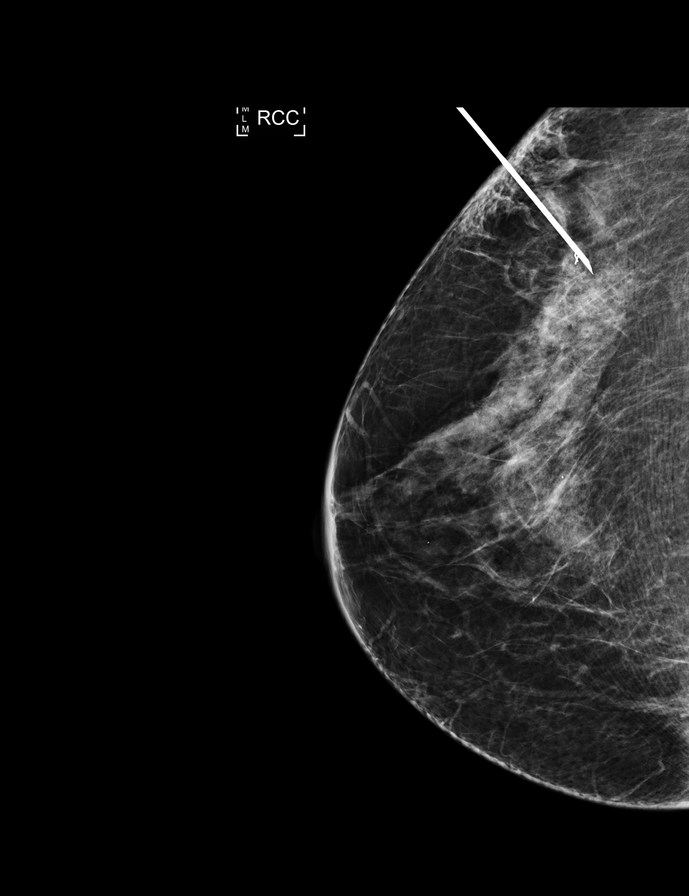

[R CC (2 of 4)]
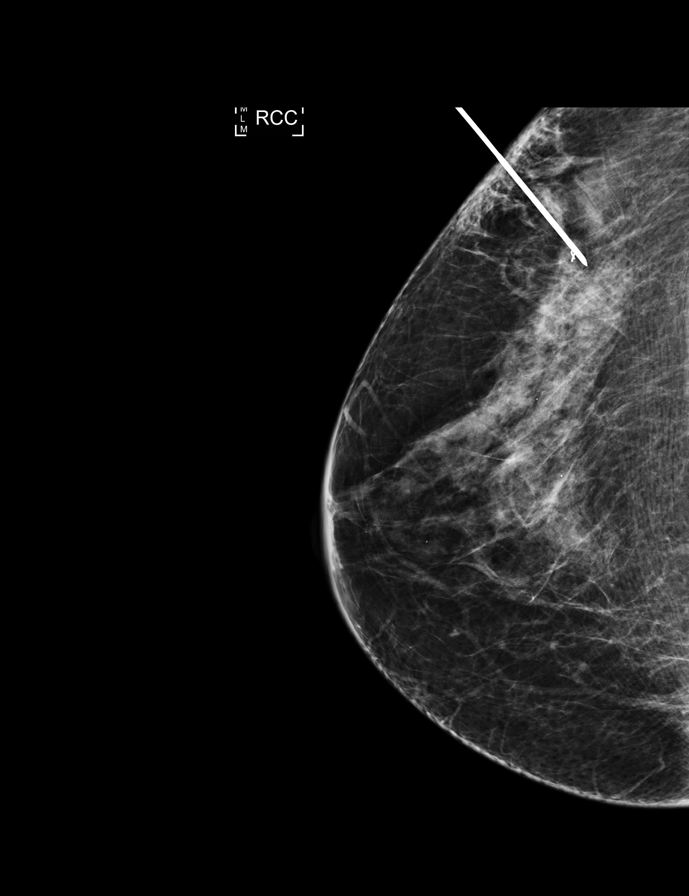

[R CC (3 of 4)]
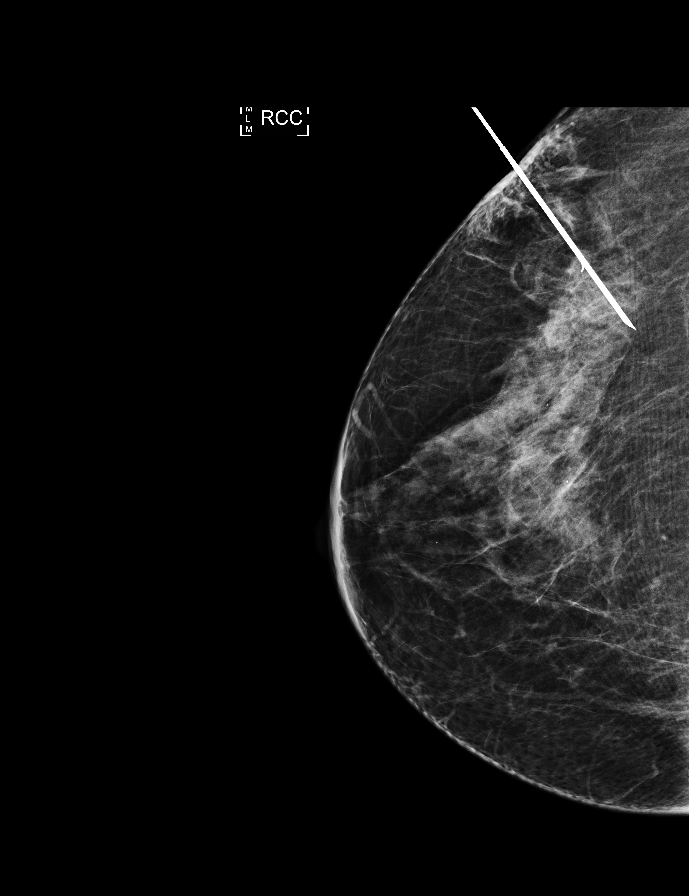

[R LM (1 of 4)]
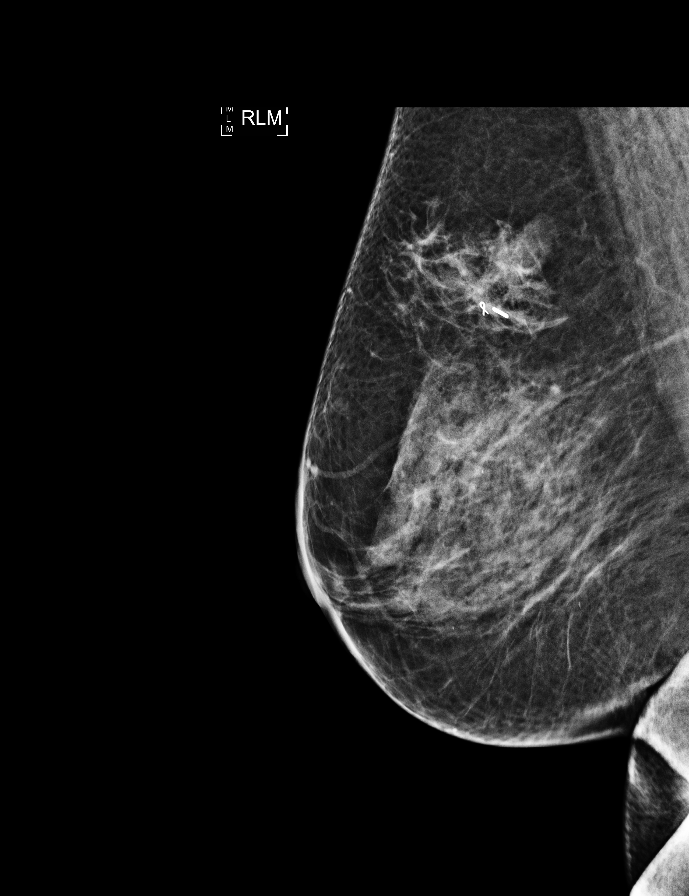

[R LM (2 of 4)]
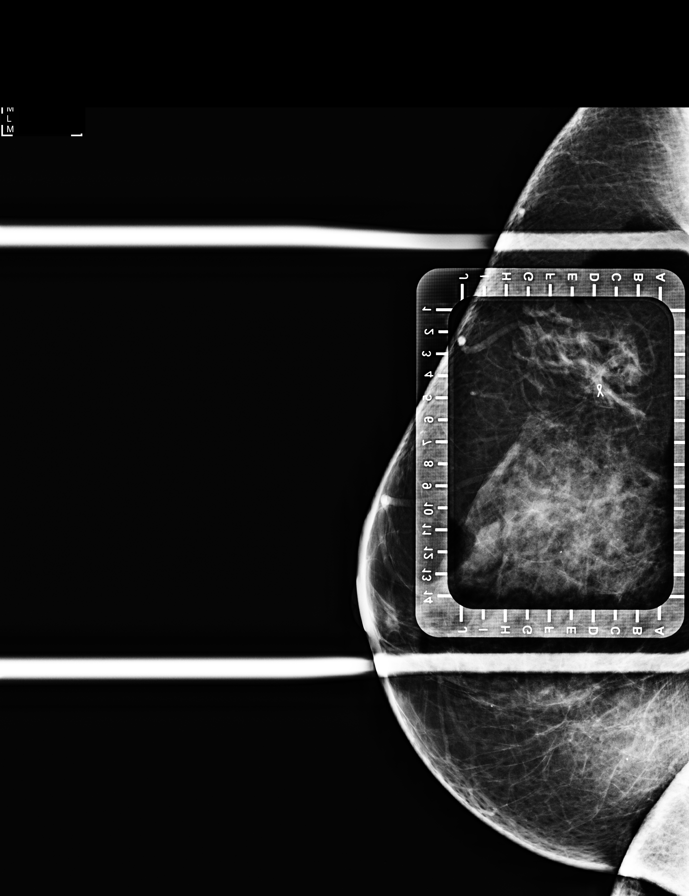

[R LM (3 of 4)]
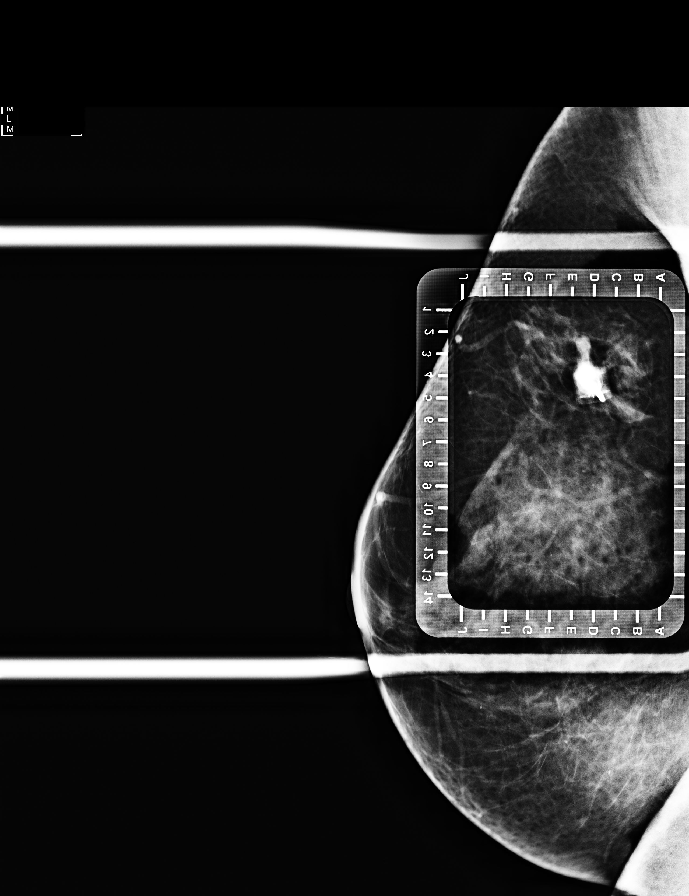

[R CC (4 of 4)]
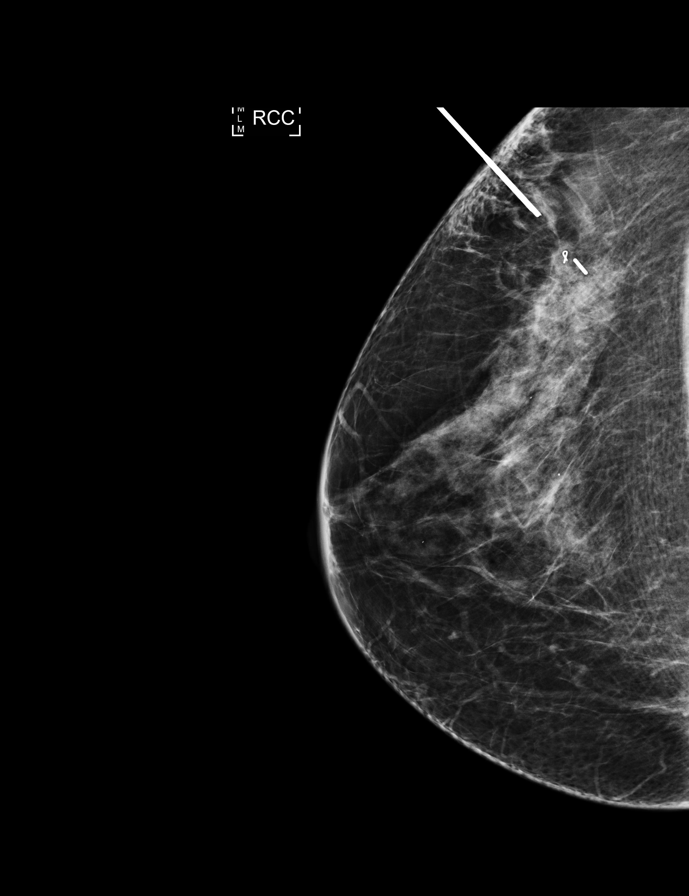

[R LM (4 of 4)]
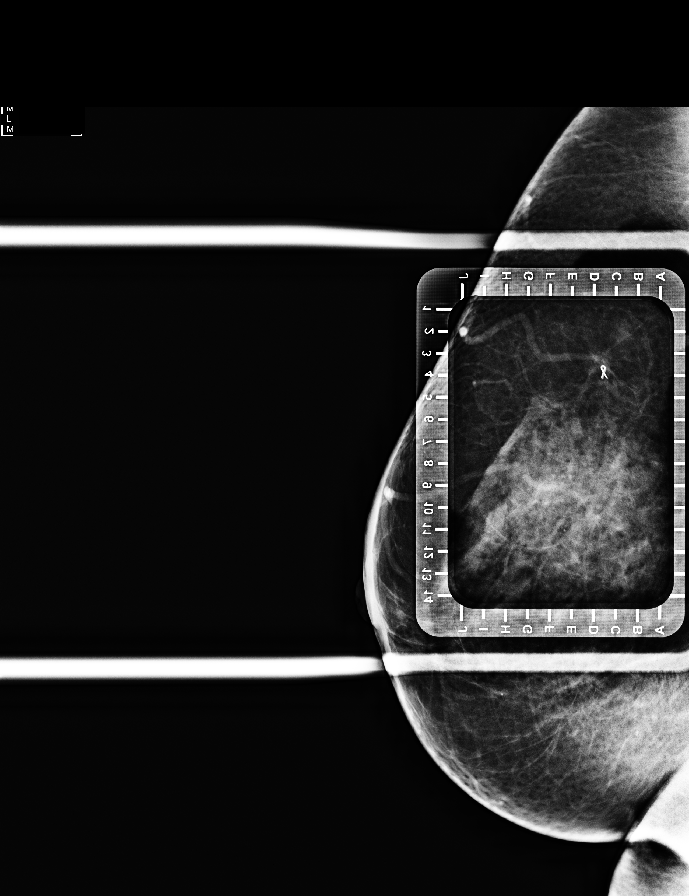

[8 of 10 positions shown; findings below may reference images not displayed]

FINDINGS: Patient presents for radioactive seed localization prior to right
breast surgery. I met with the patient and we discussed the
procedure of seed localization including benefits and alternatives.
We discussed the high likelihood of a successful procedure. We
discussed the risks of the procedure including infection, bleeding,
tissue injury and further surgery. We discussed the low dose of
radioactivity involved in the procedure. Informed, written consent
was given.

The usual time-out protocol was performed immediately prior to the
procedure.

Using mammographic guidance, sterile technique, 1% lidocaine and an
P-WRO radioactive seed, ribbon biopsy clip in the upper-outer
quadrant right breast was localized using a lateral approach. The
follow-up mammogram images confirm the seed in the expected location
and were marked for Dr. Artiomas.

Follow-up survey of the patient confirms presence of the radioactive
seed.

Order number of P-WRO seed:  [id number].

Total activity:  0.255 millicuries  Reference Date: July 24, 2016

The patient tolerated the procedure well and was released from the
[REDACTED]. She was given instructions regarding seed removal.
IMPRESSION: Radioactive seed localization right breast. No apparent
complications.

## 2019-04-02 IMAGING — MG BREAST SURGICAL SPECIMEN
1 series · 1 of 1 positions shown · non-contrast
Comparison: Previous exam(s).

CLINICAL DATA: Status post lumpectomy today after earlier
radioactive seed localization.

EXAM:
SPECIMEN RADIOGRAPH OF THE RIGHT BREAST

[R]
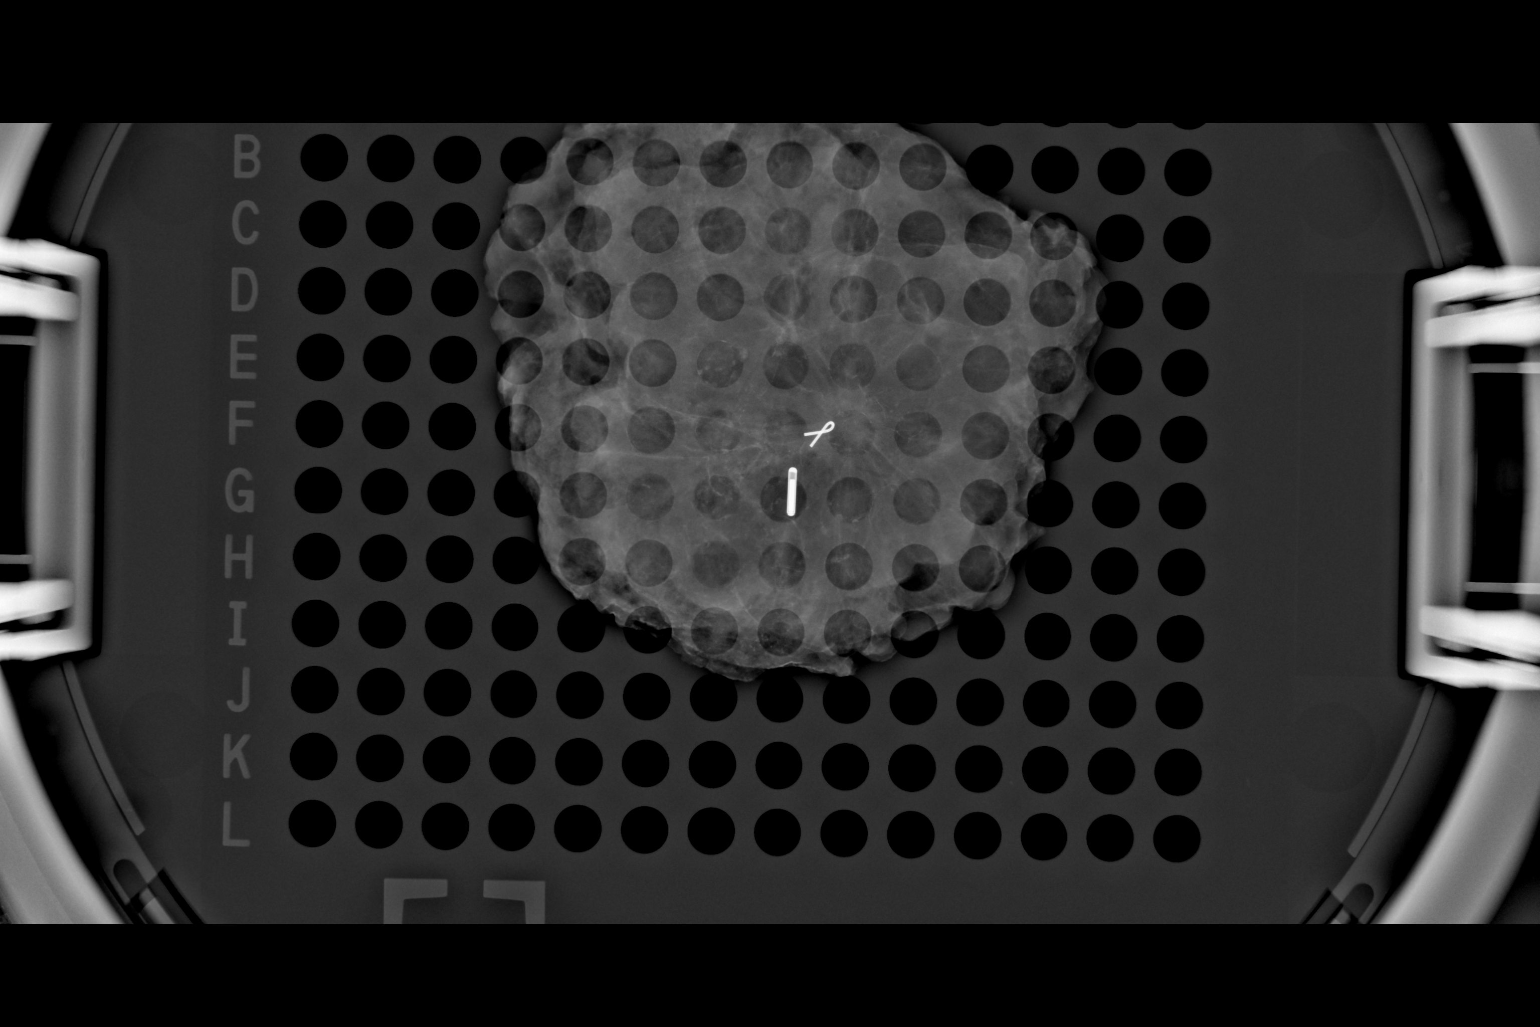

[1 of 1 positions shown; findings below may reference images not displayed]

FINDINGS: Status post excision of the right breast. The radioactive seed and
biopsy marker clip are present, completely intact, and were marked
for pathology. The positions of the radioactive seed and biopsy
marker clip within the specimen were discussed with the OR staff
during the procedure.
IMPRESSION: Specimen radiograph of the right breast.

## 2019-06-01 ENCOUNTER — Telehealth: Payer: Self-pay | Admitting: Internal Medicine

## 2019-06-01 NOTE — Telephone Encounter (Signed)
-----   Message from Earvin Hansen, LPN sent at 1/50/4136  4:50 PM EDT ----- Eugene Garnet Mrs Milillo needs new patient appointment with Dr Debara Pickett, ok per him. If you could please reach out to her that would be great Thanks Melinda

## 2019-06-01 NOTE — Telephone Encounter (Signed)
Called patient to schedule NP appt with Dr. Debara Pickett.

## 2019-06-14 NOTE — Telephone Encounter (Signed)
LMTCB to schedule NP appt

## 2019-07-28 ENCOUNTER — Telehealth: Payer: Self-pay | Admitting: Internal Medicine

## 2019-07-28 NOTE — Telephone Encounter (Signed)
°  Husband is calling because they found out this morning that his wife and himself have been in contact with someone that has tested positive for covid. He states they have both been fully vaccinated back in March and he would like to know if she can keep her appt with Dr Debara Pickett on 08/02/19 or do they need to reschedule?

## 2019-07-29 NOTE — Telephone Encounter (Signed)
Appreciate her notifying me of this - if she is asymptomatic (and afebrile) then ok to come to the appt.   Dr. Debara Pickett

## 2019-07-29 NOTE — Telephone Encounter (Signed)
PT AND PT'S HUSBAND AWARE AT THIS TIME NO FEVER OR SYMPTOMS. WILL KEEP APPT FOR NEXT WEEK  .Adonis Housekeeper

## 2019-08-02 ENCOUNTER — Other Ambulatory Visit: Payer: Self-pay

## 2019-08-02 ENCOUNTER — Encounter: Payer: Self-pay | Admitting: Internal Medicine

## 2019-08-02 ENCOUNTER — Ambulatory Visit: Payer: Federal, State, Local not specified - PPO | Admitting: Internal Medicine

## 2019-08-02 VITALS — BP 130/81 | HR 72 | Temp 97.1°F | Ht 66.0 in | Wt 154.6 lb

## 2019-08-02 DIAGNOSIS — R0789 Other chest pain: Secondary | ICD-10-CM | POA: Diagnosis not present

## 2019-08-02 DIAGNOSIS — R072 Precordial pain: Secondary | ICD-10-CM

## 2019-08-02 DIAGNOSIS — R0602 Shortness of breath: Secondary | ICD-10-CM

## 2019-08-02 DIAGNOSIS — E7801 Familial hypercholesterolemia: Secondary | ICD-10-CM

## 2019-08-02 DIAGNOSIS — Z01812 Encounter for preprocedural laboratory examination: Secondary | ICD-10-CM

## 2019-08-02 MED ORDER — METOPROLOL TARTRATE 100 MG PO TABS: ORAL_TABLET | ORAL | 0 refills | Status: DC

## 2019-08-02 NOTE — Patient Instructions (Addendum)
Medication Instructions:  Dr. Debara Pickett recommends Repatha or Praluent (PCSK9). This is an injectable cholesterol medication self-administered once every 14 days. This medication will likely need prior approval with your insurance company, which we will work on. If the medication is not approved initially, we may need to do an appeal with your insurance. We will keep you updated on this process.   Administer medication in area of fatty tissue such as abdomen, outer thigh, back up of arm - and rotate site with each injection Store medication in refrigerator until ready to administer - allow to sit at room temp for 30 mins - 1 hour prior to injection Dispose of medication in a SHARPS container - your pharmacy should be able to direct you on this and proper disposal   If you need co-pay assistance grant, please look into the program at healthwellfoundation.org >> disease funds >> hypercholesterolemia. This is an online application or you can call to complete. Once approved, you will provide the "pharmacy card" information to your pharmacy and they will deduct the co-pays from this grant.   *If you need a refill on your cardiac medications before your next appointment, please call your pharmacy*   Lab Work: BMET - to be completed 1 week before CT test  FASTING lipid panel in 3-4 months to check cholesterol   If you have labs (blood work) drawn today and your tests are completely normal, you will receive your results only by: Marland Kitchen MyChart Message (if you have MyChart) OR . A paper copy in the mail If you have any lab test that is abnormal or we need to change your treatment, we will call you to review the results.   Testing/Procedures: Dr. Debara Pickett has recommended that you have a Coronary CT test. This will be scheduled once approved with insurance. The test is done at [facility].  Dr. Debara Pickett has recommended that you have an echocardiogram. This will be scheduled at [place]: At [facility], you and your health needs are our priority.  As part of our continuing mission to provide you with exceptional heart care, we have created designated Provider Care Teams.  These Care Teams include your primary Cardiologist (physician) and Advanced Practice Providers (APPs -  Physician Assistants and Nurse Practitioners) who all work together to provide you with the care you need, when you need it.  We recommend signing up for the patient portal called "MyChart".  Sign up information is provided on this After Visit Summary.  MyChart is used to connect with patients for Virtual Visits (Telemedicine).  Patients are able to view lab/test results, encounter notes, upcoming appointments, etc.  Non-urgent messages can be sent to your provider as well.   To learn more about what you can do with MyChart, go to [email or web address].    Your next appointment:   6-8 week(s)  The format for your next appointment:   In Person  Provider:   You may see Dr. Debara Pickett or one of the following Advanced Practice Providers on your designated Care Team:    Almyra Deforest, PA-C  Fabian Sharp, [place] or   Roby Lofts, [place]    Other Instructions  Your cardiac CT will be scheduled at one of the below locations:   [facility] [address] [place], [place] [address] (336) [place] [address] [place],  [address] [phone]  If scheduled at [facility], please arrive  at the [facility] main entrance of [facility] 30 minutes prior to test start time. Proceed to the [facility] Radiology Department (first floor) to check-in and test prep.  If scheduled at [facility], please arrive 15 mins early for check-in and test prep.  Please follow these instructions carefully (unless otherwise directed):  Hold all erectile dysfunction medications at least 3 days (72 hrs) prior to  test.  On the Night Before the Test: . Be sure to Drink plenty of water. . Do not consume any caffeinated/decaffeinated beverages or chocolate 12 hours prior to your test. . Do not take any antihistamines 12 hours prior to your test.  On the Day of the Test: . Drink plenty of water. Do not drink any water within one hour of the test. . Do not eat any food 4 hours prior to the test. . You may take your regular medications prior to the test.  . Take metoprolol (Lopressor) two hours prior to test. . FEMALES- please wear underwire-free bra if available       After the Test: . Drink plenty of water. . After receiving IV contrast, you may experience a mild flushed feeling. This is normal. . On occasion, you may experience a mild rash up to 24 hours after the test. This is not dangerous. If this occurs, you can take Benadryl 25 mg and increase your fluid intake. . If you experience trouble breathing, this can be serious. If it is severe call 911 IMMEDIATELY. If it is mild, please call our office. . If you take any of these medications: Glipizide/Metformin, Avandament, Glucavance, please do not take 48 hours after completing test unless otherwise instructed.   Once we have confirmed authorization from your insurance company, we will call you to set up a date and time for your test. Based on how quickly your insurance processes prior authorizations requests, please allow up to 4 weeks to be contacted for scheduling your Cardiac CT appointment. Be advised that routine Cardiac CT appointments could be scheduled as many as 8 weeks after your provider has ordered it.  For non-scheduling related questions, please contact the cardiac imaging nurse navigator should you have any questions/concerns: Marchia Bond, Cardiac Imaging Nurse Navigator Burley Saver, Interim Cardiac Imaging Nurse [place] [facility] Direct Office Dial: [phone]   For scheduling needs, including  cancellations and rescheduling, please call Vivien Rota at [phone], option 3.

## 2019-08-03 ENCOUNTER — Encounter: Payer: Self-pay | Admitting: Internal Medicine

## 2019-08-03 ENCOUNTER — Telehealth: Payer: Self-pay | Admitting: Internal Medicine

## 2019-08-03 NOTE — Telephone Encounter (Signed)
PA for repatha sureclick submitted via CMM (Key: [id number]) BCBS-[id number] -- [facility]

## 2019-08-03 NOTE — Progress Notes (Addendum)
OFFICE CONSULT NOTE  Chief Complaint:  Shortness of breath, chest discomfort  Primary Care Physician: Manon Hilding, MD  HPI:  Holly Best is a 69 y.o. female who is being seen today for the evaluation of shortness of breath and chest discomfort at the request of Sasser, Silvestre Moment, MD.  This is a pleasant 69 year old female whose husband is a patient of mine.  Over the past year or so she has been experiencing more frequent shortness of breath and chest discomfort which sometimes radiates to her neck.  She also feels intermittent palpitations.  She reports a strong family history of heart disease in fact she had three brothers who had stroke and a father who had heart disease as well.  She reports longstanding dyslipidemia but can only tolerate low-dose atorvastatin 5 mg three times a week due to myalgias.  More recently her lipid profile in July 2021 showed total cholesterol of 239, HDL of 60, triglycerides 145 and LDL of 180.  She has a history of breast cancer and is on Arimidex.  Also has chronic pain for which she takes oxycodone and some anxiety on Paxil and lorazepam.  She also takes low-dose aspirin.  She describes shortness of breath with exertion, which is worse walking up hills or doing certain activities with her horses.  She has been getting more discomfort which feels like a tightness or squeezing in her chest.  Sometimes this is at rest and other times that exertion.  PMHx:  Past Medical History:  Diagnosis Date  . Cancer [facility])    right breast cancer  . Crohn disease ([place])    back in 1991, [place] in 2002 and no longer has problems with this  . Depression   . Family history of breast cancer   . History of radiation therapy 09/10/16- 10/08/16   Right Breast 2.67 Gy in 15 fractions, Right Breast boost, 2 Gy in 5 fractions.   . Hypercholesteremia   . Personal history of radiation therapy     Past Surgical History:  Procedure Laterality Date  . BREAST LUMPECTOMY Right  2018  . BREAST LUMPECTOMY WITH RADIOACTIVE SEED AND SENTINEL LYMPH NODE BIOPSY Right 08/06/2016   Procedure: BREAST LUMPECTOMY WITH RADIOACTIVE SEED AND SENTINEL LYMPH NODE BIOPSY;  Surgeon: Jovita Kussmaul, MD;  Location: [place];  Service: General;  Laterality: Right;  . COLONOSCOPY    . dental implants     upper  . LUMBAR LAMINECTOMY Bilateral 03/23/2013   Procedure: MICRO LUMBAR DECOMPRESSION, FORAMINOTOMY L4-5;  Surgeon: Johnn Hai, MD;  [facility];  Service: Orthopedics;  Laterality: Bilateral;  . POSTERIOR LUMBAR FUSION  10/16/2017    Posterior Lumbar Interbody Fusion - Lumbar Two-Lumbar Three -     FAMHx:  Family History  Problem Relation Age of Onset  . Breast cancer Sister 44  . Breast cancer Other 55  . Lung cancer Mother   . Lung cancer Father   . Breast cancer Maternal Grandmother        dx in her 18s  . Other Brother        non-malignant spinal tumor  . Breast cancer Maternal Aunt        dx > 50  . Lupus Maternal Aunt   . Breast cancer Maternal Aunt        dx >50    SOCHx:   reports that she quit smoking about 41 years ago. Her smoking use included cigarettes. She has a 10.00 pack-year smoking  history. She has never used smokeless tobacco. She reports current alcohol use. She reports that she does not use drugs.  ALLERGIES:  No Known Allergies  ROS: Pertinent items noted in HPI and remainder of comprehensive ROS otherwise negative.  HOME MEDS: Current Outpatient Medications on File Prior to Visit  Medication Sig Dispense Refill  . anastrozole (ARIMIDEX) 1 MG tablet Take 1 tablet (1 mg total) by mouth daily. 90 tablet 4  . aspirin EC 81 MG tablet Take 81 mg by mouth at bedtime.     Marland Kitchen atorvastatin (LIPITOR) 10 MG tablet Take 5 mg by mouth every Monday, Wednesday, and Friday. In the evening  3  . calcium carbonate (TUMS - DOSED IN MG ELEMENTAL CALCIUM) 500 MG chewable tablet Chew 500 mg by mouth at bedtime.     . Cholecalciferol (VITAMIN D3) 2000 units  TABS Take 2,000 Units by mouth daily.    Marland Kitchen gabapentin (NEURONTIN) 100 MG capsule Take 100 mg by mouth 2 (two) times daily.    Marland Kitchen LORazepam (ATIVAN) 1 MG tablet Take 1 mg by mouth at bedtime.    Marland Kitchen oxyCODONE-acetaminophen (PERCOCET/ROXICET) 5-325 MG tablet Take 1 tablet by mouth every 6 (six) hours as needed for severe pain.    Marland Kitchen PARoxetine (PAXIL) 20 MG tablet Take 20 mg by mouth every evening.     No current facility-administered medications on file prior to visit.    LABS/IMAGING: No results found for this or any previous visit (from the past 48 hour(s)). No results found.  LIPID PANEL: No results found for: CHOL, TRIG, HDL, CHOLHDL, VLDL, LDLCALC, LDLDIRECT  WEIGHTS: Wt Readings from Last 3 Encounters:  08/02/19 154 lb 9.6 oz (70.1 kg)  08/26/18 156 lb 14.4 oz (71.2 kg)  02/02/18 165 lb 1.6 oz (74.9 kg)    VITALS: BP (!) 130/81   Pulse 72   Temp (!) 97.1 F (36.2 C)   Ht 5' 6"  (1.676 m)   Wt 154 lb 9.6 oz (70.1 kg)   SpO2 95%   BMI 24.95 kg/m   EXAM: General appearance: alert and no distress Neck: no carotid bruit, no JVD and thyroid not enlarged, symmetric, no tenderness/mass/nodules Lungs: clear to auscultation bilaterally Heart: regular rate and rhythm Abdomen: soft, non-tender; bowel sounds normal; no masses,  no organomegaly Extremities: extremities normal, atraumatic, no cyanosis or edema Pulses: 2+ and symmetric Skin: Skin color, texture, turgor normal. No rashes or lesions Neurologic: Grossly normal Psych: Pleasant  EKG: Sinus rhythm with PACs at 72- personally reviewed  ASSESSMENT: 1. Progressive dyspnea and chest pressure 2. Strong family history of cardiovascular disease 3. 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 Relative statin intolerance-myalgias 5. History of right breast cancer with radiation/chemotherapy  PLAN: 1.   Holly Best has been describing progressive dyspnea and chest pressure with a strong  family history of stroke and cardiovascular disease in her brothers and father.  I suspect she has a familial hyperlipidemia with a treated LDL of 180 on low-dose atorvastatin, suggesting off treatment LDL is likely greater than 190.  She cannot tolerate higher doses of statin however LDL is in the Eva range.  I think she is a good candidate for PCSK9 inhibitor and would recommend Repatha 140 mg every 2 weeks.  With regard to progressive dyspnea and chest pressure, I would like to get a CT coronary angiogram with FFR to evaluate for any obstructive coronary disease given her marked dyslipidemia.  Finally we will get an echocardiogram to look for  any structural heart disease given her history of breast cancer with radiation and chemotherapy.  Follow-up with me after the studies.  Pixie Casino, MD, [facility], [place] Director of the Advanced Lipid Disorders &  Cardiovascular Risk Reduction Clinic Diplomate of the American Board of Clinical Lipidology Attending Cardiologist  Direct Dial: [phone]  Fax: [phone]  Website:  [email or web address]  Nadean Corwin Ashwin Tibbs 08/03/2019, 8:26 AM

## 2019-08-05 NOTE — Telephone Encounter (Signed)
Faxed paperwork from insurance company received. Need MD to review note concerning LDL so this can be faxed

## 2019-08-09 ENCOUNTER — Other Ambulatory Visit: Payer: Self-pay

## 2019-08-09 ENCOUNTER — Ambulatory Visit (HOSPITAL_COMMUNITY)
Admission: RE | Admit: 2019-08-09 | Discharge: 2019-08-09 | Disposition: A | Discharge status: Home / Self Care | Payer: Federal, State, Local not specified - PPO | Source: Ambulatory Visit | Attending: Internal Medicine | Admitting: Internal Medicine

## 2019-08-09 DIAGNOSIS — R0602 Shortness of breath: Secondary | ICD-10-CM | POA: Insufficient documentation

## 2019-08-09 LAB — ECHOCARDIOGRAM COMPLETE
AR max vel: 1.68 cm2
AV Area VTI: 1.55 cm2
AV Area mean vel: 1.62 cm2
AV Mean grad: 11 mmHg
AV Peak grad: 20.3 mmHg
Ao pk vel: 2.25 m/s
Area-P 1/2: 3.43 cm2
MV M vel: 5.29 m/s
MV Peak grad: 111.9 mmHg
P 1/2 time: 427 msec
S' Lateral: 2.42 cm

## 2019-08-09 NOTE — Progress Notes (Signed)
*  PRELIMINARY RESULTS* Echocardiogram 2D Echocardiogram has been performed.  Samuel Germany 08/09/2019, 11:15 AM

## 2019-08-15 ENCOUNTER — Encounter: Payer: Self-pay | Admitting: Cardiovascular Disease

## 2019-08-15 ENCOUNTER — Telehealth: Payer: Self-pay | Admitting: Internal Medicine

## 2019-08-15 NOTE — Telephone Encounter (Signed)
Croitoru, Dani Gobble, MD  Fidel Levy, RN Overall good news. Normal heart pumping function. Some age related stiffening of the aortic valve, but not causing any significant obstruction to blood flow at this point (may cause a heart murmur).  No cardiac cause for shortness of breath is identified.   Patient called w/results Coronary CT test to be scheduled

## 2019-08-15 NOTE — Telephone Encounter (Signed)
error 

## 2019-08-15 NOTE — Telephone Encounter (Signed)
Patient returning call for echo results. 

## 2019-08-17 NOTE — Telephone Encounter (Signed)
PA form faxed to [place] @ [phone]

## 2019-08-31 LAB — BASIC METABOLIC PANEL
BUN/Creatinine Ratio: 21 (ref 12–28)
BUN: 16 mg/dL (ref 8–27)
CO2: 24 mmol/L (ref 20–29)
Calcium: 9.3 mg/dL (ref 8.7–10.3)
Chloride: 104 mmol/L (ref 96–106)
Creatinine, Ser: 0.75 mg/dL (ref 0.57–1.00)
GFR calc Af Amer: 94 mL/min/{1.73_m2} (ref >59)
GFR calc non Af Amer: 82 mL/min/{1.73_m2} (ref >59)
Glucose: 97 mg/dL (ref 65–99)
Potassium: 4.3 mmol/L (ref 3.5–5.2)
Sodium: 140 mmol/L (ref 134–144)

## 2019-09-02 ENCOUNTER — Telehealth (HOSPITAL_COMMUNITY): Payer: Self-pay | Admitting: Emergency Medicine

## 2019-09-02 NOTE — Telephone Encounter (Signed)
Reaching out to patient to offer assistance regarding upcoming cardiac imaging study; pt verbalizes understanding of appt date/time, parking situation and where to check in, pre-test NPO status and medications ordered, and verified current allergies; name and call back number provided for further questions should they arise Dyke Weible RN Navigator Cardiac Imaging [facility] [phone] office [phone] cell 

## 2019-09-05 ENCOUNTER — Other Ambulatory Visit: Payer: Self-pay

## 2019-09-05 ENCOUNTER — Ambulatory Visit (HOSPITAL_COMMUNITY)
Admission: RE | Admit: 2019-09-05 | Discharge: 2019-09-05 | Disposition: A | Discharge status: Home / Self Care | Payer: Federal, State, Local not specified - PPO | Source: Ambulatory Visit | Attending: Internal Medicine | Admitting: Internal Medicine

## 2019-09-05 DIAGNOSIS — R072 Precordial pain: Secondary | ICD-10-CM | POA: Insufficient documentation

## 2019-09-05 MED ORDER — IOHEXOL 350 MG/ML SOLN
80.0000 mL | Freq: Once | INTRAVENOUS | Status: AC | PRN
  Administered 2019-09-05: 80 mL via INTRAVENOUS

## 2019-09-05 MED ORDER — NITROGLYCERIN 0.4 MG SL SUBL
SUBLINGUAL_TABLET | SUBLINGUAL | Status: AC
  Filled 2019-09-05: qty 2

## 2019-09-05 MED ORDER — NITROGLYCERIN 0.4 MG SL SUBL
0.8000 mg | SUBLINGUAL_TABLET | Freq: Once | SUBLINGUAL | Status: AC
  Administered 2019-09-05: 0.8 mg via SUBLINGUAL

## 2019-09-05 NOTE — Progress Notes (Signed)
CT scan completed. Tolerated well. D/C home ambulatory with husband. Awake and alert. In no distress.

## 2019-09-19 NOTE — Telephone Encounter (Signed)
From 9/9:  LM for [employer] FEP to return call with update on PA status or fax any info to our office. Was unable to reach live representative at time of call

## 2019-09-19 NOTE — Telephone Encounter (Signed)
Faxed MD note, coronary CT report to [facility] FEP @ [phone]

## 2019-09-22 ENCOUNTER — Ambulatory Visit: Payer: Federal, State, Local not specified - PPO | Admitting: Internal Medicine

## 2019-09-22 MED ORDER — REPATHA SURECLICK 140 MG/ML ~~LOC~~ SOAJ: 1.0000 | SUBCUTANEOUS | 11 refills | Status: DC

## 2019-09-22 NOTE — Addendum Note (Signed)
Addended by: Fidel Levy on: 09/22/2019 03:46 PM   Modules accepted: Orders

## 2019-09-22 NOTE — Telephone Encounter (Signed)
Repatha has been approved from 08/23/2019 - 09/21/2020

## 2019-09-22 NOTE — Telephone Encounter (Signed)
Rx(s) sent to pharmacy electronically. Left message for patient.

## 2019-10-03 ENCOUNTER — Encounter: Payer: Self-pay | Admitting: Internal Medicine

## 2019-10-03 ENCOUNTER — Other Ambulatory Visit: Payer: Self-pay

## 2019-10-03 ENCOUNTER — Ambulatory Visit (INDEPENDENT_AMBULATORY_CARE_PROVIDER_SITE_OTHER): Payer: Federal, State, Local not specified - PPO | Admitting: Internal Medicine

## 2019-10-03 VITALS — BP 138/80 | HR 81 | Ht 66.0 in | Wt 150.6 lb

## 2019-10-03 DIAGNOSIS — R0789 Other chest pain: Secondary | ICD-10-CM | POA: Diagnosis not present

## 2019-10-03 DIAGNOSIS — E7801 Familial hypercholesterolemia: Secondary | ICD-10-CM | POA: Diagnosis not present

## 2019-10-03 DIAGNOSIS — I272 Pulmonary hypertension, unspecified: Secondary | ICD-10-CM | POA: Diagnosis not present

## 2019-10-03 DIAGNOSIS — R0602 Shortness of breath: Secondary | ICD-10-CM

## 2019-10-03 MED ORDER — REPATHA SURECLICK 140 MG/ML ~~LOC~~ SOAJ: 1.0000 | SUBCUTANEOUS | 11 refills | Status: DC

## 2019-10-03 MED ORDER — AMLODIPINE BESYLATE 2.5 MG PO TABS: 2.5000 mg | ORAL_TABLET | Freq: Every day | ORAL | 3 refills | Status: DC

## 2019-10-03 NOTE — Patient Instructions (Signed)
Medication Instructions:  START amlodipine 2.70m daily Continue all other current medications  *If you need a refill on your cardiac medications before your next appointment, please call your pharmacy*   Lab Work: FASTING lipid panel in December 2021 at any [facility]  If you have labs (blood work) drawn today and your tests are completely normal, you will receive your results only by:  [employer]on(if you have MyChart) OR  A paper copy in the mail If you have any lab test that is abnormal or we need to change your treatment, we will call you to review the results.   Testing/Procedures: NONE   Follow-Up: [facility] and your [facility] needs are our [facility].  As part of our continuing mission to provide you with exceptional heart care, we have created designated Provider Care Teams.  These Care Teams include your primary Cardiologist (physician) and Advanced Practice Providers (APPs -  Physician Assistants and Nurse Practitioners) who all work together to provide you with the care you need, when you need it.  We recommend signing up for the patient portal called "MyChart".  Sign up information is provided on this After Visit Summary.  MyChart is used to connect with patients for Virtual Visits (Telemedicine).  Patients are able to view lab/test results, encounter notes, upcoming appointments, etc.  Non-urgent messages can be sent to your provider as well.   To learn more about what you can do with MyChart, go to [email or web address]    Your next appointment:   6 month(s)  The format for your next appointment:   In Person  Provider:   You may see Dr. HDebara Pickettor one of the following Advanced Practice Providers on your designated Care Team:    HAlmyra Deforest PA-C  AFabian Sharp PVermontor   KRoby Lofts PV[place]ont   Other Instructions

## 2019-10-03 NOTE — Progress Notes (Signed)
OFFICE CONSULT NOTE  Chief Complaint:  Follow-up shortness of breath, chest discomfort  Primary Care Physician: Manon Hilding, MD  HPI:  Holly Best is a 69 y.o. female who is being seen today for the evaluation of shortness of breath and chest discomfort at the request of Sasser, Silvestre Moment, MD.  This is a pleasant 69 year old female whose husband is a patient of mine.  Over the past year or so she has been experiencing more frequent shortness of breath and chest discomfort which sometimes radiates to her neck.  She also feels intermittent palpitations.  She reports a strong family history of heart disease in fact she had three brothers who had stroke and a father who had heart disease as well.  She reports longstanding dyslipidemia but can only tolerate low-dose atorvastatin 5 mg three times a week due to myalgias.  More recently her lipid profile in July 2021 showed total cholesterol of 239, HDL of 60, triglycerides 145 and LDL of 180.  She has a history of breast cancer and is on Arimidex.  Also has chronic pain for which she takes oxycodone and some anxiety on Paxil and lorazepam.  She also takes low-dose aspirin.  She describes shortness of breath with exertion, which is worse walking up hills or doing certain activities with her horses.  She has been getting more discomfort which feels like a tightness or squeezing in her chest.  Sometimes this is at rest and other times that exertion.  10/03/2019  [name] returns today for follow-up of shortness of breath and chest discomfort.  He underwent echocardiography as well as a CT coronary angiogram which I personally reviewed.  The echo showed normal systolic function with mild diastolic dysfunction.  There is likely some very mild aortic stenosis with a mean gradient of 11 mmHg as well as some mild aortic insufficiency.  She had mild left atrial enlargement with trivial mitral vegetation.  Her right atrial pressure was elevated at 8 mmHg  and unfortunate there was not enough tricuspid regurgitation to measure pulmonary pressures however her pulmonary arteries were noted to be dilated on a CT suggestive of pulmonary hypertension.  The CT was reassuring and that there was minimal nonobstructive coronary disease however her calcium score was 65 which was 69th percentile for age and sex matched control.  This certainly does not explain her dyspnea however is target for cardiovascular risk reduction.  Subsequently since she was not tolerant of statins, we have been able to get prior authorization for Repatha and she is already given herself 1 injection.  We will need to reassess her lipid profile in a few months.  PMHx:  Past Medical History:  Diagnosis Date  . Cancer [facility])    right breast cancer  . Crohn disease ([place])    back in 1991, [place] in 2002 and no longer has problems with this  . Depression   . Family history of breast cancer   . History of radiation therapy 09/10/16- 10/08/16   Right Breast 2.67 Gy in 15 fractions, Right Breast boost, 2 Gy in 5 fractions.   . Hypercholesteremia   . Personal history of radiation therapy     Past Surgical History:  Procedure Laterality Date  . BREAST LUMPECTOMY Right 2018  . BREAST LUMPECTOMY WITH RADIOACTIVE SEED AND SENTINEL LYMPH NODE BIOPSY Right 08/06/2016   Procedure: BREAST LUMPECTOMY WITH RADIOACTIVE SEED AND SENTINEL LYMPH NODE BIOPSY;  Surgeon: Jovita Kussmaul, MD;  Location: [place];  Service:  General;  Laterality: Right;  . COLONOSCOPY    . dental implants     upper  . LUMBAR LAMINECTOMY Bilateral 03/23/2013   Procedure: MICRO LUMBAR DECOMPRESSION, FORAMINOTOMY L4-5;  Surgeon: Johnn Hai, MD;  [facility];  Service: Orthopedics;  Laterality: Bilateral;  . POSTERIOR LUMBAR FUSION  10/16/2017    Posterior Lumbar Interbody Fusion - Lumbar Two-Lumbar Three -     FAMHx:  Family History  Problem Relation Age of Onset  . Breast cancer Sister 34  . Breast cancer Other 55   . Lung cancer Mother   . Lung cancer Father   . Breast cancer Maternal Grandmother        dx in her 61s  . Other Brother        non-malignant spinal tumor  . Breast cancer Maternal Aunt        dx > 50  . Lupus Maternal Aunt   . Breast cancer Maternal Aunt        dx >50    SOCHx:   reports that she quit smoking about 41 years ago. Her smoking use included cigarettes. She has a 10.00 pack-year smoking history. She has never used smokeless tobacco. She reports current alcohol use. She reports that she does not use drugs.  ALLERGIES:  No Known Allergies  ROS: Pertinent items noted in HPI and remainder of comprehensive ROS otherwise negative.  HOME MEDS: Current Outpatient Medications on File Prior to Visit  Medication Sig Dispense Refill  . anastrozole (ARIMIDEX) 1 MG tablet Take 1 tablet (1 mg total) by mouth daily. 90 tablet 4  . aspirin EC 81 MG tablet Take 81 mg by mouth at bedtime.     . calcium carbonate (TUMS - DOSED IN MG ELEMENTAL CALCIUM) 500 MG chewable tablet Chew 500 mg by mouth at bedtime.     . Cholecalciferol (VITAMIN D3) 2000 units TABS Take 2,000 Units by mouth daily.    . Evolocumab (REPATHA SURECLICK) 500 MG/ML SOAJ Inject 1 Dose into the skin every 14 (fourteen) days. 2 mL 11  . gabapentin (NEURONTIN) 100 MG capsule Take 100 mg by mouth 2 (two) times daily.    Marland Kitchen LORazepam (ATIVAN) 1 MG tablet Take 1 mg by mouth at bedtime.    . metoprolol tartrate (LOPRESSOR) 100 MG tablet Take ONE tablet TWO HOURS prior to CT test. 1 tablet 0  . oxyCODONE-acetaminophen (PERCOCET/ROXICET) 5-325 MG tablet Take 1 tablet by mouth every 6 (six) hours as needed for severe pain.    Marland Kitchen PARoxetine (PAXIL) 20 MG tablet Take 20 mg by mouth every evening.     No current facility-administered medications on file prior to visit.    LABS/IMAGING: No results found for this or any previous visit (from the past 48 hour(s)). No results found.  LIPID PANEL: No results found for: CHOL, TRIG,  HDL, CHOLHDL, VLDL, LDLCALC, LDLDIRECT  WEIGHTS: Wt Readings from Last 3 Encounters:  10/03/19 150 lb 9.6 oz (68.3 kg)  08/02/19 154 lb 9.6 oz (70.1 kg)  08/26/18 156 lb 14.4 oz (71.2 kg)    VITALS: BP 138/80   Pulse 81   Ht 5' 6"  (1.676 m)   Wt 150 lb 9.6 oz (68.3 kg)   BMI 24.31 kg/m   EXAM: Deferred  EKG: Deferred  ASSESSMENT: 1. Progressive dyspnea and chest pressure - suspect pulmonary hypertension 2. Very mild aortic stenosis with mild aortic insufficiency, normal LVEF (08/2019) 3. 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 Strong family history of cardiovascular disease 5. 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 Relative statin intolerance-myalgias 7. History of right breast cancer with radiation/chemotherapy  PLAN: 1.   Mrs. Boddy was found to have mild nonobstructive coronary disease by coronary CTA and a calcium score 65.  Echo showed very mild aortic stenosis with mild aortic insufficiency and normal LVEF.  Right atrial pressure was elevated however tricuspid insufficiency was minimal and therefore RVSP could not be calculated.  Her pulmonary arteries dilated on the CT suggestive of pulmonary hypertension on I suspect that is more consistent with her symptoms.  She has had prior radiation due to her breast cancer and could be related to that.  I would recommend a trial of low-dose amlodipine to see if she has any reversible pulmonary hypertension.  Blood pressure should allow this.  We will plan repeat lipids in a few months and follow-up with me afterwards.  Pixie Casino, MD, [facility], [place] Director of the Advanced Lipid Disorders &  Cardiovascular Risk Reduction Clinic Diplomate of the American Board of Clinical Lipidology Attending Cardiologist  Direct Dial: [phone]  Fax: [phone]  Website:  [email or web address] 10/03/2019, 3:38 PM

## 2019-10-07 ENCOUNTER — Telehealth: Payer: Self-pay | Admitting: *Deleted

## 2019-10-07 ENCOUNTER — Telehealth: Payer: Self-pay | Admitting: Oncology

## 2019-10-07 NOTE — Telephone Encounter (Signed)
Pt left VM inquiring about when is her next appt.  Per chart review noted last visit was 08/2018 with request to be seen in 1 year.  Pt is on Anastrozole.  This RN called pt - she states she is currently taking the Anastrozole and does not need refills at this time.  Informed her appointment is being requested and she will be called.  Request for follow up sent to scheduling.

## 2019-10-07 NOTE — Telephone Encounter (Signed)
Scheduled appt per 10/1 sch msg - pt aware of appt on 10/15

## 2019-10-21 ENCOUNTER — Inpatient Hospital Stay: Payer: Federal, State, Local not specified - PPO

## 2019-10-21 ENCOUNTER — Inpatient Hospital Stay: Payer: Federal, State, Local not specified - PPO | Attending: Oncology | Admitting: Oncology

## 2019-10-21 ENCOUNTER — Other Ambulatory Visit: Payer: Self-pay

## 2019-10-21 VITALS — BP 143/77 | HR 85 | Temp 98.0°F | Resp 18 | Ht 66.0 in | Wt 149.7 lb

## 2019-10-21 DIAGNOSIS — Z7982 Long term (current) use of aspirin: Secondary | ICD-10-CM | POA: Insufficient documentation

## 2019-10-21 DIAGNOSIS — F329 Major depressive disorder, single episode, unspecified: Secondary | ICD-10-CM | POA: Insufficient documentation

## 2019-10-21 DIAGNOSIS — Z79811 Long term (current) use of aromatase inhibitors: Secondary | ICD-10-CM | POA: Diagnosis not present

## 2019-10-21 DIAGNOSIS — K5090 Crohn's disease, unspecified, without complications: Secondary | ICD-10-CM | POA: Insufficient documentation

## 2019-10-21 DIAGNOSIS — C50411 Malignant neoplasm of upper-outer quadrant of right female breast: Secondary | ICD-10-CM

## 2019-10-21 DIAGNOSIS — Z79899 Other long term (current) drug therapy: Secondary | ICD-10-CM | POA: Diagnosis not present

## 2019-10-21 DIAGNOSIS — Z87891 Personal history of nicotine dependence: Secondary | ICD-10-CM | POA: Diagnosis not present

## 2019-10-21 DIAGNOSIS — M48062 Spinal stenosis, lumbar region with neurogenic claudication: Secondary | ICD-10-CM | POA: Diagnosis not present

## 2019-10-21 DIAGNOSIS — Z170 Estrogen receptor positive status [ER+]: Secondary | ICD-10-CM | POA: Insufficient documentation

## 2019-10-21 DIAGNOSIS — Z803 Family history of malignant neoplasm of breast: Secondary | ICD-10-CM | POA: Diagnosis not present

## 2019-10-21 DIAGNOSIS — Z923 Personal history of irradiation: Secondary | ICD-10-CM | POA: Diagnosis not present

## 2019-10-21 DIAGNOSIS — Z801 Family history of malignant neoplasm of trachea, bronchus and lung: Secondary | ICD-10-CM | POA: Insufficient documentation

## 2019-10-21 DIAGNOSIS — Z8349 Family history of other endocrine, nutritional and metabolic diseases: Secondary | ICD-10-CM | POA: Insufficient documentation

## 2019-10-21 LAB — COMPREHENSIVE METABOLIC PANEL
ALT: 8 U/L (ref 0–44)
AST: 13 U/L — ABNORMAL LOW (ref 15–41)
Albumin: 3.8 g/dL (ref 3.5–5.0)
Alkaline Phosphatase: 110 U/L (ref 38–126)
Anion gap: 6 (ref 5–15)
BUN: 22 mg/dL (ref 8–23)
CO2: 31 mmol/L (ref 22–32)
Calcium: 9.4 mg/dL (ref 8.9–10.3)
Chloride: 106 mmol/L (ref 98–111)
Creatinine, Ser: 0.87 mg/dL (ref 0.44–1.00)
GFR, Estimated: >60 mL/min (ref >60)
Glucose, Bld: 89 mg/dL (ref 70–99)
Potassium: 4 mmol/L (ref 3.5–5.1)
Sodium: 143 mmol/L (ref 135–145)
Total Bilirubin: 0.9 mg/dL (ref 0.3–1.2)
Total Protein: 6.6 g/dL (ref 6.5–8.1)

## 2019-10-21 LAB — CBC WITH DIFFERENTIAL/PLATELET
Abs Immature Granulocytes: 0.05 10*3/uL (ref 0.00–0.07)
Basophils Absolute: 0.1 10*3/uL (ref 0.0–0.1)
Basophils Relative: 1 %
Eosinophils Absolute: 0.3 10*3/uL (ref 0.0–0.5)
Eosinophils Relative: 4 %
HCT: 39.8 % (ref 36.0–46.0)
Hemoglobin: 13.1 g/dL (ref 12.0–15.0)
Immature Granulocytes: 1 %
Lymphocytes Relative: 20 %
Lymphs Abs: 1.5 10*3/uL (ref 0.7–4.0)
MCH: 33.2 pg (ref 26.0–34.0)
MCHC: 32.9 g/dL (ref 30.0–36.0)
MCV: 101 fL — ABNORMAL HIGH (ref 80.0–100.0)
Monocytes Absolute: 0.6 10*3/uL (ref 0.1–1.0)
Monocytes Relative: 8 %
Neutro Abs: 4.8 10*3/uL (ref 1.7–7.7)
Neutrophils Relative %: 66 %
Platelets: 299 10*3/uL (ref 150–400)
RBC: 3.94 MIL/uL (ref 3.87–5.11)
RDW: 13.1 % (ref 11.5–15.5)
WBC: 7.2 10*3/uL (ref 4.0–10.5)
nRBC: 0 % (ref 0.0–0.2)

## 2019-10-21 NOTE — Progress Notes (Signed)
[facility]  [phone] [phone]     ID: Holly Best DOB: 06/05/1950  MR#: [id number]  KKX#:[id number]  Patient Care Team: Manon Hilding, MD as PCP - General (Cardiology) Debara Pickett Nadean Corwin, MD as PCP - Cardiology (Cardiology) Jovita Kussmaul, MD as Consulting Physician (General Surgery) Gwenn Teodoro, [name], MD as Consulting Physician (Oncology) Eppie Gibson, MD as Attending Physician (Radiation Oncology) Susa Day, MD as Consulting Physician (Orthopedic Surgery) Sandford Craze, MD as Referring Physician (Dermatology) Suella Broad, MD as Consulting Physician (Physical Medicine and Rehabilitation) Virgia Land, MD as Referring Physician (Internal Medicine) Eustace Moore, MD as Consulting Physician (Neurosurgery) OTHER MD:  CHIEF COMPLAINT: Estrogen receptor positive breast cancer  CURRENT TREATMENT: Currently on observation   INTERVAL HISTORY: Holly Best returns today for follow-up of her estrogen receptor positive breast cancer accompanied by her husband Holly Best.   She continues to struggle on anastrozole.  She has "bone pain all over", including her hands, quads, low back and hips.  This is worse than a year ago.  Hot flashes and vaginal dryness are not major concerns  Since her last visit, she underwent bilateral diagnostic mammography with tomography at [facility] on 12/27/2018 showing: breast density category C; no evidence of malignancy in either breast.   She also underwent bone density screening the same day showing a T-score of -1.2, which is considered osteopenic.   REVIEW OF SYSTEMS: Holly Best has not yet had a very thorough cardiac evaluation and had some changes in her medications.  She gets injections through Dr. Nelva Bush for her back pain and that seems to help.  She is normally active but does not otherwise exercise.  She has received immunizations for COVID-19.  A detailed review of systems today was otherwise  stable.   BREAST CANCER HISTORY: From the original intake note:  Holly Best had screening mammography July 2018 showing breast density category C, and a possible mass in the upper-outer quadrant of the right breast and calcifications in the lower inner quadrant of the left breast. On 07/17/2016 she underwent bilateral diagnostic mammography with ultrason[facility] Breast density was category C. In the left breast, a group of coarse calcifications in the lower inner quadrant measuring 1.8 cm were felt to be benign.  In the right breast there was a spiculated mass in the upper-outer quadrant which was palpable as a 2.5 cm area of thickening at the 11:00 position of the right breast. There was no palpable right axillary adenopathy. Ultrasonography of the right breast mass confirmed a 0.8 cm hypoechoic and irregular mass at the 10:30-11:00 position 6 cm from the nipple. Ultrasound of the right axilla was sonographically benign.  Biopsy of the right breast mass in question 07/22/2016 showed ([id number]) and invasive ductal carcinoma, grade 1, estrogen receptor 100% positive, progesterone receptor 90% positive, both with strong staining intensity, with an MIB-1 of 5%, and HER-2 not amplified with a signals ratio of 1.15 and the number per cell 2.25.  The patient's subsequent history is as detailed below.   PAST MEDICAL HISTORY: Past Medical History:  Diagnosis Date  . Cancer [facility])    right breast cancer  . Crohn disease ([place])    back in 1991, [place] in 2002 and no longer has problems with this  . Depression   . Family history of breast cancer   . History of radiation therapy 09/10/16- 10/08/16   Right Breast 2.67 Gy in 15 fractions, Right Breast  boost, 2 Gy in 5 fractions.   . Hypercholesteremia   . Personal history of radiation therapy     PAST SURGICAL HISTORY: Past Surgical History:  Procedure Laterality Date  . BREAST LUMPECTOMY Right  2018  . BREAST LUMPECTOMY WITH RADIOACTIVE SEED AND SENTINEL LYMPH NODE BIOPSY Right 08/06/2016   Procedure: BREAST LUMPECTOMY WITH RADIOACTIVE SEED AND SENTINEL LYMPH NODE BIOPSY;  Surgeon: Jovita Kussmaul, MD;  Location: [facility];  Service: General;  Laterality: Right;  . COLONOSCOPY    . dental implants     upper  . LUMBAR LAMINECTOMY Bilateral 03/23/2013   Procedure: MICRO LUMBAR DECOMPRESSION, FORAMINOTOMY L4-5;  Surgeon: Johnn Hai, MD;  Location: [facility];  Service: Orthopedics;  Laterality: Bilateral;  . POSTERIOR LUMBAR FUSION  10/16/2017    Posterior Lumbar Interbody Fusion - Lumbar Two-Lumbar Three -     FAMILY HISTORY Family History  Problem Relation Age of Onset  . Breast cancer Sister 18  . Breast cancer Other 55  . Lung cancer Mother   . Lung cancer Father   . Breast cancer Maternal Grandmother        dx in her 73s  . Other Brother        non-malignant spinal tumor  . Breast cancer Maternal Aunt        dx > 50  . Lupus Maternal Aunt   . Breast cancer Maternal Aunt        dx >50  The patient's father died from lung cancer at age 25. The patient's mother also died from lung cancer at age 39. The patient has 3 brothers, one sister. The patient's sister was diagnosed with breast cancer at age 5. The patient had a niece with breast cancer (not her sister's daughter) at age 58.    GYNECOLOGIC HISTORY:  No LMP recorded. Patient is postmenopausal. Menarche age 4, first live birth age 33 the patient is [place] P1. She went through the change of life approximately age 73. She did not take hormone replacement. She never used oral contraceptives.   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 Her husband Holly Best") worked in Charity fundraiser. Their son lives in [place] and works for [employer]. The patient has 2 grandchildren. They attend a nondenominational church in [place].     ADVANCED DIRECTIVES: In place   HEALTH  MAINTENANCE: Social History   Tobacco Use  . Smoking status: Former Smoker    Packs/day: 1.00    Years: 10.00    Pack years: 10.00    Types: Cigarettes    Quit date: 02/17/1978    Years since quitting: 41.7  . Smokeless tobacco: Never Used  Vaping Use  . Vaping Use: Never used  Substance Use Topics  . Alcohol use: Yes    Comment: occassionally, on weekends  . Drug use: No     Colonoscopy: 2018  PAP:  Bone density:   No Known Allergies  Current Outpatient Medications  Medication Sig Dispense Refill  . amLODipine (NORVASC) 2.5 MG tablet Take 1 tablet (2.5 mg total) by mouth daily. 90 tablet 3  . anastrozole (ARIMIDEX) 1 MG tablet Take 1 tablet (1 mg total) by mouth daily. 90 tablet 4  . aspirin EC 81 MG tablet Take 81 mg by mouth at bedtime.     . calcium carbonate (TUMS - DOSED IN MG ELEMENTAL CALCIUM) 500 MG chewable tablet Chew 500 mg by mouth at bedtime.     Marland Kitchen  Cholecalciferol (VITAMIN D3) 2000 units TABS Take 2,000 Units by mouth daily.    . Evolocumab (REPATHA SURECLICK) 161 MG/ML SOAJ Inject 1 Dose into the skin every 14 (fourteen) days. 2 mL 11  . gabapentin (NEURONTIN) 100 MG capsule Take 100 mg by mouth 2 (two) times daily.    Marland Kitchen LORazepam (ATIVAN) 1 MG tablet Take 1 mg by mouth at bedtime.    Marland Kitchen oxyCODONE-acetaminophen (PERCOCET/ROXICET) 5-325 MG tablet Take 1 tablet by mouth every 6 (six) hours as needed for severe pain.    Marland Kitchen PARoxetine (PAXIL) 20 MG tablet Take 20 mg by mouth every evening.     No current facility-administered medications for this visit.    OBJECTIVE: White woman who appears stated age  28:   10/21/19 1204  BP: (!) 143/77  Pulse: 85  Resp: 18  Temp: 98 F (36.7 C)  SpO2: 98%    Body mass index is 24.16 kg/m.   Wt Readings from Last 3 Encounters:  10/21/19 149 lb 11.2 oz (67.9 kg)  10/03/19 150 lb 9.6 oz (68.3 kg)  08/02/19 154 lb 9.6 oz (70.1 kg)   ECOG FS:2 - Symptomatic, <50% confined to bed   Sclerae unicteric, EOMs  intact Wearing a mask No cervical or supraclavicular adenopathy Lungs no rales or rhonchi Heart regular rate and rhythm Abd soft, nontender, positive bowel sounds MSK no focal spinal tenderness, no upper extremity lymphedema Neuro: nonfocal, well oriented, appropriate affect Breasts: The right breast is status post lumpectomy followed by radiation.  Aside from the expected posttreatment changes there is no evidence of disease.  The left breast is benign.  Both axillae are benign.   LAB RESULTS:  CMP     Component Value Date/Time   NA 143 10/21/2019 1137   NA 140 08/30/2019 1324   NA 142 12/08/2016 0942   K 4.0 10/21/2019 1137   K 3.8 12/08/2016 0942   CL 106 10/21/2019 1137   CO2 31 10/21/2019 1137   CO2 27 12/08/2016 0942   GLUCOSE 89 10/21/2019 1137   GLUCOSE 83 12/08/2016 0942   BUN 22 10/21/2019 1137   BUN 16 08/30/2019 1324   BUN 14.3 12/08/2016 0942   CREATININE 0.87 10/21/2019 1137   CREATININE 0.82 07/20/2017 1056   CREATININE 0.9 12/08/2016 0942   CALCIUM 9.4 10/21/2019 1137   CALCIUM 9.9 12/08/2016 0942   PROT 6.6 10/21/2019 1137   PROT 7.3 12/08/2016 0942   ALBUMIN 3.8 10/21/2019 1137   ALBUMIN 4.3 12/08/2016 0942   AST 13 (L) 10/21/2019 1137   AST 17 07/20/2017 1056   AST 18 12/08/2016 0942   ALT 8 10/21/2019 1137   ALT 15 07/20/2017 1056   ALT 16 12/08/2016 0942   ALKPHOS 110 10/21/2019 1137   ALKPHOS 82 12/08/2016 0942   BILITOT 0.9 10/21/2019 1137   BILITOT 0.9 07/20/2017 1056   BILITOT 1.02 12/08/2016 0942   GFRNONAA >60 10/21/2019 1137   GFRNONAA >60 07/20/2017 1056   GFRAA 94 08/30/2019 1324   GFRAA >60 07/20/2017 1056    No results found for: TOTALPROTELP, ALBUMINELP, A1GS, A2GS, BETS, BETA2SER, GAMS, MSPIKE, SPEI  No results found for: KPAFRELGTCHN, LAMBDASER, KAPLAMBRATIO  Lab Results  Component Value Date   WBC 7.2 10/21/2019   NEUTROABS 4.8 10/21/2019   HGB 13.1 10/21/2019   HCT 39.8 10/21/2019   MCV 101.0 (H) 10/21/2019   PLT  299 10/21/2019      Chemistry      Component Value Date/Time   NA  143 10/21/2019 1137   NA 140 08/30/2019 1324   NA 142 12/08/2016 0942   K 4.0 10/21/2019 1137   K 3.8 12/08/2016 0942   CL 106 10/21/2019 1137   CO2 31 10/21/2019 1137   CO2 27 12/08/2016 0942   BUN 22 10/21/2019 1137   BUN 16 08/30/2019 1324   BUN 14.3 12/08/2016 0942   CREATININE 0.87 10/21/2019 1137   CREATININE 0.82 07/20/2017 1056   CREATININE 0.9 12/08/2016 0942      Component Value Date/Time   CALCIUM 9.4 10/21/2019 1137   CALCIUM 9.9 12/08/2016 0942   ALKPHOS 110 10/21/2019 1137   ALKPHOS 82 12/08/2016 0942   AST 13 (L) 10/21/2019 1137   AST 17 07/20/2017 1056   AST 18 12/08/2016 0942   ALT 8 10/21/2019 1137   ALT 15 07/20/2017 1056   ALT 16 12/08/2016 0942   BILITOT 0.9 10/21/2019 1137   BILITOT 0.9 07/20/2017 1056   BILITOT 1.02 12/08/2016 0942       No results found for: LABCA2  No components found for: [id number]  No results for input(s): INR in the last 168 hours.  Urinalysis No results found for: COLORURINE, APPEARANCEUR, LABSPEC, PHURINE, GLUCOSEU, HGBUR, BILIRUBINUR, KETONESUR, PROTEINUR, UROBILINOGEN, NITRITE, LEUKOCYTESUR   STUDIES: No results found.   ELIGIBLE FOR AVAILABLE RESEARCH PROTOCOL: no   ASSESSMENT: 69 y.o. Holly Best, Holly Best woman status post right breast upper outer quadrant biopsy 07/22/2016 for a clinical T1b N0, stage IA invasive ductal carcinoma, grade 1, estrogen and progesterone receptor positive, HER-2 nonamplified, with an MIB-1 of 5%.  (1) Right lumpectomy 08/06/2016 showed a pT1b pN0, stage IA invasive ductal carcinoma, grade 1, with negative margins.    (2) Genetics testing on 08/19/2016 revealed no deleterious mutations APC, ATM, AXIN2, BARD1, BMPR1A, BRCA1, BRCA2, BRIP1, CDH1, CDKN2A (p14ARF), CDKN2A (p16INK4a), CHEK2, CTNNA1, DICER1, EPCAM (Deletion/duplication testing only), GREM1 (promoter region deletion/duplication testing only), KIT, MEN1, MLH1, MSH2, MSH3,  MSH6, MUTYH, NBN, NF1, NHTL1, PALB2, PDGFRA, PMS2, POLD1, POLE, PTEN, RAD50, RAD51C, RAD51D, SDHB, SDHC, SDHD, SMAD4, SMARCA4. STK11, TP53, TSC1, TSC2, and VHL.  The following genes were evaluated for sequence changes only: SDHA and HOXB13 c.251G>A variant only.   (3) Oncotype DX: Score of 16 predicts a 10-year risk of recurrence outside the breast of 10% of the patient's only systemic therapy is tamoxifen for 5 years.  It also predicts no benefit from chemotherapy.  (4) adjuvant radiation 09/10/2016-10/08/2016 Site/dose:   1. Right breast, 2.67 Gy in 15 fractions                         2. Boost, 2 Gy in 5 fractions   (5) Anastrozole started 11/2016, held October 2021 with multiple symptoms  (a) bone density 11/10/2016 showed a T score of -1.0 (normal)   PLAN: Holly Best is now a little over 3 years out from definitive surgery for her breast cancer with no evidence of disease recurrence.  This is very favorable.  She is taking anastrozole for 3 years but is struggling.  It is difficult to tell what is due to the anastrozole and what is simple osteoarthritis.  We are not going to be able to figure this out unless she goes off the medication for a time so she is going to start a "anastrozole vacation" between now and the end of the year.  We will then have a virtual visit January 2022.  If she is feeling considerably better at that time we will  likely switch to tamoxifen or to exemestane.  Otherwise we will go back to anastrozole for the remaining 2 years of her antiestrogens  Total encounter time 25 minutes.*  Sakoya Win, [name], MD  10/21/19 2:09 PM Medical Oncology and Hematology [facility] [place], [place] [address] Tel. [phone]    Fax. [phone]   I, Wilburn Mylar, am acting as scribe for Dr. [name]. Lamarion Mcevers.  I, Lurline Del MD, have reviewed the above documentation for accuracy and completeness, and I agree with the above.    *Total  Encounter Time as defined by the Centers for Medicare and Medicaid Services includes, in addition to the face-to-face time of a patient visit (documented in the note above) non-face-to-face time: obtaining and reviewing outside history, ordering and reviewing medications, tests or procedures, care coordination (communications with other health care professionals or caregivers) and documentation in the medical record.

## 2019-12-14 ENCOUNTER — Other Ambulatory Visit: Payer: Self-pay

## 2019-12-14 ENCOUNTER — Ambulatory Visit: Payer: Federal, State, Local not specified - PPO | Admitting: Dermatology

## 2019-12-14 DIAGNOSIS — L820 Inflamed seborrheic keratosis: Secondary | ICD-10-CM | POA: Diagnosis not present

## 2019-12-14 DIAGNOSIS — D485 Neoplasm of uncertain behavior of skin: Secondary | ICD-10-CM | POA: Diagnosis not present

## 2019-12-14 DIAGNOSIS — L814 Other melanin hyperpigmentation: Secondary | ICD-10-CM | POA: Diagnosis not present

## 2019-12-14 DIAGNOSIS — L821 Other seborrheic keratosis: Secondary | ICD-10-CM | POA: Diagnosis not present

## 2019-12-14 DIAGNOSIS — Z1283 Encounter for screening for malignant neoplasm of skin: Secondary | ICD-10-CM

## 2019-12-19 ENCOUNTER — Encounter: Payer: Self-pay | Admitting: Dermatology

## 2019-12-19 NOTE — Progress Notes (Signed)
Follow-Up Visit   Subjective  Holly Best is a 69 y.o. female who presents for the following: Skin Problem (Check spots on legs and some on ams. ).  Complete skin check Location: Enlargement of spots on leg and arm. Duration:  Quality:  Associated Signs/Symptoms: Modifying Factors:  Severity:  Timing: Context:   Objective  Well appearing patient in no apparent distress; mood and affect are within normal limits. Objective  Left Lower Back: Full body skin exam: No atypical moles, melanoma,.  Objective  Left Lower Leg - Anterior: Tan textured lesions   Objective  Left Thigh - Anterior: 5 mm verrucous tan-pink crust     Objective  Right Lower Leg - Anterior: 4 mm slightly inflamed tan-pink crust     Objective  Right Lower Leg - Anterior: Inflamed pink-tan for 6 mm papules  Objective  Head - Anterior (Face): Brown 5 mm macules, monochrome, no dermoscopic atypia.    A full examination was performed including scalp, head, eyes, ears, nose, lips, neck, chest, axillae, abdomen, back, buttocks, bilateral upper extremities, bilateral lower extremities, hands, feet, fingers, toes, fingernails, and toenails. All findings within normal limits unless otherwise noted below.   Assessment & Plan    Screening for malignant neoplasm of skin Left Lower Back  Yearly skin exams.  Seborrheic keratosis Left Lower Leg - Anterior  Benign no treatment needed.   Neoplasm of uncertain behavior of skin (2) Left Thigh - Anterior  Skin / nail biopsy Type of biopsy: tangential   Informed consent: discussed and consent obtained   Timeout: patient name, date of birth, surgical site, and procedure verified   Procedure prep:  Patient was prepped and draped in usual sterile fashion (Non sterile) Prep type:  Chlorhexidine Anesthesia: the lesion was anesthetized in a standard fashion   Anesthetic:  1% lidocaine w/ epinephrine 1-100,000 local infiltration Instrument used:  flexible razor blade   Outcome: patient tolerated procedure well   Post-procedure details: wound care instructions given    Specimen 1 - Surgical pathology Differential Diagnosis: sk Check Margins: No  Right Lower Leg - Anterior  Skin / nail biopsy Type of biopsy: tangential   Informed consent: discussed and consent obtained   Timeout: patient name, date of birth, surgical site, and procedure verified   Procedure prep:  Patient was prepped and draped in usual sterile fashion (Non sterile) Prep type:  Chlorhexidine Anesthesia: the lesion was anesthetized in a standard fashion   Anesthetic:  1% lidocaine w/ epinephrine 1-100,000 local infiltration Instrument used: flexible razor blade   Outcome: patient tolerated procedure well   Post-procedure details: wound care instructions given    Specimen 2 - Surgical pathology Differential Diagnosis: sk Check Margins: No  Inflamed seborrheic keratosis Right Lower Leg - Anterior  Destruction of lesion - Right Lower Leg - Anterior Complexity: simple   Destruction method: cryotherapy   Informed consent: discussed and consent obtained   Timeout:  patient name, date of birth, surgical site, and procedure verified Lesion destroyed using liquid nitrogen: Yes   Cryotherapy cycles:  3 Outcome: patient tolerated procedure well with no complications    Solar lentigo Head - Anterior (Face)  Patient can call for prescription for Tri Luma. Told this helps a minority of patients and is unlikely to be covered by her health insurance.      I, Lavonna Monarch, MD, have reviewed all documentation for this visit.  The documentation on 12/19/19 for the exam, diagnosis, procedures, and orders are all accurate and  complete.

## 2020-01-09 ENCOUNTER — Telehealth: Payer: Federal, State, Local not specified - PPO | Admitting: Oncology

## 2020-01-12 ENCOUNTER — Other Ambulatory Visit: Payer: Self-pay | Admitting: Oncology

## 2020-01-12 DIAGNOSIS — Z9889 Other specified postprocedural states: Secondary | ICD-10-CM

## 2020-01-12 DIAGNOSIS — Z853 Personal history of malignant neoplasm of breast: Secondary | ICD-10-CM

## 2020-01-15 NOTE — Progress Notes (Signed)
[place]  [phone] [phone]     ID: Holly Best DOB: [birth year]  MR#: [id number]  XQJ#:[id number]  Patient Care Team: Manon Hilding, MD as PCP - General (Cardiology) Debara Pickett Nadean Corwin, MD as PCP - Cardiology (Cardiology) Jovita Kussmaul, MD as Consulting Physician (General Surgery) Tamerra Merkley, [name], MD as Consulting Physician (Oncology) Eppie Gibson, MD as Attending Physician (Radiation Oncology) Susa Day, MD as Consulting Physician (Orthopedic Surgery) Sandford Craze, MD as Referring Physician (Dermatology) Suella Broad, MD as Consulting Physician (Physical Medicine and Rehabilitation) Virgia Land, MD as Referring Physician (Internal Medicine) Eustace Moore, MD as Consulting Physician (Neurosurgery) OTHER MD:  I connected with [name] on 01/16/20 at 12:00 PM EST by telephone visit and verified that I am speaking with the correct person using two identifiers.   I discussed the limitations, risks, security and privacy concerns of performing an evaluation and management service by telemedicine and the availability of in-person appointments. I also discussed with the patient that there may be a patient responsible charge related to this service. The patient expressed understanding and agreed to proceed.   Other persons participating in the visit and their role in the encounter: None  Patient's location: Home Provider's location: [place]: Estrogen receptor positive breast cancer  CURRENT TREATMENT: Currently on observation   INTERVAL HISTORY: Holly Best was contacted today for follow-up of her estrogen receptor positive breast cancer accompanied by her husband Holly Best.   She is currently on observation given her difficulties with anastrozole.  She has been off that medication about 3 months.  She says her bony aches and pains are better although not entirely gone.  Her most  recent bone density screening on 12/27/2018 a T-score of -1.2, which is considered osteopenic.  She is scheduled for annual mammography on 02/17/2020.   REVIEW OF SYSTEMS: Holly Best tells me she enjoyed the holidays which she spent mostly with grandchildren.  A detailed review of systems today was otherwise noncontributory   COVID 19 VACCINATION STATUS: Status post Moderna times 2 plus booster in November.   BREAST CANCER HISTORY: From the original intake note:  Saroya had screening mammography July 2018 showing breast density category C, and a possible mass in the upper-outer quadrant of the right breast and calcifications in the lower inner quadrant of the left breast. On 07/17/2016 she underwent bilateral diagnostic mammography with ultrasonography and [facility]. Breast density was category C. In the left breast, a group of coarse calcifications in the lower inner quadrant measuring 1.8 cm were felt to be benign.  In the right breast there was a spiculated mass in the upper-outer quadrant which was palpable as a 2.5 cm area of thickening at the 11:00 position of the right breast. There was no palpable right axillary adenopathy. Ultrasonography of the right breast mass confirmed a 0.8 cm hypoechoic and irregular mass at the 10:30-11:00 position 6 cm from the nipple. Ultrasound of the right axilla was sonographically benign.  Biopsy of the right breast mass in question 07/22/2016 showed ([id number]) and invasive ductal carcinoma, grade 1, estrogen receptor 100% positive, progesterone receptor 90% positive, both with strong staining intensity, with an MIB-1 of 5%, and HER-2 not amplified with a signals ratio of 1.15 and the number per cell 2.25.  The patient's subsequent history is as detailed below.   PAST MEDICAL HISTORY: Past Medical History:  Diagnosis Date  . Cancer ([facility])  right breast cancer  . Crohn disease ([place])    back in 1991, [place]  in 2002 and no longer has problems with this  . Depression   . Family history of breast cancer   . History of radiation therapy 09/10/16- 10/08/16   Right Breast 2.67 Gy in 15 fractions, Right Breast boost, 2 Gy in 5 fractions.   . Hypercholesteremia   . Personal history of radiation therapy     PAST SURGICAL HISTORY: Past Surgical History:  Procedure Laterality Date  . BREAST LUMPECTOMY Right 2018  . BREAST LUMPECTOMY WITH RADIOACTIVE SEED AND SENTINEL LYMPH NODE BIOPSY Right 08/06/2016   Procedure: BREAST LUMPECTOMY WITH RADIOACTIVE SEED AND SENTINEL LYMPH NODE BIOPSY;  Surgeon: Jovita Kussmaul, MD;  Location: [facility];  Service: General;  Laterality: Right;  . COLONOSCOPY    . dental implants     upper  . LUMBAR LAMINECTOMY Bilateral 03/23/2013   Procedure: MICRO LUMBAR DECOMPRESSION, FORAMINOTOMY L4-5;  Surgeon: Johnn Hai, MD;  [facility];  Service: Orthopedics;  Laterality: Bilateral;  . POSTERIOR LUMBAR FUSION  10/16/2017    Posterior Lumbar Interbody Fusion - Lumbar Two-Lumbar Three -     FAMILY HISTORY Family History  Problem Relation Age of Onset  . Breast cancer Sister 72  . Breast cancer Other 55  . Lung cancer Mother   . Lung cancer Father   . Breast cancer Maternal Grandmother        dx in her 45s  . Other Brother        non-malignant spinal tumor  . Breast cancer Maternal Aunt        dx > 50  . Lupus Maternal Aunt   . Breast cancer Maternal Aunt        dx >50  The patient's father died from lung cancer at age 89. The patient's mother also died from lung cancer at age 48. The patient has 3 brothers, one sister. The patient's sister was diagnosed with breast cancer at age 36. The patient had a niece with breast cancer (not her sister's daughter) at age 7.    GYNECOLOGIC HISTORY:  No LMP recorded. Patient is postmenopausal. Menarche age 80, first live birth age 47 the patient is [place] P1. She went through the change of life approximately age 37. She did not  take hormone replacement. She never used oral contraceptives.   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 Her husband Holly Best") worked in Charity fundraiser. Their son lives in [place] and works for [employer]. The patient has 2 grandchildren. They attend a nondenominational church in [place].     ADVANCED DIRECTIVES: In place   HEALTH MAINTENANCE: Social History   Tobacco Use  . Smoking status: Former Smoker    Packs/day: 1.00    Years: 10.00    Pack years: 10.00    Types: Cigarettes    Quit date: 02/17/1978    Years since quitting: 41.9  . Smokeless tobacco: Never Used  Vaping Use  . Vaping Use: Never used  Substance Use Topics  . Alcohol use: Yes    Comment: occassionally, on weekends  . Drug use: No     Colonoscopy: 2018  PAP:  Bone density:   No Known Allergies  Current Outpatient Medications  Medication Sig Dispense Refill  . amLODipine (NORVASC) 2.5 MG tablet Take 1 tablet (2.5 mg total) by mouth daily. 90 tablet 3  . anastrozole (ARIMIDEX) 1 MG tablet  Take 1 tablet (1 mg total) by mouth daily. 90 tablet 4  . aspirin EC 81 MG tablet Take 81 mg by mouth at bedtime.     . calcium carbonate (TUMS - DOSED IN MG ELEMENTAL CALCIUM) 500 MG chewable tablet Chew 500 mg by mouth at bedtime.     . Cholecalciferol (VITAMIN D3) 2000 units TABS Take 2,000 Units by mouth daily.    . Evolocumab (REPATHA SURECLICK) 811 MG/ML SOAJ Inject 1 Dose into the skin every 14 (fourteen) days. 2 mL 11  . gabapentin (NEURONTIN) 100 MG capsule Take 100 mg by mouth 2 (two) times daily.    Marland Kitchen LORazepam (ATIVAN) 1 MG tablet Take 1 mg by mouth at bedtime.    . mupirocin ointment (BACTROBAN) 2 % mupirocin 2 % topical ointment    . naloxone (NARCAN) 4 MG/0.1ML LIQD nasal spray kit Narcan 4 mg/actuation nasal spray  USE 1 SPRAY IN ONE NOSTRIL. REPEAT AFTER 3 MINUTES IF NO OR MINIMAL RESPONSE. CALL 911 AFTER DOSE IS GIVEN.    . naproxen (NAPROSYN)  500 MG tablet Take 500 mg by mouth 2 (two) times daily.    Marland Kitchen oxyCODONE-acetaminophen (PERCOCET/ROXICET) 5-325 MG tablet Take 1 tablet by mouth every 6 (six) hours as needed for severe pain.    Marland Kitchen PARoxetine (PAXIL) 20 MG tablet Take 20 mg by mouth every evening.    . triamcinolone (KENALOG) 0.1 % triamcinolone acetonide 0.1 % topical cream     No current facility-administered medications for this visit.    OBJECTIVE:   There were no vitals filed for this visit.  There is no height or weight on file to calculate BMI.   Wt Readings from Last 3 Encounters:  10/21/19 149 lb 11.2 oz (67.9 kg)  10/03/19 150 lb 9.6 oz (68.3 kg)  08/02/19 154 lb 9.6 oz (70.1 kg)   ECOG FS:2 - Symptomatic, <50% confined to bed   Telemedicine visit    LAB RESULTS:  CMP     Component Value Date/Time   NA 143 10/21/2019 1137   NA 140 08/30/2019 1324   NA 142 12/08/2016 0942   K 4.0 10/21/2019 1137   K 3.8 12/08/2016 0942   CL 106 10/21/2019 1137   CO2 31 10/21/2019 1137   CO2 27 12/08/2016 0942   GLUCOSE 89 10/21/2019 1137   GLUCOSE 83 12/08/2016 0942   BUN 22 10/21/2019 1137   BUN 16 08/30/2019 1324   BUN 14.3 12/08/2016 0942   CREATININE 0.87 10/21/2019 1137   CREATININE 0.82 07/20/2017 1056   CREATININE 0.9 12/08/2016 0942   CALCIUM 9.4 10/21/2019 1137   CALCIUM 9.9 12/08/2016 0942   PROT 6.6 10/21/2019 1137   PROT 7.3 12/08/2016 0942   ALBUMIN 3.8 10/21/2019 1137   ALBUMIN 4.3 12/08/2016 0942   AST 13 (L) 10/21/2019 1137   AST 17 07/20/2017 1056   AST 18 12/08/2016 0942   ALT 8 10/21/2019 1137   ALT 15 07/20/2017 1056   ALT 16 12/08/2016 0942   ALKPHOS 110 10/21/2019 1137   ALKPHOS 82 12/08/2016 0942   BILITOT 0.9 10/21/2019 1137   BILITOT 0.9 07/20/2017 1056   BILITOT 1.02 12/08/2016 0942   GFRNONAA >60 10/21/2019 1137   GFRNONAA >60 07/20/2017 1056   GFRAA 94 08/30/2019 1324   GFRAA >60 07/20/2017 1056    No results found for: TOTALPROTELP, ALBUMINELP, A1GS, A2GS, BETS,  BETA2SER, GAMS, MSPIKE, SPEI  No results found for: KPAFRELGTCHN, LAMBDASER, KAPLAMBRATIO  Lab Results  Component Value Date  WBC 7.2 10/21/2019   NEUTROABS 4.8 10/21/2019   HGB 13.1 10/21/2019   HCT 39.8 10/21/2019   MCV 101.0 (H) 10/21/2019   PLT 299 10/21/2019      Chemistry      Component Value Date/Time   NA 143 10/21/2019 1137   NA 140 08/30/2019 1324   NA 142 12/08/2016 0942   K 4.0 10/21/2019 1137   K 3.8 12/08/2016 0942   CL 106 10/21/2019 1137   CO2 31 10/21/2019 1137   CO2 27 12/08/2016 0942   BUN 22 10/21/2019 1137   BUN 16 08/30/2019 1324   BUN 14.3 12/08/2016 0942   CREATININE 0.87 10/21/2019 1137   CREATININE 0.82 07/20/2017 1056   CREATININE 0.9 12/08/2016 0942      Component Value Date/Time   CALCIUM 9.4 10/21/2019 1137   CALCIUM 9.9 12/08/2016 0942   ALKPHOS 110 10/21/2019 1137   ALKPHOS 82 12/08/2016 0942   AST 13 (L) 10/21/2019 1137   AST 17 07/20/2017 1056   AST 18 12/08/2016 0942   ALT 8 10/21/2019 1137   ALT 15 07/20/2017 1056   ALT 16 12/08/2016 0942   BILITOT 0.9 10/21/2019 1137   BILITOT 0.9 07/20/2017 1056   BILITOT 1.02 12/08/2016 0942       No results found for: LABCA2  No components found for: [id number]  No results for input(s): INR in the last 168 hours.  Urinalysis No results found for: COLORURINE, APPEARANCEUR, LABSPEC, PHURINE, GLUCOSEU, HGBUR, BILIRUBINUR, KETONESUR, PROTEINUR, UROBILINOGEN, NITRITE, LEUKOCYTESUR   STUDIES: No results found.   ELIGIBLE FOR AVAILABLE RESEARCH PROTOCOL: no   ASSESSMENT: 71 y.o. Holly Best, [place] Best status post right breast upper outer quadrant biopsy 07/22/2016 for a clinical T1b N0, stage IA invasive ductal carcinoma, grade 1, estrogen and progesterone receptor positive, HER-2 nonamplified, with an MIB-1 of 5%.  (1) Right lumpectomy 08/06/2016 showed a pT1b pN0, stage IA invasive ductal carcinoma, grade 1, with negative margins.    (2) Genetics testing on 08/19/2016 revealed no  deleterious mutations APC, ATM, AXIN2, BARD1, BMPR1A, BRCA1, BRCA2, BRIP1, CDH1, CDKN2A (p14ARF), CDKN2A (p16INK4a), CHEK2, CTNNA1, DICER1, EPCAM (Deletion/duplication testing only), GREM1 (promoter region deletion/duplication testing only), KIT, MEN1, MLH1, MSH2, MSH3, MSH6, MUTYH, NBN, NF1, NHTL1, PALB2, PDGFRA, PMS2, POLD1, POLE, PTEN, RAD50, RAD51C, RAD51D, SDHB, SDHC, SDHD, SMAD4, SMARCA4. STK11, TP53, TSC1, TSC2, and VHL.  The following genes were evaluated for sequence changes only: SDHA and HOXB13 c.251G>A variant only.   (3) Oncotype DX: Score of 16 predicts a 10-year risk of recurrence outside the breast of 10% of the patient's only systemic therapy is tamoxifen for 5 years.  It also predicts no benefit from chemotherapy.  (4) adjuvant radiation 09/10/2016-10/08/2016 Site/dose:   1. Right breast, 2.67 Gy in 15 fractions                         2. Boost, 2 Gy in 5 fractions  (5) Anastrozole started 11/2016, held October 2021 with multiple symptoms  (a) bone density 11/10/2016 showed a T score of -1.0 (normal)  (6) tamoxifen started 01/15/2018 to   PLAN: Holly Best is now a little over 3 years out from definitive surgery for her breast cancer with no evidence of disease recurrence.  This is very favorable.  Even though she still has some bony discomfort on arthritis, she does say it is better off anastrozole so we are not going back to that medication.  We discussed tamoxifen today.  She understands hot  flashes and vaginal wetness or discharge are common.  The other complication of concern is a possibility of blood clots which is uncommon but certainly not 0%.  Endometrial cancer would be very unusual since we are only planning to do tamoxifen in 2 years  She is agreeable to starting and I have called in the prescription for her.  She will have mammography in February.  We are going to have another virtual visit after her February mammogram and if all is going well we will continue that  medication until she completes a total of 5 years of antiestrogens   Angeni Chaudhuri, [name], MD  01/16/20 12:07 PM Medical Oncology and Hematology [facility] [place], [place] [address] Tel. [phone]    Fax. [phone]   I, Wilburn Mylar, am acting as scribe for Dr. [name]. Tajana Crotteau.  I, Lurline Del MD, have reviewed the above documentation for accuracy and completeness, and I agree with the above.   *Total Encounter Time as defined by the Centers for Medicare and Medicaid Services includes, in addition to the face-to-face time of a patient visit (documented in the note above) non-face-to-face time: obtaining and reviewing outside history, ordering and reviewing medications, tests or procedures, care coordination (communications with other health care professionals or caregivers) and documentation in the medical record.

## 2020-01-16 ENCOUNTER — Inpatient Hospital Stay: Payer: Federal, State, Local not specified - PPO | Attending: Oncology | Admitting: Oncology

## 2020-01-16 DIAGNOSIS — Z170 Estrogen receptor positive status [ER+]: Secondary | ICD-10-CM | POA: Diagnosis not present

## 2020-01-16 DIAGNOSIS — C50411 Malignant neoplasm of upper-outer quadrant of right female breast: Secondary | ICD-10-CM | POA: Diagnosis not present

## 2020-01-16 DIAGNOSIS — Z853 Personal history of malignant neoplasm of breast: Secondary | ICD-10-CM | POA: Insufficient documentation

## 2020-01-16 MED ORDER — TAMOXIFEN CITRATE 20 MG PO TABS: 20.0000 mg | ORAL_TABLET | Freq: Every day | ORAL | 12 refills | Status: AC

## 2020-01-18 ENCOUNTER — Telehealth: Payer: Self-pay | Admitting: Oncology

## 2020-01-18 NOTE — Telephone Encounter (Signed)
No 1/10 los. No changes made to pt's schedule.

## 2020-03-01 ENCOUNTER — Other Ambulatory Visit: Payer: Self-pay

## 2020-03-01 ENCOUNTER — Ambulatory Visit
Admission: RE | Admit: 2020-03-01 | Discharge: 2020-03-01 | Disposition: A | Discharge status: Home / Self Care | Payer: Federal, State, Local not specified - PPO | Source: Ambulatory Visit | Attending: Oncology | Admitting: Oncology

## 2020-03-01 DIAGNOSIS — Z9889 Other specified postprocedural states: Secondary | ICD-10-CM

## 2020-03-01 DIAGNOSIS — Z853 Personal history of malignant neoplasm of breast: Secondary | ICD-10-CM

## 2020-03-30 ENCOUNTER — Ambulatory Visit: Payer: Federal, State, Local not specified - PPO | Admitting: Internal Medicine

## 2020-03-30 ENCOUNTER — Encounter: Payer: Self-pay | Admitting: Internal Medicine

## 2020-03-30 ENCOUNTER — Other Ambulatory Visit: Payer: Self-pay

## 2020-03-30 VITALS — BP 132/78 | HR 69 | Ht 66.0 in | Wt 151.0 lb

## 2020-03-30 DIAGNOSIS — I272 Pulmonary hypertension, unspecified: Secondary | ICD-10-CM

## 2020-03-30 DIAGNOSIS — E7801 Familial hypercholesterolemia: Secondary | ICD-10-CM

## 2020-03-30 DIAGNOSIS — R0602 Shortness of breath: Secondary | ICD-10-CM | POA: Diagnosis not present

## 2020-03-30 NOTE — Progress Notes (Signed)
OFFICE CONSULT NOTE  Chief Complaint:  Follow-up shortness of breath, chest discomfort  Primary Care Physician: Manon Hilding, MD  HPI:  Holly Best is a 70 y.o. female who is being seen today for the evaluation of shortness of breath and chest discomfort at the request of Sasser, Silvestre Moment, MD.  This is a pleasant 70 year old female whose husband is a patient of mine.  Over the past year or so she has been experiencing more frequent shortness of breath and chest discomfort which sometimes radiates to her neck.  She also feels intermittent palpitations.  She reports a strong family history of heart disease in fact she had three brothers who had stroke and a father who had heart disease as well.  She reports longstanding dyslipidemia but can only tolerate low-dose atorvastatin 5 mg three times a week due to myalgias.  More recently her lipid profile in July 2021 showed total cholesterol of 239, HDL of 60, triglycerides 145 and LDL of 180.  She has a history of breast cancer and is on Arimidex.  Also has chronic pain for which she takes oxycodone and some anxiety on Paxil and lorazepam.  She also takes low-dose aspirin.  She describes shortness of breath with exertion, which is worse walking up hills or doing certain activities with her horses.  She has been getting more discomfort which feels like a tightness or squeezing in her chest.  Sometimes this is at rest and other times that exertion.  10/03/2019  Holly Best returns today for follow-up of shortness of breath and chest discomfort.  He underwent echocardiography as well as a CT coronary angiogram which I personally reviewed.  The echo showed normal systolic function with mild diastolic dysfunction.  There is likely some very mild aortic stenosis with a mean gradient of 11 mmHg as well as some mild aortic insufficiency.  She had mild left atrial enlargement with trivial mitral vegetation.  Her right atrial pressure was elevated at 8 mmHg  and unfortunate there was not enough tricuspid regurgitation to measure pulmonary pressures however her pulmonary arteries were noted to be dilated on a CT suggestive of pulmonary hypertension.  The CT was reassuring and that there was minimal nonobstructive coronary disease however her calcium score was 65 which was 69th percentile for age and sex matched control.  This certainly does not explain her dyspnea however is target for cardiovascular risk reduction.  Subsequently since she was not tolerant of statins, we have been able to get prior authorization for Repatha and she is already given herself 1 injection.  We will need to reassess her lipid profile in a few months.  03/30/2020  Holly Best is seen today in follow-up with her husband.  After extensive work-up was suggested that she might have some pulmonary hypertension.  I started her on low-dose calcium channel blocker and she does report some improvement in her shortness of breath.  Blood pressure is good today 132/78.  I also started her on Repatha.  This is led to a marked improvement in her dyslipidemia.  Total cholesterol now 127, HDL 72, triglycerides 54 and LDL of 44.  PMHx:  Past Medical History:  Diagnosis Date  . Cancer [facility])    right breast cancer  . Crohn disease ([place])    back in 1991, [place] in 2002 and no longer has problems with this  . Depression   . Family history of breast cancer   . History of radiation therapy 09/10/16- 10/08/16  Right Breast 2.67 Gy in 15 fractions, Right Breast boost, 2 Gy in 5 fractions.   . Hypercholesteremia   . Personal history of radiation therapy     Past Surgical History:  Procedure Laterality Date  . BREAST LUMPECTOMY Right 2018  . BREAST LUMPECTOMY WITH RADIOACTIVE SEED AND SENTINEL LYMPH NODE BIOPSY Right 08/06/2016   Procedure: BREAST LUMPECTOMY WITH RADIOACTIVE SEED AND SENTINEL LYMPH NODE BIOPSY;  Surgeon: Jovita Kussmaul, MD;  Location: [facility];  Service: General;  Laterality:  Right;  . COLONOSCOPY    . dental implants     upper  . LUMBAR LAMINECTOMY Bilateral 03/23/2013   Procedure: MICRO LUMBAR DECOMPRESSION, FORAMINOTOMY L4-5;  Surgeon: Johnn Hai, MD;  [facility];  Service: Orthopedics;  Laterality: Bilateral;  . POSTERIOR LUMBAR FUSION  10/16/2017    Posterior Lumbar Interbody Fusion - Lumbar Two-Lumbar Three -     FAMHx:  Family History  Problem Relation Age of Onset  . Breast cancer Sister 65  . Breast cancer Other 55  . Lung cancer Mother   . Lung cancer Father   . Breast cancer Maternal Grandmother        dx in her 72s  . Other Brother        non-malignant spinal tumor  . Breast cancer Maternal Aunt        dx > 50  . Lupus Maternal Aunt   . Breast cancer Maternal Aunt        dx >50    SOCHx:   reports that she quit smoking about 42 years ago. Her smoking use included cigarettes. She has a 10.00 pack-year smoking history. She has never used smokeless tobacco. She reports current alcohol use. She reports that she does not use drugs.  ALLERGIES:  No Known Allergies  ROS: Pertinent items noted in HPI and remainder of comprehensive ROS otherwise negative.  HOME MEDS: Current Outpatient Medications on File Prior to Visit  Medication Sig Dispense Refill  . amLODipine (NORVASC) 2.5 MG tablet Take 1 tablet (2.5 mg total) by mouth daily. 90 tablet 3  . aspirin EC 81 MG tablet Take 81 mg by mouth at bedtime.     . calcium carbonate (TUMS - DOSED IN MG ELEMENTAL CALCIUM) 500 MG chewable tablet Chew 500 mg by mouth at bedtime.     . Cholecalciferol (VITAMIN D3) 2000 units TABS Take 2,000 Units by mouth daily.    . Evolocumab (REPATHA SURECLICK) 671 MG/ML SOAJ Inject 1 Dose into the skin every 14 (fourteen) days. 2 mL 11  . gabapentin (NEURONTIN) 100 MG capsule Take 100 mg by mouth 2 (two) times daily.    Marland Kitchen LORazepam (ATIVAN) 1 MG tablet Take 1 mg by mouth at bedtime.    . mupirocin ointment (BACTROBAN) 2 % mupirocin 2 % topical ointment     . naloxone (NARCAN) 4 MG/0.1ML LIQD nasal spray kit Narcan 4 mg/actuation nasal spray  USE 1 SPRAY IN ONE NOSTRIL. REPEAT AFTER 3 MINUTES IF NO OR MINIMAL RESPONSE. CALL 911 AFTER DOSE IS GIVEN.    . naproxen (NAPROSYN) 500 MG tablet Take 500 mg by mouth 2 (two) times daily.    Marland Kitchen oxyCODONE-acetaminophen (PERCOCET/ROXICET) 5-325 MG tablet Take 1 tablet by mouth every 6 (six) hours as needed for severe pain.    Marland Kitchen PARoxetine (PAXIL) 20 MG tablet Take 20 mg by mouth every evening.    . triamcinolone (KENALOG) 0.1 % triamcinolone acetonide 0.1 % topical cream     No current  facility-administered medications on file prior to visit.    LABS/IMAGING: No results found for this or any previous visit (from the past 48 hour(s)). No results found.  LIPID PANEL: No results found for: CHOL, TRIG, HDL, CHOLHDL, VLDL, LDLCALC, LDLDIRECT  WEIGHTS: Wt Readings from Last 3 Encounters:  03/30/20 151 lb (68.5 kg)  10/21/19 149 lb 11.2 oz (67.9 kg)  10/03/19 150 lb 9.6 oz (68.3 kg)    VITALS: BP 132/78 (BP Location: Left Arm, Patient Position: Sitting, Cuff Size: Normal)   Pulse 69   Ht 5' 6"  (1.676 m)   Wt 151 lb (68.5 kg)   BMI 24.37 kg/m   EXAM: General appearance: alert and no distress Neck: no carotid bruit, no JVD and thyroid not enlarged, symmetric, no tenderness/mass/nodules Lungs: clear to auscultation bilaterally Heart: regular rate and rhythm Abdomen: soft, non-tender; bowel sounds normal; no masses,  no organomegaly Extremities: extremities normal, atraumatic, no cyanosis or edema Pulses: 2+ and symmetric Skin: Skin color, texture, turgor normal. No rashes or lesions Neurologic: Grossly normal Psych: Pleasant  EKG: Normal sinus rhythm at 69-personally reviewed  ASSESSMENT: 1. Pulmonary hypertension 2. Very mild aortic stenosis with mild aortic insufficiency, normal LVEF (08/2019) 3. 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 Strong family history of  cardiovascular disease 5. 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 Relative statin intolerance-myalgias 7. History of right breast cancer with radiation/chemotherapy  PLAN: 1.   Holly Best has noted some improvement in her breathing on low-dose amlodipine.  There is likely some pulmonary hypertension.  Her lipids are markedly improved on Repatha with LDL at 44.  She did have some very mild aortic stenosis which will need to reassess probably next year.  No significant coronary disease but her calcium score is elevated at 65.  No further medicine changes today.  Follow-up with me in 6 months or sooner as necessary.  Pixie Casino, MD, [facility], [place] Director of the Advanced Lipid Disorders &  Cardiovascular Risk Reduction Clinic Diplomate of the American Board of Clinical Lipidology Attending Cardiologist  Direct Dial: [phone]  Fax: [phone]  Website:  [email or web address] 03/30/2020, 2:03 PM

## 2020-03-30 NOTE — Patient Instructions (Signed)
Medication Instructions:  Your physician recommends that you continue on your current medications as directed. Please refer to the Current Medication list given to you today.  *If you need a refill on your cardiac medications before your next appointment, please call your pharmacy*  Follow-Up: At [facility], you and your health needs are our priority.  As part of our continuing mission to provide you with exceptional heart care, we have created designated Provider Care Teams.  These Care Teams include your primary Cardiologist (physician) and Advanced Practice Providers (APPs -  Physician Assistants and Nurse Practitioners) who all work together to provide you with the care you need, when you need it.  We recommend signing up for the patient portal called "MyChart".  Sign up information is provided on this After Visit Summary.  MyChart is used to connect with patients for Virtual Visits (Telemedicine).  Patients are able to view lab/test results, encounter notes, upcoming appointments, etc.  Non-urgent messages can be sent to your provider as well.   To learn more about what you can do with MyChart, go to [email or web address].    Your next appointment:   6 month(s)  The format for your next appointment:   In Person  Provider:   You may see Pixie Casino, MD or one of the following Advanced Practice Providers on your designated Care Team:    Almyra Deforest, PA-C  Fabian Sharp, PA-C or   Roby Lofts, [place]    Other Instructions

## 2020-08-30 DIAGNOSIS — E7801 Familial hypercholesterolemia: Secondary | ICD-10-CM

## 2020-09-13 ENCOUNTER — Telehealth: Payer: Self-pay | Admitting: Internal Medicine

## 2020-09-13 ENCOUNTER — Other Ambulatory Visit: Payer: Self-pay | Admitting: Internal Medicine

## 2020-09-13 NOTE — Telephone Encounter (Signed)
PA for repatha sureclick submitted via CMM (Key: [id number])

## 2020-09-13 NOTE — Telephone Encounter (Signed)
Left message for patient to call back/advise if she has completed fasting lipid panel for PA renewal, please fax if so, or if not completed a fasting lipid panel has been ordered

## 2020-09-18 NOTE — Telephone Encounter (Signed)
PA for repatha approved from 08/15/2020 - 09/14/2021

## 2020-09-26 ENCOUNTER — Other Ambulatory Visit: Payer: Self-pay | Admitting: Internal Medicine

## 2020-10-16 ENCOUNTER — Other Ambulatory Visit: Payer: Self-pay | Admitting: Internal Medicine

## 2020-12-18 ENCOUNTER — Other Ambulatory Visit: Payer: Self-pay

## 2020-12-18 ENCOUNTER — Ambulatory Visit: Payer: Federal, State, Local not specified - PPO | Admitting: Dermatology

## 2020-12-18 ENCOUNTER — Encounter: Payer: Self-pay | Admitting: Dermatology

## 2020-12-18 DIAGNOSIS — Z1283 Encounter for screening for malignant neoplasm of skin: Secondary | ICD-10-CM | POA: Diagnosis not present

## 2020-12-18 DIAGNOSIS — L851 Acquired keratosis [keratoderma] palmaris et plantaris: Secondary | ICD-10-CM

## 2020-12-18 DIAGNOSIS — L570 Actinic keratosis: Secondary | ICD-10-CM

## 2020-12-18 DIAGNOSIS — L820 Inflamed seborrheic keratosis: Secondary | ICD-10-CM

## 2020-12-18 DIAGNOSIS — Q828 Other specified congenital malformations of skin: Secondary | ICD-10-CM

## 2021-01-10 ENCOUNTER — Encounter: Payer: Self-pay | Admitting: Dermatology

## 2021-01-10 NOTE — Progress Notes (Signed)
° °  Follow-Up Visit   Subjective  Holly Best is a 71 y.o. female who presents for the following: Annual Exam (Pt here for annual skin exam. Pt has no spots of concern and has no family hx of skin cancer.).  Annual skin check, several areas of concern Location:  Duration:  Quality:  Associated Signs/Symptoms: Modifying Factors:  Severity:  Timing: Context:   Objective  Well appearing patient in no apparent distress; mood and affect are within normal limits. Head - Anterior (Face) 3 Millimeter pink hornlike crust  Right Hand - Anterior Dermal papule or thickening first webspace;.  Parent may also have  Left Foot - Anterior, Right Foot - Anterior 1 mm light verrucous papules  Left temple (3) Inflamed Pink-tan flattopped textured 6 mm papule    A full examination was performed including scalp, head, eyes, ears, nose, lips, neck, chest, axillae, abdomen, back, buttocks, bilateral upper extremities, bilateral lower extremities, hands, feet, fingers, toes, fingernails, and toenails. All findings within normal limits unless otherwise noted below.  Areas beneath undergarments not fully examined   Assessment & Plan    Solar keratosis Head - Anterior (Face)  Destruction of lesion - Head - Anterior (Face) Complexity: simple   Destruction method: cryotherapy   Informed consent: discussed and consent obtained   Lesion destroyed using liquid nitrogen: Yes   Cryotherapy cycles:  3 Outcome: patient tolerated procedure well with no complications   Post-procedure details: wound care instructions given    Acrokeratoelastoidosis Right Hand - Anterior  No intervention indicated (or available)  Acrokeratosis (2) Left Foot - Anterior; Right Foot - Anterior  For 1 symptomatic lesion which will be treated with liquid nitrogen freeze, no other intervention initiated  Destruction of lesion - Left Foot - Anterior Complexity: simple   Destruction method: cryotherapy    Informed consent: discussed and consent obtained   Lesion destroyed using liquid nitrogen: Yes   Cryotherapy cycles:  3 Outcome: patient tolerated procedure well with no complications   Post-procedure details: wound care instructions given    Inflamed seborrheic keratosis Left temple  Liquid nitrogen freeze 5 seconds  Destruction of lesion - Left temple Complexity: simple   Destruction method: cryotherapy   Informed consent: discussed and consent obtained   Lesion destroyed using liquid nitrogen: Yes   Outcome: patient tolerated procedure well with no complications   Post-procedure details: wound care instructions given    Actinic keratosis (2) Right Thigh - Anterior; Left Malar Cheek  Destruction of lesion - Left Malar Cheek, Right Thigh - Anterior Complexity: simple   Destruction method: cryotherapy   Informed consent: discussed and consent obtained   Lesion destroyed using liquid nitrogen: Yes   Cryotherapy cycles:  3 Outcome: patient tolerated procedure well with no complications   Post-procedure details: wound care instructions given        I, Lavonna Monarch, MD, have reviewed all documentation for this visit.  The documentation on 01/10/21 for the exam, diagnosis, procedures, and orders are all accurate and complete.

## 2021-02-04 ENCOUNTER — Other Ambulatory Visit: Payer: Self-pay | Admitting: *Deleted

## 2021-02-04 DIAGNOSIS — Z01419 Encounter for gynecological examination (general) (routine) without abnormal findings: Secondary | ICD-10-CM

## 2021-02-04 DIAGNOSIS — Z170 Estrogen receptor positive status [ER+]: Secondary | ICD-10-CM

## 2021-02-04 DIAGNOSIS — C50411 Malignant neoplasm of upper-outer quadrant of right female breast: Secondary | ICD-10-CM

## 2021-03-04 ENCOUNTER — Ambulatory Visit
Admission: RE | Admit: 2021-03-04 | Discharge: 2021-03-04 | Disposition: A | Discharge status: Home / Self Care | Payer: Federal, State, Local not specified - PPO | Source: Ambulatory Visit | Attending: Hematology and Oncology | Admitting: Hematology and Oncology

## 2021-03-04 ENCOUNTER — Other Ambulatory Visit: Payer: Self-pay

## 2021-03-04 DIAGNOSIS — Z01419 Encounter for gynecological examination (general) (routine) without abnormal findings: Secondary | ICD-10-CM

## 2021-03-04 DIAGNOSIS — C50411 Malignant neoplasm of upper-outer quadrant of right female breast: Secondary | ICD-10-CM

## 2021-03-04 DIAGNOSIS — Z170 Estrogen receptor positive status [ER+]: Secondary | ICD-10-CM

## 2021-03-05 ENCOUNTER — Encounter: Payer: Self-pay | Admitting: Hematology and Oncology

## 2021-03-05 ENCOUNTER — Inpatient Hospital Stay
Payer: Federal, State, Local not specified - PPO | Attending: Hematology and Oncology | Admitting: Hematology and Oncology

## 2021-03-05 VITALS — BP 141/65 | HR 80 | Temp 97.3°F | Resp 18 | Wt 153.0 lb

## 2021-03-05 DIAGNOSIS — Z170 Estrogen receptor positive status [ER+]: Secondary | ICD-10-CM

## 2021-03-05 DIAGNOSIS — Z79811 Long term (current) use of aromatase inhibitors: Secondary | ICD-10-CM | POA: Insufficient documentation

## 2021-03-05 DIAGNOSIS — C50411 Malignant neoplasm of upper-outer quadrant of right female breast: Secondary | ICD-10-CM | POA: Diagnosis not present

## 2021-03-05 NOTE — Progress Notes (Signed)
[place]  Telephone:([phone] Fax:([phone]) [phone]     ID: Holly Best DOB: October 13, 1950  MR#: [id number]  ZMO#:[id number]  Patient Care Team: Manon Hilding, MD as PCP - General (Cardiology) Debara Pickett Nadean Corwin, MD as PCP - Cardiology (Cardiology) Jovita Kussmaul, MD as Consulting Physician (General Surgery) Magrinat, [name], MD (Inactive) as Consulting Physician (Oncology) Eppie Gibson, MD as Attending Physician (Radiation Oncology) Susa Day, MD as Consulting Physician (Orthopedic Surgery) Sandford Craze, MD as Referring Physician (Dermatology) Suella Broad, MD as Consulting Physician (Physical Medicine and Rehabilitation) Virgia Land, MD as Referring Physician (Internal Medicine) Eustace Moore, MD as Consulting Physician (Neurosurgery) Lavonna Monarch, MD as Consulting Physician (Dermatology)  CHIEF COMPLAINT: Estrogen receptor positive breast cancer  CURRENT TREATMENT: Currently on observation   INTERVAL HISTORY:  Holly Best is here for a follow-up with her husband.  She has been taking tamoxifen as prescribed.  She has been tolerating this much better compared to anastrozole.  No major adverse effects.  She continues to have bone aches but these are entirely tolerable compared to when she was on anastrozole.  She had a mammogram yesterday, has not been resulted yet.  She denies any changes in her breast except that she continues to have pain in the right breast at the surgical site which has been since her surgery.  No change in breathing, bowel habits or urinary habits.  No new neurological complaints.  Rest of the pertinent 10 point ROS reviewed and negative.  REVIEW OF SYSTEMS: Holly Best tells me she enjoyed the holidays which she spent mostly with grandchildren.  A detailed review of systems today was otherwise noncontributory   COVID 19 VACCINATION STATUS: Status post Moderna times 2 plus booster in November.    BREAST CANCER  HISTORY:  From the original intake note:  Holly Best had screening mammography July 2018 showing breast density category C, and a possible mass in the upper-outer quadrant of the right breast and calcifications in the lower inner quadrant of the left breast. On 07/17/2016 she underwent bilateral diagnostic mammography with ultrasonography and [facility]. Breast density was category C. In the left breast, a group of coarse calcifications in the lower inner quadrant measuring 1.8 cm were felt to be benign.  In the right breast there was a spiculated mass in the upper-outer quadrant which was palpable as a 2.5 cm area of thickening at the 11:00 position of the right breast. There was no palpable right axillary adenopathy. Ultrasonography of the right breast mass confirmed a 0.8 cm hypoechoic and irregular mass at the 10:30-11:00 position 6 cm from the nipple. Ultrasound of the right axilla was sonographically benign.  Biopsy of the right breast mass in question 07/22/2016 showed ([id number]) and invasive ductal carcinoma, grade 1, estrogen receptor 100% positive, progesterone receptor 90% positive, both with strong staining intensity, with an MIB-1 of 5%, and HER-2 not amplified with a signals ratio of 1.15 and the number per cell 2.25.  The patient's subsequent history is as detailed below.   PAST MEDICAL HISTORY: Past Medical History:  Diagnosis Date   Cancer [facility])    right breast cancer   Crohn disease ([place])    back in 1991, [place] in 2002 and no longer has problems with this   Depression    Family history of breast cancer    History of radiation therapy 09/10/16- 10/08/16   Right Breast 2.67 Gy in 15 fractions, Right Breast boost, 2 Gy  in 5 fractions.    Hypercholesteremia    Personal history of radiation therapy     PAST SURGICAL HISTORY: Past Surgical History:  Procedure Laterality Date   BREAST LUMPECTOMY Right 2018   BREAST LUMPECTOMY WITH  RADIOACTIVE SEED AND SENTINEL LYMPH NODE BIOPSY Right 08/06/2016   Procedure: BREAST LUMPECTOMY WITH RADIOACTIVE SEED AND SENTINEL LYMPH NODE BIOPSY;  Surgeon: Jovita Kussmaul, MD;  Location: [place];  Service: General;  Laterality: Right;   COLONOSCOPY     dental implants     upper   LUMBAR LAMINECTOMY Bilateral 03/23/2013   Procedure: MICRO LUMBAR DECOMPRESSION, FORAMINOTOMY L4-5;  Surgeon: Johnn Hai, MD;  Location: [facility];  Service: Orthopedics;  Laterality: Bilateral;   POSTERIOR LUMBAR FUSION  10/16/2017    Posterior Lumbar Interbody Fusion - Lumbar Two-Lumbar Three -     FAMILY HISTORY Family History  Problem Relation Age of Onset   Breast cancer Sister 30   Breast cancer Other 7   Lung cancer Mother    Lung cancer Father    Breast cancer Maternal Grandmother        dx in her 77s   Other Brother        non-malignant spinal tumor   Breast cancer Maternal Aunt        dx > 64   Lupus Maternal Aunt    Breast cancer Maternal Aunt        dx >50  The patient's father died from lung cancer at age 69. The patient's mother also died from lung cancer at age 11. The patient has 3 brothers, one sister. The patient's sister was diagnosed with breast cancer at age 65. The patient had a niece with breast cancer (not her sister's daughter) at age 56.    GYNECOLOGIC HISTORY:  No LMP recorded. Patient is postmenopausal. Menarche age 82, first live birth age 64 the patient is Holly Best P1. She went through the change of life approximately age 43. She did not take hormone replacement. She never used oral contraceptives.   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 Her husband Holly Best") worked in Char[employer] fundraiser. Their son lives in [place] and works for [employer]. The patient has 2 grandchildren. They attend a nondenominational church in [place].     ADVANCED DIRECTIVES: In place   HEALTH MAINTENANCE: Social History   Tobacco Use    Smoking status: Former    Packs/day: 1.00    Years: 10.00    Pack years: 10.00    Types: Cigarettes    Quit date: 02/17/1978    Years since quitting: 43.0   Smokeless tobacco: Never  Vaping Use   Vaping Use: Never used  Substance Use Topics   Alcohol use: Yes    Comment: occassionally, on weekends   Drug use: No     Colonoscopy: 2018  PAP:  Bone density:   No Known Allergies  Current Outpatient Medications  Medication Sig Dispense Refill   amLODipine (NORVASC) 2.5 MG tablet TAKE ONE TABLET BY MOUTH DAILY 90 tablet 3   aspirin EC 81 MG tablet Take 81 mg by mouth at bedtime.      calcium carbonate (TUMS - DOSED IN MG ELEMENTAL CALCIUM) 500 MG chewable tablet Chew 500 mg by mouth at bedtime.      Cholecalciferol (VITAMIN D3) 2000 units TABS Take 2,000 Units by mouth daily.     gabapentin (NEURONTIN) 100 MG capsule Take 100 mg by mouth  2 (two) times daily.     LORazepam (ATIVAN) 1 MG tablet Take 1 mg by mouth at bedtime.     mupirocin ointment (BACTROBAN) 2 % mupirocin 2 % topical ointment     naloxone (NARCAN) 4 MG/0.1ML LIQD nasal spray kit Narcan 4 mg/actuation nasal spray  USE 1 SPRAY IN ONE NOSTRIL. REPEAT AFTER 3 MINUTES IF NO OR MINIMAL RESPONSE. CALL 911 AFTER DOSE IS GIVEN.     naproxen (NAPROSYN) 500 MG tablet Take 500 mg by mouth 2 (two) times daily.     oxyCODONE-acetaminophen (PERCOCET/ROXICET) 5-325 MG tablet Take 1 tablet by mouth every 6 (six) hours as needed for severe pain.     PARoxetine (PAXIL) 20 MG tablet Take 20 mg by mouth every evening.     REPATHA SURECLICK 161 MG/ML SOAJ INJECT 1ML SUBCUTANEOUSLY EVERY 14 DAYS 2 mL 11   triamcinolone (KENALOG) 0.1 % triamcinolone acetonide 0.1 % topical cream (Patient not taking: Reported on 12/18/2020)     No current facility-administered medications for this visit.    OBJECTIVE:   There were no vitals filed for this visit.  There is no height or weight on file to calculate BMI.   Wt Readings from Last 3  Encounters:  03/30/20 151 lb (68.5 kg)  10/21/19 149 lb 11.2 oz (67.9 kg)  10/03/19 150 lb 9.6 oz (68.3 kg)   ECOG FS:2 - Symptomatic, <50% confined to bed   Physical Exam Constitutional:      Appearance: Normal appearance.  HENT:     Head: Normocephalic and atraumatic.  Chest:     Comments: Bilateral breasts inspected.  The right breast surgical site is very tender but no palpable mass.  No regional adenopathy noted.  Left breast normal to inspection and palpation.  There is evidence of weight loss on breast exam. Musculoskeletal:     Cervical back: Normal range of motion and neck supple. No rigidity.  Lymphadenopathy:     Cervical: No cervical adenopathy.  Neurological:     Mental Status: She is alert.       LAB RESULTS:  CMP     Component Value Date/Time   NA 143 10/21/2019 1137   NA 140 08/30/2019 1324   NA 142 12/08/2016 0942   K 4.0 10/21/2019 1137   K 3.8 12/08/2016 0942   CL 106 10/21/2019 1137   CO2 31 10/21/2019 1137   CO2 27 12/08/2016 0942   GLUCOSE 89 10/21/2019 1137   GLUCOSE 83 12/08/2016 0942   BUN 22 10/21/2019 1137   BUN 16 08/30/2019 1324   BUN 14.3 12/08/2016 0942   CREATININE 0.87 10/21/2019 1137   CREATININE 0.82 07/20/2017 1056   CREATININE 0.9 12/08/2016 0942   CALCIUM 9.4 10/21/2019 1137   CALCIUM 9.9 12/08/2016 0942   PROT 6.6 10/21/2019 1137   PROT 7.3 12/08/2016 0942   ALBUMIN 3.8 10/21/2019 1137   ALBUMIN 4.3 12/08/2016 0942   AST 13 (L) 10/21/2019 1137   AST 17 07/20/2017 1056   AST 18 12/08/2016 0942   ALT 8 10/21/2019 1137   ALT 15 07/20/2017 1056   ALT 16 12/08/2016 0942   ALKPHOS 110 10/21/2019 1137   ALKPHOS 82 12/08/2016 0942   BILITOT 0.9 10/21/2019 1137   BILITOT 0.9 07/20/2017 1056   BILITOT 1.02 12/08/2016 0942   GFRNONAA >60 10/21/2019 1137   GFRNONAA >60 07/20/2017 1056   GFRAA 94 08/30/2019 1324   GFRAA >60 07/20/2017 1056    No results found for: TOTALPROTELP, ALBUMINELP, A1GS,  A2GS, BETS, BETA2SER, GAMS,  MSPIKE, SPEI  No results found for: Nils Pyle, [facility]  Lab Results  Component Value Date   WBC 7.2 10/21/2019   NEUTROABS 4.8 10/21/2019   HGB 13.1 10/21/2019   HCT 39.8 10/21/2019   MCV 101.0 (H) 10/21/2019   PLT 299 10/21/2019      Chemistry      Component Value Date/Time   NA 143 10/21/2019 1137   NA 140 08/30/2019 1324   NA 142 12/08/2016 0942   K 4.0 10/21/2019 1137   K 3.8 12/08/2016 0942   CL 106 10/21/2019 1137   CO2 31 10/21/2019 1137   CO2 27 12/08/2016 0942   BUN 22 10/21/2019 1137   BUN 16 08/30/2019 1324   BUN 14.3 12/08/2016 0942   CREATININE 0.87 10/21/2019 1137   CREATININE 0.82 07/20/2017 1056   CREATININE 0.9 12/08/2016 0942      Component Value Date/Time   CALCIUM 9.4 10/21/2019 1137   CALCIUM 9.9 12/08/2016 0942   ALKPHOS 110 10/21/2019 1137   ALKPHOS 82 12/08/2016 0942   AST 13 (L) 10/21/2019 1137   AST 17 07/20/2017 1056   AST 18 12/08/2016 0942   ALT 8 10/21/2019 1137   ALT 15 07/20/2017 1056   ALT 16 12/08/2016 0942   BILITOT 0.9 10/21/2019 1137   BILITOT 0.9 07/20/2017 1056   BILITOT 1.02 12/08/2016 0942       No results found for: LABCA2  No components found for: QMGNOI370  No results for input(s): INR in the last 168 hours.  Urinalysis No results found for: COLORURINE, APPEARANCEUR, LABSPEC, PHURINE, GLUCOSEU, HGBUR, BILIRUBINUR, KETONESUR, PROTEINUR, UROBILINOGEN, NITRITE, LEUKOCYTESUR   STUDIES: No results found.   ELIGIBLE FOR AVAILABLE RESEARCH PROTOCOL: no   ASSESSMENT: 71 y.o. Holly Best, Holly Best status post right breast upper outer quadrant biopsy 07/22/2016 for a clinical T1b N0, stage IA invasive ductal carcinoma, grade 1, estrogen and progesterone receptor positive, HER-2 nonamplified, with an MIB-1 of 5%.  (1) Right lumpectomy 08/06/2016 showed a pT1b pN0, stage IA invasive ductal carcinoma, grade 1, with negative margins.    (2) Genetics testing on 08/19/2016 revealed no deleterious mutations  APC, ATM, AXIN2, BARD1, BMPR1A, BRCA1, BRCA2, BRIP1, CDH1, CDKN2A (p14ARF), CDKN2A (p16INK4a), CHEK2, CTNNA1, DICER1, EPCAM (Deletion/duplication testing only), GREM1 (promoter region deletion/duplication testing only), KIT, MEN1, MLH1, MSH2, MSH3, MSH6, MUTYH, NBN, NF1, NHTL1, PALB2, PDGFRA, PMS2, POLD1, POLE, PTEN, RAD50, RAD51C, RAD51D, SDHB, SDHC, SDHD, SMAD4, SMARCA4. STK11, TP53, TSC1, TSC2, and VHL.  The following genes were evaluated for sequence changes only: SDHA and HOXB13 c.251G>A variant only.   (3) Oncotype DX: Score of 16 predicts a 10-year risk of recurrence outside the breast of 10% of the patient's only systemic therapy is tamoxifen for 5 years.  It also predicts no benefit from chemotherapy.  (4) adjuvant radiation 09/10/2016-10/08/2016  Site/dose:   1. Right breast, 2.67 Gy in 15 fractions                         2. Boost, 2 Gy in 5 fractions  (5) Anastrozole started 11/2016, held October 2021 with multiple symptoms  (a) bone density 11/10/2016 showed a T score of -1.0 (normal)  (6) tamoxifen started 01/17/2019 to present   PLAN: Patient is tolerating tamoxifen very well.  No concerning changes reported in the breast.  No adverse effects reported with tamoxifen.  Physical examination today benign, no palpable masses or regional adenopathy.  She had her mammogram yesterday  which is pending read.  We will let her know if there are any unexpected findings on the mammogram. She otherwise should continue tamoxifen at least until November 2023.  She will return to clinic in 6 months.  Total time spent: 30 minutes.  *Total Encounter Time as defined by the Centers for Medicare and Medicaid Services includes, in addition to the face-to-face time of a patient visit (documented in the note above) non-face-to-face time: obtaining and reviewing outside history, ordering and reviewing medications, tests or procedures, care coordination (communications with other health care professionals or  caregivers) and documentation in the medical record.

## 2021-03-28 ENCOUNTER — Encounter: Payer: Self-pay | Admitting: Internal Medicine

## 2021-03-28 ENCOUNTER — Other Ambulatory Visit: Payer: Self-pay

## 2021-03-28 ENCOUNTER — Ambulatory Visit: Payer: Federal, State, Local not specified - PPO | Admitting: Internal Medicine

## 2021-03-28 VITALS — BP 130/80 | HR 86 | Ht 66.0 in | Wt 147.0 lb

## 2021-03-28 DIAGNOSIS — E7801 Familial hypercholesterolemia: Secondary | ICD-10-CM

## 2021-03-28 DIAGNOSIS — M791 Myalgia, unspecified site: Secondary | ICD-10-CM | POA: Diagnosis not present

## 2021-03-28 DIAGNOSIS — T466X5A Adverse effect of antihyperlipidemic and antiarteriosclerotic drugs, initial encounter: Secondary | ICD-10-CM

## 2021-03-28 DIAGNOSIS — I272 Pulmonary hypertension, unspecified: Secondary | ICD-10-CM

## 2021-03-28 DIAGNOSIS — I351 Nonrheumatic aortic (valve) insufficiency: Secondary | ICD-10-CM

## 2021-03-28 NOTE — Patient Instructions (Signed)
Labwork: ?Lipid Panel- 1 year ? ?Testing/Procedures: ?Your physician has requested that you have an echocardiogram. Echocardiography is a painless test that uses sound waves to create images of your heart. It provides your doctor with information about the size and shape of your heart and how well your heart?s chambers and valves are working. This procedure takes approximately one hour. There are no restrictions for this procedure. ? ? ?Follow-Up: ?Follow up with Dr. Debara Pickett in 1 year ? ?Any Other Special Instructions Will Be Listed Below (If Applicable). ? ? ? ? ?If you need a refill on your cardiac medications before your next appointment, please call your pharmacy. ? ?

## 2021-03-28 NOTE — Progress Notes (Signed)
? ?OFFICE CONSULT NOTE ? ?Chief Complaint:  ?Follow-up ? ?Primary Care Physician: ?Sasser, Silvestre Moment, MD ? ?HPI:  ?Holly Best is a 71 y.o. female who is being seen today for the evaluation of shortness of breath and chest discomfort at the request of Sasser, Silvestre Moment, MD.  This is a pleasant 71 year old female whose husband is a patient of mine.  Over the past year or so she has been experiencing more frequent shortness of breath and chest discomfort which sometimes radiates to her neck.  She also feels intermittent palpitations.  She reports a strong family history of heart disease in fact she had three brothers who had stroke and a father who had heart disease as well.  She reports longstanding dyslipidemia but can only tolerate low-dose atorvastatin 5 mg three times a week due to myalgias.  More recently her lipid profile in July 2021 showed total cholesterol of 239, HDL of 60, triglycerides 145 and LDL of 180.  She has a history of breast cancer and is on Arimidex.  Also has chronic pain for which she takes oxycodone and some anxiety on Paxil and lorazepam.  She also takes low-dose aspirin.  She describes shortness of breath with exertion, which is worse walking up hills or doing certain activities with her horses.  She has been getting more discomfort which feels like a tightness or squeezing in her chest.  Sometimes this is at rest and other times that exertion. ? ?10/03/2019 ? ?[name] returns today for follow-up of shortness of breath and chest discomfort.  He underwent echocardiography as well as a CT coronary angiogram which I personally reviewed.  The echo showed normal systolic function with mild diastolic dysfunction.  There is likely some very mild aortic stenosis with a mean gradient of 11 mmHg as well as some mild aortic insufficiency.  She had mild left atrial enlargement with trivial mitral vegetation.  Her right atrial pressure was elevated at 8 mmHg and unfortunate there was not enough  tricuspid regurgitation to measure pulmonary pressures however her pulmonary arteries were noted to be dilated on a CT suggestive of pulmonary hypertension.  The CT was reassuring and that there was minimal nonobstructive coronary disease however her calcium score was 65 which was 69th percentile for age and sex matched control.  This certainly does not explain her dyspnea however is target for cardiovascular risk reduction.  Subsequently since she was not tolerant of statins, we have been able to get prior authorization for Repatha and she is already given herself 1 injection.  We will need to reassess her lipid profile in a few months. ? ?03/30/2020 ? ?[name] is seen today in follow-up with her husband.  After extensive work-up was suggested that she might have some pulmonary hypertension.  I started her on low-dose calcium channel blocker and she does report some improvement in her shortness of breath.  Blood pressure is good today 132/78.  I also started her on Repatha.  This is led to a marked improvement in her dyslipidemia.  Total cholesterol now 127, HDL 72, triglycerides 54 and LDL of 44. ? ?03/28/2021 ? ?[name] returns today for follow-up.  Overall she seems to be doing well.  She was found by echo to have some mild aortic insufficiency after noting a heart murmur in 2021.  She also had marked dyslipidemia and was found to have mild nonobstructive coronary disease with some coronary calcium by CT.  She had been started on Repatha with a  significant improvement in her lipids and LDL down to 44.  She did have recent repeat lipid testing which showed a total cholesterol 138, triglycerides 96, HDL 67 and LDL 53.  She seems to be tolerating the medicine well.  She denies any chest pain or worsening shortness of breath. ? ?PMHx:  ?Past Medical History:  ?Diagnosis Date  ? Cancer [facility])   ? right breast cancer  ? Crohn disease ([place])   ? back in [place] in 2002 and no longer has problems with  this  ? Depression   ? Family history of breast cancer   ? History of radiation therapy 09/10/16- 10/08/16  ? Right Breast 2.67 Gy in 15 fractions, Right Breast boost, 2 Gy in 5 fractions.   ? Hypercholesteremia   ? Personal history of radiation therapy   ? ? ?Past Surgical History:  ?Procedure Laterality Date  ? BREAST LUMPECTOMY Right 2018  ? BREAST LUMPECTOMY WITH RADIOACTIVE SEED AND SENTINEL LYMPH NODE BIOPSY Right 08/06/2016  ? Procedure: BREAST LUMPECTOMY WITH RADIOACTIVE SEED AND SENTINEL LYMPH NODE BIOPSY;  Surgeon: Jovita Kussmaul, MD;  Location: [facility];  Service: General;  Laterality: Right;  ? COLONOSCOPY    ? dental implants    ? upper  ? LUMBAR LAMINECTOMY Bilateral 03/23/2013  ? Procedure: MICRO LUMBAR DECOMPRESSION, FORAMINOTOMY L4-5;  Surgeon: Johnn Hai, MD;  Location: [facility];  Service: Orthopedics;  Laterality: Bilateral;  ? POSTERIOR LUMBAR FUSION  10/16/2017  ?  Posterior Lumbar Interbody Fusion - Lumbar Two-Lumbar Three -   ? ? ?FAMHx:  ?Family History  ?Problem Relation Age of Onset  ? Breast cancer Sister 91  ? Breast cancer Other 55  ? Lung cancer Mother   ? Lung cancer Father   ? Breast cancer Maternal Grandmother   ?     dx in her 80s  ? Other Brother   ?     non-malignant spinal tumor  ? Breast cancer Maternal Aunt   ?     dx > 50  ? Lupus Maternal Aunt   ? Breast cancer Maternal Aunt   ?     dx >50  ? ? ?SOCHx:  ? reports that she quit smoking about 43 years ago. Her smoking use included cigarettes. She has a 10.00 pack-year smoking history. She has never used smokeless tobacco. She reports current alcohol use. She reports that she does not use drugs. ? ?ALLERGIES:  ?No Known Allergies ? ?ROS: ?Pertinent items noted in HPI and remainder of comprehensive ROS otherwise negative. ? ?HOME MEDS: ?Current Outpatient Medications on File Prior to Visit  ?Medication Sig Dispense Refill  ? aspirin EC 81 MG tablet Take 81 mg by mouth at bedtime.     ? calcium carbonate (TUMS - DOSED IN MG ELEMENTAL  CALCIUM) 500 MG chewable tablet Chew 500 mg by mouth at bedtime.     ? gabapentin (NEURONTIN) 100 MG capsule Take 100 mg by mouth 2 (two) times daily.    ? LORazepam (ATIVAN) 1 MG tablet Take 1 mg by mouth at bedtime.    ? naloxone (NARCAN) 4 MG/0.1ML LIQD nasal spray kit Narcan 4 mg/actuation nasal spray ? USE 1 SPRAY IN ONE NOSTRIL. REPEAT AFTER 3 MINUTES IF NO OR MINIMAL RESPONSE. CALL 911 AFTER DOSE IS GIVEN.    ? naproxen (NAPROSYN) 500 MG tablet Take 500 mg by mouth 2 (two) times daily.    ? oxyCODONE-acetaminophen (PERCOCET/ROXICET) 5-325 MG tablet Take 1 tablet by mouth every 6 (  six) hours as needed for severe pain.    ? PARoxetine (PAXIL) 20 MG tablet Take 20 mg by mouth every evening.    ? REPATHA SURECLICK 956 MG/ML SOAJ INJECT 1ML SUBCUTANEOUSLY EVERY 14 DAYS 2 mL 11  ? tamoxifen (NOLVADEX) 20 MG tablet Take 20 mg by mouth daily.    ? amLODipine (NORVASC) 2.5 MG tablet TAKE ONE TABLET BY MOUTH DAILY 90 tablet 3  ? Cholecalciferol (VITAMIN D3) 2000 units TABS Take 2,000 Units by mouth daily.    ? mupirocin ointment (BACTROBAN) 2 % mupirocin 2 % topical ointment    ? triamcinolone (KENALOG) 0.1 % triamcinolone acetonide 0.1 % topical cream (Patient not taking: Reported on 12/18/2020)    ? ?No current facility-administered medications on file prior to visit.  ? ? ?LABS/IMAGING: ?No results found for this or any previous visit (from the past 48 hour(s)). ?No results found. ? ?LIPID PANEL: ?No results found for: CHOL, TRIG, HDL, CHOLHDL, VLDL, LDLCALC, LDLDIRECT ? ?WEIGHTS: ?Wt Readings from Last 3 Encounters:  ?03/28/21 147 lb (66.7 kg)  ?03/05/21 153 lb (69.4 kg)  ?03/30/20 151 lb (68.5 kg)  ? ? ?VITALS: ?BP 130/80   Pulse 86   Ht 5' 6"  (1.676 m)   Wt 147 lb (66.7 kg)   SpO2 98%   BMI 23.73 kg/m?  ? ?EXAM: ?General appearance: alert and no distress ?Neck: no carotid bruit, no JVD, and thyroid not enlarged, symmetric, no tenderness/mass/nodules ?Lungs: clear to auscultation bilaterally ?Heart: regular  rate and rhythm, no S3 or S4, and systolic murmur: systolic ejection 3/6, crescendo at 2nd right intercostal space ?Abdomen: soft, non-tender; bowel sounds normal; no masses,  no organomegaly ?Extremities: ext

## 2021-04-16 ENCOUNTER — Ambulatory Visit (HOSPITAL_COMMUNITY)
Admission: RE | Admit: 2021-04-16 | Discharge: 2021-04-16 | Disposition: A | Discharge status: Home / Self Care | Payer: Federal, State, Local not specified - PPO | Source: Ambulatory Visit | Attending: Internal Medicine | Admitting: Internal Medicine

## 2021-04-16 DIAGNOSIS — I351 Nonrheumatic aortic (valve) insufficiency: Secondary | ICD-10-CM | POA: Diagnosis present

## 2021-04-16 DIAGNOSIS — E7801 Familial hypercholesterolemia: Secondary | ICD-10-CM | POA: Insufficient documentation

## 2021-04-16 LAB — ECHOCARDIOGRAM COMPLETE
AR max vel: 1.06 cm2
AV Area VTI: 1.16 cm2
AV Area mean vel: 1.12 cm2
AV Mean grad: 8 mmHg
AV Peak grad: 16.2 mmHg
Ao pk vel: 2.01 m/s
Area-P 1/2: 2.92 cm2
Calc EF: 68.9 %
MV VTI: 1.59 cm2
P 1/2 time: 612 msec
S' Lateral: 2.55 cm
Single Plane A2C EF: 67.5 %
Single Plane A4C EF: 69.6 %

## 2021-04-16 NOTE — Progress Notes (Signed)
*  PRELIMINARY RESULTS* ?Echocardiogram ?2D Echocardiogram has been performed. ? ?Holly Best ?04/16/2021, 3:55 PM ?

## 2021-04-29 ENCOUNTER — Encounter: Payer: Self-pay | Admitting: Internal Medicine

## 2021-06-11 ENCOUNTER — Other Ambulatory Visit: Payer: Self-pay | Admitting: Internal Medicine

## 2021-06-11 NOTE — Telephone Encounter (Signed)
*  STAT* If patient is at the pharmacy, call can be transferred to refill team.   1. Which medications need to be refilled? (please list name of each medication and dose if known) REPATHA SURECLICK 478 MG/ML SOAJ  2. Which pharmacy/location (including street and city if local pharmacy) is medication to be sent to? [facility] - [place], [place] - [place]  3. Do they need a 30 day or 90 day supply? 30 day

## 2021-06-13 MED ORDER — REPATHA SURECLICK 140 MG/ML ~~LOC~~ SOAJ: SUBCUTANEOUS | 11 refills | Status: DC

## 2021-08-15 ENCOUNTER — Telehealth: Payer: Self-pay | Admitting: Internal Medicine

## 2021-08-15 NOTE — Telephone Encounter (Signed)
Faxed PA for repatha to [facility] at [phone]

## 2021-08-29 ENCOUNTER — Telehealth: Payer: Self-pay | Admitting: *Deleted

## 2021-08-29 NOTE — Telephone Encounter (Signed)
Message left by pt's husband asking for a call per need to reschedule her appt.  Return call number given as [phone] and/or [phone].  This RN called first number with no answer and no vm box set up. Per 2nd number obtained VM- message left call was being returned and requested a call back.

## 2021-09-02 ENCOUNTER — Inpatient Hospital Stay: Payer: Federal, State, Local not specified - PPO | Admitting: Hematology and Oncology

## 2021-09-10 NOTE — Telephone Encounter (Signed)
Received PA request in [facility] for Repatha  Attempted to submit Message received:  Your PA has been resolved, no additional PA is required. For further inquiries please contact the number on the back of the member prescription card

## 2021-10-01 ENCOUNTER — Other Ambulatory Visit: Payer: Self-pay

## 2021-10-01 ENCOUNTER — Inpatient Hospital Stay
Payer: Federal, State, Local not specified - PPO | Attending: Hematology and Oncology | Admitting: Hematology and Oncology

## 2021-10-01 ENCOUNTER — Encounter: Payer: Self-pay | Admitting: Hematology and Oncology

## 2021-10-01 VITALS — BP 142/82 | HR 73 | Temp 98.6°F | Resp 16 | Ht 66.0 in | Wt 148.9 lb

## 2021-10-01 DIAGNOSIS — C50411 Malignant neoplasm of upper-outer quadrant of right female breast: Secondary | ICD-10-CM

## 2021-10-01 DIAGNOSIS — Z923 Personal history of irradiation: Secondary | ICD-10-CM | POA: Diagnosis not present

## 2021-10-01 DIAGNOSIS — Z79899 Other long term (current) drug therapy: Secondary | ICD-10-CM | POA: Insufficient documentation

## 2021-10-01 DIAGNOSIS — Z170 Estrogen receptor positive status [ER+]: Secondary | ICD-10-CM

## 2021-10-01 DIAGNOSIS — Z853 Personal history of malignant neoplasm of breast: Secondary | ICD-10-CM | POA: Insufficient documentation

## 2021-10-01 NOTE — Progress Notes (Signed)
[facility]  [phone] [phone]     ID: Holly Best DOB: [birth year]  MR#: [id number]  IOE#:[id number]  Patient Care Team: Holly Hilding, MD as PCP - General (Cardiology) Holly Pickett Nadean Corwin, MD as PCP - Cardiology (Cardiology) Holly Kussmaul, MD as Consulting Physician (General Surgery) Magrinat, [name], MD (Inactive) as Consulting Physician (Oncology) Holly Gibson, MD as Attending Physician (Radiation Oncology) Holly Day, MD as Consulting Physician (Orthopedic Surgery) Holly Craze, MD as Referring Physician (Dermatology) Holly Broad, MD as Consulting Physician (Physical Medicine and Rehabilitation) Holly Land, MD as Referring Physician (Internal Medicine) Holly Moore, MD as Consulting Physician (Neurosurgery) Holly Monarch, MD (Inactive) as Consulting Physician (Dermatology)  CHIEF COMPLAINT: Estrogen receptor positive breast cancer  CURRENT TREATMENT:    INTERVAL HISTORY:  Holly Best is here for a follow-up with her husband.  She has now completed 5 years of antiestrogen therapy.  She completed her last bottle about 2 weeks ago and she did not refill it.  She denies any new changes.  She overall feels much better since she stopped tamoxifen.  Last mammogram in February 2023, unremarkable   COVID 19 VACCINATION STATUS: Status post Moderna times 2 plus booster in November.    BREAST CANCER HISTORY:  From the original intake note:  Holly Best had screening mammography July 2018 showing breast density category C, and a possible mass in the upper-outer quadrant of the right breast and calcifications in the lower inner quadrant of the left breast. On 07/17/2016 she underwent bilateral diagnostic mammography with ultrasonography and [facility]. Breast density was category C. In the left breast, a group of coarse calcifications in the lower inner quadrant measuring 1.8 cm were felt to  be benign.  In the right breast there was a spiculated mass in the upper-outer quadrant which was palpable as a 2.5 cm area of thickening at the 11:00 position of the right breast. There was no palpable right axillary adenopathy. Ultrasonography of the right breast mass confirmed a 0.8 cm hypoechoic and irregular mass at the 10:30-11:00 position 6 cm from the nipple. Ultrasound of the right axilla was sonographically benign.  Biopsy of the right breast mass in question 07/22/2016 showed ([id number]) and invasive ductal carcinoma, grade 1, estrogen receptor 100% positive, progesterone receptor 90% positive, both with strong staining intensity, with an MIB-1 of 5%, and HER-2 not amplified with a signals ratio of 1.15 and the number per cell 2.25.  The patient's subsequent history is as detailed below.   PAST MEDICAL HISTORY: Past Medical History:  Diagnosis Date   Cancer [facility])    right breast cancer   Crohn disease ([place])    back in 1991, Holly Best in 2002 and no longer has problems with this   Depression    Family history of breast cancer    History of radiation therapy 09/10/16- 10/08/16   Right Breast 2.67 Gy in 15 fractions, Right Breast boost, 2 Gy in 5 fractions.    Hypercholesteremia    Personal history of radiation therapy     PAST SURGICAL HISTORY: Past Surgical History:  Procedure Laterality Date   BREAST LUMPECTOMY Right 2018   BREAST LUMPECTOMY WITH RADIOACTIVE SEED AND SENTINEL LYMPH NODE BIOPSY Right 08/06/2016   Procedure: BREAST LUMPECTOMY WITH RADIOACTIVE SEED AND SENTINEL LYMPH NODE BIOPSY;  Surgeon: Holly Kussmaul, MD;  Location: [facility];  Service: General;  Laterality: Right;   COLONOSCOPY     dental implants  upper   LUMBAR LAMINECTOMY Bilateral 03/23/2013   Procedure: MICRO LUMBAR DECOMPRESSION, FORAMINOTOMY L4-5;  Surgeon: Johnn Hai, MD;  [facility];  Service: Orthopedics;  Laterality: Bilateral;   POSTERIOR LUMBAR FUSION  10/16/2017    Posterior Lumbar  Interbody Fusion - Lumbar Two-Lumbar Three -     FAMILY HISTORY Family History  Problem Relation Age of Onset   Breast cancer Sister 92   Breast cancer Other 11   Lung cancer Mother    Lung cancer Father    Breast cancer Maternal Grandmother        dx in her 38s   Other Brother        non-malignant spinal tumor   Breast cancer Maternal Aunt        dx > 61   Lupus Maternal Aunt    Breast cancer Maternal Aunt        dx >50  The patient's father died from lung cancer at age 4. The patient's mother also died from lung cancer at age 34. The patient has 3 brothers, one sister. The patient's sister was diagnosed with breast cancer at age 88. The patient had a niece with breast cancer (not her sister's daughter) at age 31.    GYNECOLOGIC HISTORY:  No LMP recorded. Patient is postmenopausal. Menarche age 86, first live birth age 89 the patient is [place] P1. She went through the change of life approximately age 20. She did not take hormone replacement. She never used oral contraceptives.   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 Her husband Holly Best") worked in Charity fundraiser. Their son lives in [place] and works for [employer]. The patient has 2 grandchildren. They attend a nondenominational church in [place].     ADVANCED DIRECTIVES: In place   HEALTH MAINTENANCE: Social History   Tobacco Use   Smoking status: Former    Packs/Best: 1.00    Years: 10.00    Total pack years: 10.00    Types: Cigarettes    Quit date: 02/17/1978    Years since quitting: 43.6   Smokeless tobacco: Never  Vaping Use   Vaping Use: Never used  Substance Use Topics   Alcohol use: Yes    Comment: occassionally, on weekends   Drug use: No     Colonoscopy: 2018  PAP:  Bone density:   No Known Allergies  Current Outpatient Medications  Medication Sig Dispense Refill   amLODipine (NORVASC) 2.5 MG tablet TAKE ONE TABLET BY MOUTH DAILY 90 tablet  3   aspirin EC 81 MG tablet Take 81 mg by mouth at bedtime.      calcium carbonate (TUMS - DOSED IN MG ELEMENTAL CALCIUM) 500 MG chewable tablet Chew 500 mg by mouth at bedtime.      Cholecalciferol (VITAMIN D3) 2000 units TABS Take 2,000 Units by mouth daily.     Evolocumab (REPATHA SURECLICK) 034 MG/ML SOAJ INJECT 1ML SUBCUTANEOUSLY EVERY 14 DAYS 2 mL 11   gabapentin (NEURONTIN) 100 MG capsule Take 100 mg by mouth 2 (two) times daily.     LORazepam (ATIVAN) 1 MG tablet Take 1 mg by mouth at bedtime.     mupirocin ointment (BACTROBAN) 2 % mupirocin 2 % topical ointment     naloxone (NARCAN) 4 MG/0.1ML LIQD nasal spray kit Narcan 4 mg/actuation nasal spray  USE 1 SPRAY IN ONE NOSTRIL. REPEAT AFTER 3 MINUTES IF NO OR MINIMAL RESPONSE. CALL 911 AFTER DOSE IS GIVEN.  naproxen (NAPROSYN) 500 MG tablet Take 500 mg by mouth 2 (two) times daily.     oxyCODONE-acetaminophen (PERCOCET/ROXICET) 5-325 MG tablet Take 1 tablet by mouth every 6 (six) hours as needed for severe pain.     PARoxetine (PAXIL) 20 MG tablet Take 20 mg by mouth every evening.     tamoxifen (NOLVADEX) 20 MG tablet Take 20 mg by mouth daily.     triamcinolone (KENALOG) 0.1 % triamcinolone acetonide 0.1 % topical cream (Patient not taking: Reported on 12/18/2020)     No current facility-administered medications for this visit.    OBJECTIVE:   Vitals:   10/01/21 1346  BP: (!) 142/82  Pulse: 73  Resp: 16  Temp: 98.6 F (37 C)  SpO2: 99%    Body mass index is 24.03 kg/m.   Wt Readings from Last 3 Encounters:  10/01/21 148 lb 14.4 oz (67.5 kg)  03/28/21 147 lb (66.7 kg)  03/05/21 153 lb (69.4 kg)   ECOG FS:2 - Symptomatic, <50% confined to bed   Physical Exam Constitutional:      Appearance: Normal appearance.  HENT:     Head: Normocephalic and atraumatic.  Chest:     Comments: Bilateral breasts inspected.  There is a small postop seroma at the area of previous surgery.  No other palpable masses or regional  adenopathy.  Left breast normal to inspection and palpation Musculoskeletal:     Cervical back: Normal range of motion and neck supple. No rigidity.  Lymphadenopathy:     Cervical: No cervical adenopathy.  Skin:    General: Skin is warm and dry.  Neurological:     Mental Status: She is alert.    LAB RESULTS:  CMP     Component Value Date/Time   NA 143 10/21/2019 1137   NA 140 08/30/2019 1324   NA 142 12/08/2016 0942   K 4.0 10/21/2019 1137   K 3.8 12/08/2016 0942   CL 106 10/21/2019 1137   CO2 31 10/21/2019 1137   CO2 27 12/08/2016 0942   GLUCOSE 89 10/21/2019 1137   GLUCOSE 83 12/08/2016 0942   BUN 22 10/21/2019 1137   BUN 16 08/30/2019 1324   BUN 14.3 12/08/2016 0942   CREATININE 0.87 10/21/2019 1137   CREATININE 0.82 07/20/2017 1056   CREATININE 0.9 12/08/2016 0942   CALCIUM 9.4 10/21/2019 1137   CALCIUM 9.9 12/08/2016 0942   PROT 6.6 10/21/2019 1137   PROT 7.3 12/08/2016 0942   ALBUMIN 3.8 10/21/2019 1137   ALBUMIN 4.3 12/08/2016 0942   AST 13 (L) 10/21/2019 1137   AST 17 07/20/2017 1056   AST 18 12/08/2016 0942   ALT 8 10/21/2019 1137   ALT 15 07/20/2017 1056   ALT 16 12/08/2016 0942   ALKPHOS 110 10/21/2019 1137   ALKPHOS 82 12/08/2016 0942   BILITOT 0.9 10/21/2019 1137   BILITOT 0.9 07/20/2017 1056   BILITOT 1.02 12/08/2016 0942   GFRNONAA >60 10/21/2019 1137   GFRNONAA >60 07/20/2017 1056   GFRAA 94 08/30/2019 1324   GFRAA >60 07/20/2017 1056    No results found for: "TOTALPROTELP", "ALBUMINELP", "A1GS", "A2GS", "BETS", "BETA2SER", "GAMS", "MSPIKE", "SPEI"  No results found for: "KPAFRELGTCHN", "LAMBDASER", "KAPLAMBRATIO"  Lab Results  Component Value Date   WBC 7.2 10/21/2019   NEUTROABS 4.8 10/21/2019   HGB 13.1 10/21/2019   HCT 39.8 10/21/2019   MCV 101.0 (H) 10/21/2019   PLT 299 10/21/2019      Chemistry      Component Value Date/Time  NA 143 10/21/2019 1137   NA 140 08/30/2019 1324   NA 142 12/08/2016 0942   K 4.0 10/21/2019  1137   K 3.8 12/08/2016 0942   CL 106 10/21/2019 1137   CO2 31 10/21/2019 1137   CO2 27 12/08/2016 0942   BUN 22 10/21/2019 1137   BUN 16 08/30/2019 1324   BUN 14.3 12/08/2016 0942   CREATININE 0.87 10/21/2019 1137   CREATININE 0.82 07/20/2017 1056   CREATININE 0.9 12/08/2016 0942      Component Value Date/Time   CALCIUM 9.4 10/21/2019 1137   CALCIUM 9.9 12/08/2016 0942   ALKPHOS 110 10/21/2019 1137   ALKPHOS 82 12/08/2016 0942   AST 13 (L) 10/21/2019 1137   AST 17 07/20/2017 1056   AST 18 12/08/2016 0942   ALT 8 10/21/2019 1137   ALT 15 07/20/2017 1056   ALT 16 12/08/2016 0942   BILITOT 0.9 10/21/2019 1137   BILITOT 0.9 07/20/2017 1056   BILITOT 1.02 12/08/2016 0942       No results found for: "LABCA2"  No components found for: "LMBEML544"  No results for input(s): "INR" in the last 168 hours.  Urinalysis No results found for: "COLORURINE", "APPEARANCEUR", "LABSPEC", "PHURINE", "GLUCOSEU", "HGBUR", "BILIRUBINUR", "KETONESUR", "PROTEINUR", "UROBILINOGEN", "NITRITE", "LEUKOCYTESUR"   STUDIES: No results found.   ELIGIBLE FOR AVAILABLE RESEARCH PROTOCOL: no   ASSESSMENT: 71 y.o. [place], [place] woman status post right breast upper outer quadrant biopsy 07/22/2016 for a clinical T1b N0, stage IA invasive ductal carcinoma, grade 1, estrogen and progesterone receptor positive, HER-2 nonamplified, with an MIB-1 of 5%.  (1) Right lumpectomy 08/06/2016 showed a pT1b pN0, stage IA invasive ductal carcinoma, grade 1, with negative margins.    (2) Genetics testing on 08/19/2016 revealed no deleterious mutations APC, ATM, AXIN2, BARD1, BMPR1A, BRCA1, BRCA2, BRIP1, CDH1, CDKN2A (p14ARF), CDKN2A (p16INK4a), CHEK2, CTNNA1, DICER1, EPCAM (Deletion/duplication testing only), GREM1 (promoter region deletion/duplication testing only), KIT, MEN1, MLH1, MSH2, MSH3, MSH6, MUTYH, NBN, NF1, NHTL1, PALB2, PDGFRA, PMS2, POLD1, POLE, PTEN, RAD50, RAD51C, RAD51D, SDHB, SDHC, SDHD, SMAD4, SMARCA4. STK11,  TP53, TSC1, TSC2, and VHL.  The following genes were evaluated for sequence changes only: SDHA and HOXB13 c.251G>A variant only.   (3) Oncotype DX: Score of 16 predicts a 10-year risk of recurrence outside the breast of 10% of the patient's only systemic therapy is tamoxifen for 5 years.  It also predicts no benefit from chemotherapy.  (4) adjuvant radiation 09/10/2016-10/08/2016  Site/dose:   1. Right breast, 2.67 Gy in 15 fractions                         2. Boost, 2 Gy in 5 fractions  (5) Anastrozole started 11/2016, held October 2021 with multiple symptoms  (a) bone density 11/10/2016 showed a T score of -1.0 (normal)  (6) tamoxifen started 01/17/2019 to present   PLAN:  Patient is now done with 5 yrs of anti estrogen therapy. She is doing remarkably well since she discontinued tamoxifen.  Her last mammogram in February 2023 was unremarkable.  Physical examination today without any concerns, small postop seroma appreciated in the right axilla.  She will continue annual mammograms.  I have given her the choice to continue follow-up with her PCP and return to [place] as needed versus continue follow-up with [place] once a year.  She wants to continue follow-up in the oncology clinic at this time.  All her questions were answered to the Best my knowledge.  Thank you for  consulting [place] in the care of this patient.  Please do not hesitate contact us with any additional questions or concerns. Total time spent: 30 minutes.  *Total Encounter Time as defined by the Centers for Medicare and Medicaid Services includes, in addition to the face-to-face time of a patient visit (documented in the note above) non-face-to-face time: obtaining and reviewing outside history, ordering and reviewing medications, tests or procedures, care coordination (communications with other health care professionals or caregivers) and documentation in the medical record.

## 2021-10-04 NOTE — Telephone Encounter (Signed)
PA approved 08/19/2021 - 08/16/2022

## 2021-12-18 ENCOUNTER — Ambulatory Visit: Payer: Federal, State, Local not specified - PPO | Admitting: Dermatology

## 2022-03-05 ENCOUNTER — Ambulatory Visit
Admission: RE | Admit: 2022-03-05 | Discharge: 2022-03-05 | Disposition: A | Discharge status: Home / Self Care | Payer: Federal, State, Local not specified - PPO | Source: Ambulatory Visit | Attending: Hematology and Oncology | Admitting: Hematology and Oncology

## 2022-03-05 DIAGNOSIS — C50411 Malignant neoplasm of upper-outer quadrant of right female breast: Secondary | ICD-10-CM

## 2022-03-10 ENCOUNTER — Other Ambulatory Visit: Payer: Self-pay | Admitting: Hematology and Oncology

## 2022-03-10 DIAGNOSIS — R928 Other abnormal and inconclusive findings on diagnostic imaging of breast: Secondary | ICD-10-CM

## 2022-03-15 ENCOUNTER — Other Ambulatory Visit: Payer: Self-pay | Admitting: Hematology and Oncology

## 2022-03-15 ENCOUNTER — Ambulatory Visit
Admission: RE | Admit: 2022-03-15 | Discharge: 2022-03-15 | Disposition: A | Discharge status: Home / Self Care | Payer: Federal, State, Local not specified - PPO | Source: Ambulatory Visit | Attending: Hematology and Oncology | Admitting: Hematology and Oncology

## 2022-03-15 DIAGNOSIS — R921 Mammographic calcification found on diagnostic imaging of breast: Secondary | ICD-10-CM

## 2022-03-15 DIAGNOSIS — R928 Other abnormal and inconclusive findings on diagnostic imaging of breast: Secondary | ICD-10-CM

## 2022-03-17 ENCOUNTER — Other Ambulatory Visit: Payer: Self-pay | Admitting: Hematology and Oncology

## 2022-03-17 DIAGNOSIS — Z170 Estrogen receptor positive status [ER+]: Secondary | ICD-10-CM

## 2022-03-17 NOTE — Progress Notes (Signed)
6 month mammogram scheduled.

## 2022-03-18 ENCOUNTER — Telehealth: Payer: Self-pay | Admitting: Hematology and Oncology

## 2022-03-18 NOTE — Telephone Encounter (Signed)
Spoke with patient confirming upcoming telephone visit

## 2022-07-01 ENCOUNTER — Telehealth: Payer: Self-pay | Admitting: *Deleted

## 2022-07-01 NOTE — Telephone Encounter (Signed)
Faxed rx signed by MD for mastectomy bra and prostheses to 2nd to [facility] confirmation received

## 2022-07-14 ENCOUNTER — Other Ambulatory Visit: Payer: Self-pay | Admitting: Internal Medicine

## 2022-07-14 NOTE — Telephone Encounter (Signed)
Rx(s) sent to pharmacy electronically.  

## 2022-07-22 ENCOUNTER — Other Ambulatory Visit (HOSPITAL_COMMUNITY): Payer: Self-pay

## 2022-07-22 ENCOUNTER — Telehealth: Payer: Self-pay

## 2022-07-22 NOTE — Telephone Encounter (Signed)
Pharmacy Patient Advocate Encounter   Received notification from CoverMyMeds that prior authorization for repatha is required/requested.   Insurance verification completed.   The patient is insured through [facility] .   Submitted via fax/email   Status is pending

## 2022-07-25 NOTE — Telephone Encounter (Signed)
Pharmacy Patient Advocate Encounter  Received notification from [facility] that Prior Authorization for REPATHA has been APPROVED from 6.17.24 to 7.17.25.Marland Kitchen

## 2022-08-13 ENCOUNTER — Other Ambulatory Visit: Payer: Self-pay | Admitting: Internal Medicine

## 2022-08-13 DIAGNOSIS — E7801 Familial hypercholesterolemia: Secondary | ICD-10-CM

## 2022-09-10 ENCOUNTER — Other Ambulatory Visit: Payer: Self-pay | Admitting: Internal Medicine

## 2022-09-15 ENCOUNTER — Ambulatory Visit
Admission: RE | Admit: 2022-09-15 | Discharge: 2022-09-15 | Disposition: A | Discharge status: Home / Self Care | Payer: Federal, State, Local not specified - PPO | Source: Ambulatory Visit | Attending: Hematology and Oncology

## 2022-09-15 ENCOUNTER — Other Ambulatory Visit: Payer: Self-pay | Admitting: Hematology and Oncology

## 2022-09-15 DIAGNOSIS — R921 Mammographic calcification found on diagnostic imaging of breast: Secondary | ICD-10-CM

## 2022-09-15 DIAGNOSIS — R928 Other abnormal and inconclusive findings on diagnostic imaging of breast: Secondary | ICD-10-CM

## 2022-09-15 DIAGNOSIS — Z170 Estrogen receptor positive status [ER+]: Secondary | ICD-10-CM

## 2022-09-17 ENCOUNTER — Encounter (HOSPITAL_BASED_OUTPATIENT_CLINIC_OR_DEPARTMENT_OTHER): Payer: Self-pay | Admitting: Nurse Practitioner

## 2022-09-17 ENCOUNTER — Ambulatory Visit (HOSPITAL_BASED_OUTPATIENT_CLINIC_OR_DEPARTMENT_OTHER): Payer: Federal, State, Local not specified - PPO | Admitting: Nurse Practitioner

## 2022-09-17 VITALS — BP 130/84 | HR 65 | Ht 66.0 in | Wt 147.6 lb

## 2022-09-17 DIAGNOSIS — I351 Nonrheumatic aortic (valve) insufficiency: Secondary | ICD-10-CM | POA: Diagnosis not present

## 2022-09-17 DIAGNOSIS — I2510 Atherosclerotic heart disease of native coronary artery without angina pectoris: Secondary | ICD-10-CM | POA: Diagnosis not present

## 2022-09-17 DIAGNOSIS — E785 Hyperlipidemia, unspecified: Secondary | ICD-10-CM

## 2022-09-17 DIAGNOSIS — E7801 Familial hypercholesterolemia: Secondary | ICD-10-CM | POA: Diagnosis not present

## 2022-09-17 NOTE — Patient Instructions (Signed)
Medication Instructions:   Your physician recommends that you continue on your current medications as directed. Please refer to the Current Medication list given to you today.   *If you need a refill on your cardiac medications before your next appointment, please call your pharmacy*   Lab Work:  None ordered.  If you have labs (blood work) drawn today and your tests are completely normal, you will receive your results only by: MyChart Message (if you have MyChart) OR A paper copy in the mail If you have any lab test that is abnormal or we need to change your treatment, we will call you to review the results.   Testing/Procedures:   None ordered.   Follow-Up: At [facility], you and your health needs are our priority.  As part of our continuing mission to provide you with exceptional heart care, we have created designated Provider Care Teams.  These Care Teams include your primary Cardiologist (physician) and Advanced Practice Providers (APPs -  Physician Assistants and Nurse Practitioners) who all work together to provide you with the care you need, when you need it.  We recommend signing up for the patient portal called "MyChart".  Sign up information is provided on this After Visit Summary.  MyChart is used to connect with patients for Virtual Visits (Telemedicine).  Patients are able to view lab/test results, encounter notes, upcoming appointments, etc.  Non-urgent messages can be sent to your provider as well.   To learn more about what you can do with MyChart, go to [email or web address].    Your next appointment:   6 month(s)  Provider:   K. Italy Hilty, MD

## 2022-09-17 NOTE — Progress Notes (Signed)
Cardiology Office Note:  .   Date:  09/17/2022  ID:  Holly Best, DOB [birth year], MRN [id number] PCP: Estanislado Pandy, MD   HeartCare Providers Cardiologist:  Chrystie Nose, MD    Patient Profile: .      PMH CAD Coronary CTA 09/05/2019 Minimal nonobstructive CAD primarily of LAD CAC score of 65 (69th percentile) Normal coronary origin with right dominance Breast cancer Radiation therapy 2018 Dyslipidemia Statin myalgia Chronic back pain On narcotics Anxiety Elevated coronary calcium score Pulmonary hypertension Mitral valve disease Mild MR on TTE 08/2019 No evidence of MR on TTE 04/16/2021 Aortic valve disease TTE 08/09/2019 EF 70-75% Mild AI Moderately sclerotic to mildly stenotic AV with mean gradient 11.0 mmHg TTE 04/16/2021 EF 60-65% Mild AI Aortic valve sclerosis without any evidence of stenosis with AV mean gradient 8.0 mmHg AVA underestimated due to small LVOT measurement  She was referred to cardiology and seen by Dr. Rennis Golden 08/02/2019 for shortness of breath and chest discomfort.  She reported over the previous year having more shortness of breath and chest discomfort which sometimes radiates to her neck.  Also feels intermittent palpitations.  Strong family history of heart disease with 3 brothers who had strokes in her father with heart disease.  Known history of dyslipidemia but only able to tolerate low-dose atorvastatin 5 mg 3 days weekly due to myalgias.  At that time her most recent lipid profile 07/2019 showed total cholesterol of 239, HDL of 60, triglycerides 145, and LDL 180.  History of breast cancer on Arimidex.  She has chronic pain for which she takes oxycodone and has anxiety treated with Paxil and lorazepam.  She describes shortness of breath with exertion which is worse walking up hills or doing certain activities with her horses.  No having chest discomfort described as tightness or squeezing at rest but also with exertion. She underwent  cardiac testing as noted above. Echo revealed mild aortic insufficiency and coronary CT revealed mild nonobstructive CAD.  Dr. Rennis Golden noted that her echocardiogram revealed right atrial pressure elevated at 8 mmHg and unfortunately there was not enough tricuspid regurgitation to measure pulmonary pressures, however her pulmonary arteries were noted to be dilated on CT suggestive of pulmonary hypertension.  She was started on Repatha with significant improvement in lipids with LDL down to 44.       History of Present Illness: .   Holly Best is a very pleasant 72 y.o. female who is here today with her husband. She reports she is feeling well.  She has chronic back pain for which she takes narcotics but remains as active as she can. 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 She does not monitor BP routinely.  She has occasional chest discomfort more frequently on the right in the location of a prior right breast lumpectomy. Pain has been present for years and does not intensify with exertion. She reports she gets out of breath at times when using the rake or the shovel. This does not occur every time she exerts herself and she recovers quickly when she stops to rest. No orthopnea, PND, edema, palpitations, presyncope, syncope. Admits to "country cooking" - pork chops and gravy, biscuits. Cooks her vegetables with olive oil. She asks about random episodes of sore throat as side effect of Repatha. We discussed potential side effects and she does not think it is related to timing of administration.   ROS: See HPI  Studies Reviewed: Marland Kitchen   EKG Interpretation Date/Time:  Wednesday September 17 2022 13:45:07 EDT Ventricular Rate:  65 PR Interval:  140 QRS Duration:  80 QT Interval:  420 QTC Calculation: 436 R Axis:   55  Text Interpretation: Normal sinus rhythm Normal ECG When compared with ECG of 15-Oct-2017 11:50, No significant change was found No ST abnormality  Confirmed by Eligha Bridegroom [phone]) on 09/17/2022 2:09:15 PM    Risk Assessment/Calculations:             Physical Exam:   VS:  BP 130/84   Pulse 65   Ht 5\' 6"  (1.676 m)   Wt 147 lb 9.6 oz (67 kg)   SpO2 96%   BMI 23.82 kg/m    Wt Readings from Last 3 Encounters:  09/17/22 147 lb 9.6 oz (67 kg)  10/01/21 148 lb 14.4 oz (67.5 kg)  03/28/21 147 lb (66.7 kg)    GEN: Well nourished, well developed in no acute distress NECK: No JVD; No carotid bruits CARDIAC: RRR, no murmurs, rubs, gallops RESPIRATORY:  Clear to auscultation without rales, wheezing or rhonchi  ABDOMEN: Soft, non-tender, non-distended EXTREMITIES:  No edema; No deformity     ASSESSMENT AND PLAN: .    Dyslipidemia LDL goal < 70: LDL 37, HDL 90, triglycerides 75 on 09/16/22. Well-controlled on Repatha. She is tolerating the medication without any concerning side effects.   CAD without angina: Minimal mixed 1 to 24% ostial stenosis in LAD, 1 to 24% stenotic proximal calcified plaque on coronary CT 09/05/2019. She has occasional right sided chest pain that is atypical for angina. Occasional shortness of breath that occurs with moderate to heavy exertion but does not occur consistently. Encouraged her to ensure good hydration and continue to monitor.  No indication for further ischemia evaluation at this time. Cholesterol is well-controlled. Encouraged secondary prevention including 150 minutes of moderate intensity exercise each week as well as heart healthy diet avoiding saturated fat, sugar, processed foods, and simple carbohydrates.  Aortic valve disease: Echo 04/16/21 revealed mild aortic valve regurgitation, AV calcification without evidence of stenosis, with AVA felt to be underestimated due to small LVOT measurement. Aortic valve mean gradient 8.0 mmHg.  Compared to previous echo 08/2019, mean AVG has decreased and no aortic stenosis present. She is asymptomatic.  We will continue to monitor clinically at this time and see  her back in 6 months for close follow-up.       Dispo: 6 months with Dr. Rennis Golden or APP  Signed, Eligha Bridegroom, NP-C

## 2022-09-18 ENCOUNTER — Encounter: Payer: Self-pay | Admitting: Hematology and Oncology

## 2022-09-18 ENCOUNTER — Inpatient Hospital Stay
Payer: Federal, State, Local not specified - PPO | Attending: Hematology and Oncology | Admitting: Hematology and Oncology

## 2022-09-18 DIAGNOSIS — Z170 Estrogen receptor positive status [ER+]: Secondary | ICD-10-CM | POA: Diagnosis not present

## 2022-09-18 DIAGNOSIS — C50411 Malignant neoplasm of upper-outer quadrant of right female breast: Secondary | ICD-10-CM

## 2022-09-18 NOTE — Progress Notes (Signed)
[facility]  [phone] [phone]     ID: Holly Best DOB: 01-Jul-1950  MR#: [id number]  DGL#:[id number]  Patient Care Team: Estanislado Pandy, MD as PCP - General (Cardiology) Rennis Golden Lisette Abu, MD as PCP - Cardiology (Cardiology) Griselda Miner, MD as Consulting Physician (General Surgery) Magrinat, Valentino Hue, MD (Inactive) as Consulting Physician (Oncology) Lonie Peak, MD as Attending Physician (Radiation Oncology) Jene Every, MD as Consulting Physician (Orthopedic Surgery) Marcelino Duster, MD as Referring Physician (Dermatology) Sheran Luz, MD as Consulting Physician (Physical Medicine and Rehabilitation) Redgie Grayer, MD as Referring Physician (Internal Medicine) Arman Bogus, MD as Consulting Physician (Neurosurgery) Janalyn Harder, MD (Inactive) as Consulting Physician (Dermatology)  CHIEF COMPLAINT: Estrogen receptor positive breast cancer  CURRENT TREATMENT:  Observation.  INTERVAL HISTORY:  Patient is here for a telephone visit.  Since her last visit here, she had a mammogram which recommended a follow-up 69-month mammogram for some dystrophic calcifications at the lumpectomy site.  Other than that she was also wondering if she had to do another bone density scan.  She otherwise has been examining her breast, denies any changes.  No other review of systems pertinent on our discussion today.   COVID 19 VACCINATION STATUS: Status post Moderna times 2 plus booster in November.    BREAST CANCER HISTORY:  From the original intake note:  Holly Best had screening mammography July 2018 showing breast density category C, and a possible mass in the upper-outer quadrant of the right breast and calcifications in the lower inner quadrant of the left breast. On 07/17/2016 she underwent bilateral diagnostic mammography with ultrasonography and [facility]. Breast density was category C. In the  left breast, a group of coarse calcifications in the lower inner quadrant measuring 1.8 cm were felt to be benign.  In the right breast there was a spiculated mass in the upper-outer quadrant which was palpable as a 2.5 cm area of thickening at the 11:00 position of the right breast. There was no palpable right axillary adenopathy. Ultrasonography of the right breast mass confirmed a 0.8 cm hypoechoic and irregular mass at the 10:30-11:00 position 6 cm from the nipple. Ultrasound of the right axilla was sonographically benign.  Biopsy of the right breast mass in question 07/22/2016 showed ([id number]) and invasive ductal carcinoma, grade 1, estrogen receptor 100% positive, progesterone receptor 90% positive, both with strong staining intensity, with an MIB-1 of 5%, and HER-2 not amplified with a signals ratio of 1.15 and the number per cell 2.25.  The patient's subsequent history is as detailed below.   PAST MEDICAL HISTORY: Past Medical History:  Diagnosis Date   Cancer [facility])    right breast cancer   Crohn disease (HCC)    back in 1991, Remicaid in 2002 and no longer has problems with this   Depression    Family history of breast cancer    History of radiation therapy 09/10/16- 10/08/16   Right Breast 2.67 Gy in 15 fractions, Right Breast boost, 2 Gy in 5 fractions.    Hypercholesteremia    Personal history of radiation therapy     PAST SURGICAL HISTORY: Past Surgical History:  Procedure Laterality Date   BREAST LUMPECTOMY Right 2018   BREAST LUMPECTOMY WITH RADIOACTIVE SEED AND SENTINEL LYMPH NODE BIOPSY Right 08/06/2016   Procedure: BREAST LUMPECTOMY WITH RADIOACTIVE SEED AND SENTINEL LYMPH NODE BIOPSY;  Surgeon: Griselda Miner, MD;  Location: [facility] OR;  Service: General;  Laterality:  Right;   COLONOSCOPY     dental implants     upper   LUMBAR LAMINECTOMY Bilateral 03/23/2013   Procedure: MICRO LUMBAR DECOMPRESSION, FORAMINOTOMY L4-5;  Surgeon: Javier Docker, MD;  Location: [facility] ORS;   Service: Orthopedics;  Laterality: Bilateral;   POSTERIOR LUMBAR FUSION  10/16/2017    Posterior Lumbar Interbody Fusion - Lumbar Two-Lumbar Three -     FAMILY HISTORY Family History  Problem Relation Age of Onset   Breast cancer Sister 93   Breast cancer Other 58   Lung cancer Mother    Lung cancer Father    Breast cancer Maternal Grandmother        dx in her 56s   Other Brother        non-malignant spinal tumor   Breast cancer Maternal Aunt        dx > 50   Lupus Maternal Aunt    Breast cancer Maternal Aunt        dx >50  The patient's father died from lung cancer at age 69. The patient's mother also died from lung cancer at age 73. The patient has 3 brothers, one sister. The patient's sister was diagnosed with breast cancer at age 73. The patient had a niece with breast cancer (not her sister's daughter) at age 59.    GYNECOLOGIC HISTORY:  No LMP recorded. Patient is postmenopausal. Menarche age 58, first live birth age 2 the patient is GX P1. She went through the change of life approximately age 75. She did not take hormone replacement. She never used oral contraceptives.   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 Her husband Holly Best") worked in Designer, fashion/clothing. Their son lives in [place] and works for [employer]. The patient has 2 grandchildren. They attend a nondenominational church in [place].     ADVANCED DIRECTIVES: In place   HEALTH MAINTENANCE: Social History   Tobacco Use   Smoking status: Former    Current packs/day: 0.00    Average packs/day: 1 pack/day for 10.0 years (10.0 ttl pk-yrs)    Types: Cigarettes    Start date: 02/18/1968    Quit date: 02/17/1978    Years since quitting: 44.6   Smokeless tobacco: Never  Vaping Use   Vaping status: Never Used  Substance Use Topics   Alcohol use: Yes    Comment: occassionally, on weekends   Drug use: No     Colonoscopy: 2018  PAP:  Bone  density:   No Known Allergies  Current Outpatient Medications  Medication Sig Dispense Refill   calcium carbonate (TUMS - DOSED IN MG ELEMENTAL CALCIUM) 500 MG chewable tablet Chew 500 mg by mouth at bedtime.      Evolocumab (REPATHA SURECLICK) 140 MG/ML SOAJ Inject 140 mg into the skin every 14 (fourteen) days. 2 mL 11   LORazepam (ATIVAN) 1 MG tablet Take 1 mg by mouth at bedtime.     naloxone (NARCAN) 4 MG/0.1ML LIQD nasal spray kit Narcan 4 mg/actuation nasal spray  USE 1 SPRAY IN ONE NOSTRIL. REPEAT AFTER 3 MINUTES IF NO OR MINIMAL RESPONSE. CALL 911 AFTER DOSE IS GIVEN.     oxyCODONE-acetaminophen (PERCOCET/ROXICET) 5-325 MG tablet Take 1 tablet by mouth every 6 (six) hours as needed for severe pain.     PARoxetine (PAXIL) 20 MG tablet Take 20 mg by mouth every evening.     No current facility-administered medications for this visit.    OBJECTIVE:  There were no vitals filed for this visit.   There is no height or weight on file to calculate BMI.   Wt Readings from Last 3 Encounters:  09/17/22 147 lb 9.6 oz (67 kg)  10/01/21 148 lb 14.4 oz (67.5 kg)  03/28/21 147 lb (66.7 kg)   ECOG FS:2 - Symptomatic, <50% confined to bed   Physical exam deferred, telephone visit LAB RESULTS:  CMP     Component Value Date/Time   NA 143 10/21/2019 1137   NA 140 08/30/2019 1324   NA 142 12/08/2016 0942   K 4.0 10/21/2019 1137   K 3.8 12/08/2016 0942   CL 106 10/21/2019 1137   CO2 31 10/21/2019 1137   CO2 27 12/08/2016 0942   GLUCOSE 89 10/21/2019 1137   GLUCOSE 83 12/08/2016 0942   BUN 22 10/21/2019 1137   BUN 16 08/30/2019 1324   BUN 14.3 12/08/2016 0942   CREATININE 0.87 10/21/2019 1137   CREATININE 0.82 07/20/2017 1056   CREATININE 0.9 12/08/2016 0942   CALCIUM 9.4 10/21/2019 1137   CALCIUM 9.9 12/08/2016 0942   PROT 6.6 10/21/2019 1137   PROT 7.3 12/08/2016 0942   ALBUMIN 3.8 10/21/2019 1137   ALBUMIN 4.3 12/08/2016 0942   AST 13 (L) 10/21/2019 1137   AST 17  07/20/2017 1056   AST 18 12/08/2016 0942   ALT 8 10/21/2019 1137   ALT 15 07/20/2017 1056   ALT 16 12/08/2016 0942   ALKPHOS 110 10/21/2019 1137   ALKPHOS 82 12/08/2016 0942   BILITOT 0.9 10/21/2019 1137   BILITOT 0.9 07/20/2017 1056   BILITOT 1.02 12/08/2016 0942   GFRNONAA >60 10/21/2019 1137   GFRNONAA >60 07/20/2017 1056   GFRAA 94 08/30/2019 1324   GFRAA >60 07/20/2017 1056    No results found for: "TOTALPROTELP", "ALBUMINELP", "A1GS", "A2GS", "BETS", "BETA2SER", "GAMS", "MSPIKE", "SPEI"  No results found for: "KPAFRELGTCHN", "LAMBDASER", "KAPLAMBRATIO"  Lab Results  Component Value Date   WBC 7.2 10/21/2019   NEUTROABS 4.8 10/21/2019   HGB 13.1 10/21/2019   HCT 39.8 10/21/2019   MCV 101.0 (H) 10/21/2019   PLT 299 10/21/2019      Chemistry      Component Value Date/Time   NA 143 10/21/2019 1137   NA 140 08/30/2019 1324   NA 142 12/08/2016 0942   K 4.0 10/21/2019 1137   K 3.8 12/08/2016 0942   CL 106 10/21/2019 1137   CO2 31 10/21/2019 1137   CO2 27 12/08/2016 0942   BUN 22 10/21/2019 1137   BUN 16 08/30/2019 1324   BUN 14.3 12/08/2016 0942   CREATININE 0.87 10/21/2019 1137   CREATININE 0.82 07/20/2017 1056   CREATININE 0.9 12/08/2016 0942      Component Value Date/Time   CALCIUM 9.4 10/21/2019 1137   CALCIUM 9.9 12/08/2016 0942   ALKPHOS 110 10/21/2019 1137   ALKPHOS 82 12/08/2016 0942   AST 13 (L) 10/21/2019 1137   AST 17 07/20/2017 1056   AST 18 12/08/2016 0942   ALT 8 10/21/2019 1137   ALT 15 07/20/2017 1056   ALT 16 12/08/2016 0942   BILITOT 0.9 10/21/2019 1137   BILITOT 0.9 07/20/2017 1056   BILITOT 1.02 12/08/2016 0942       No results found for: "LABCA2"  No components found for: "[id number]"  No results for input(s): "INR" in the last 168 hours.  Urinalysis No results found for: "COLORURINE", "APPEARANCEUR", "LABSPEC", "PHURINE", "GLUCOSEU", "HGBUR", "BILIRUBINUR", "KETONESUR", "PROTEINUR", "UROBILINOGEN", "NITRITE",  "LEUKOCYTESUR"   STUDIES:  MM 3D DIAGNOSTIC MAMMOGRAM UNILATERAL RIGHT BREAST  Result Date: 09/15/2022 CLINICAL DATA:  72 year old female presenting for short-term follow-up of probably benign right breast calcifications at the patient's lumpectomy site. History of right breast cancer status post lumpectomy in 2018. EXAM: DIGITAL DIAGNOSTIC UNILATERAL RIGHT MAMMOGRAM WITH TOMOSYNTHESIS AND CAD TECHNIQUE: Right digital diagnostic mammography and breast tomosynthesis was performed. The images were evaluated with computer-aided detection. COMPARISON:  Previous exam(s). ACR Breast Density Category c: The breasts are heterogeneously dense, which may obscure small masses. FINDINGS: Spot 2D magnification views of the right breast were performed in addition to standard views. There has been interval coarsening of calcifications at the lumpectomy site which most likely represent dystrophic calcifications associated with fat necrosis. No new suspicious calcifications, mass, or distortion. IMPRESSION: Probably benign calcifications at the lumpectomy site in the right breast, likely developing dystrophic. RECOMMENDATION: Diagnostic bilateral mammogram in 6 months. I have discussed the findings and recommendations with the patient. If applicable, a reminder letter will be sent to the patient regarding the next appointment. BI-RADS CATEGORY  3: Probably benign. Electronically Signed   By: Emmaline Kluver M.D.   On: 09/15/2022 11:05     ELIGIBLE FOR AVAILABLE RESEARCH PROTOCOL: no   ASSESSMENT: 72 y.o. Holly Best, [place] woman status post right breast upper outer quadrant biopsy 07/22/2016 for a clinical T1b N0, stage IA invasive ductal carcinoma, grade 1, estrogen and progesterone receptor positive, HER-2 nonamplified, with an MIB-1 of 5%.  (1) Right lumpectomy 08/06/2016 showed a pT1b pN0, stage IA invasive ductal carcinoma, grade 1, with negative margins.    (2) Genetics testing on 08/19/2016 revealed no deleterious  mutations APC, ATM, AXIN2, BARD1, BMPR1A, BRCA1, BRCA2, BRIP1, CDH1, CDKN2A (p14ARF), CDKN2A (p16INK4a), CHEK2, CTNNA1, DICER1, EPCAM (Deletion/duplication testing only), GREM1 (promoter region deletion/duplication testing only), KIT, MEN1, MLH1, MSH2, MSH3, MSH6, MUTYH, NBN, NF1, NHTL1, PALB2, PDGFRA, PMS2, POLD1, POLE, PTEN, RAD50, RAD51C, RAD51D, SDHB, SDHC, SDHD, SMAD4, SMARCA4. STK11, TP53, TSC1, TSC2, and VHL.  The following genes were evaluated for sequence changes only: SDHA and HOXB13 c.251G>A variant only.   (3) Oncotype DX: Score of 16 predicts a 10-year risk of recurrence outside the breast of 10% of the patient's only systemic therapy is tamoxifen for 5 years.  It also predicts no benefit from chemotherapy.  (4) adjuvant radiation 09/10/2016-10/08/2016  Site/dose:   1. Right breast, 2.67 Gy in 15 fractions                         2. Boost, 2 Gy in 5 fractions  (5) Anastrozole started 11/2016, held October 2021 with multiple symptoms  (a) bone density 11/10/2016 showed a T score of -1.0 (normal)  (6) tamoxifen started 01/17/2019 to present   PLAN: Patient is here for a telephone visit.  She has completed 5 years of antiestrogen therapy.  Her most recent mammogram recommended a follow-up mammogram in 6 months given dystrophic calcifications noted at the lumpectomy site.  I have reviewed these results in detail with the patient today.  We have also discussed about bone density, patient was wondering if she can get another bone density scan.  I have ordered this today. She will otherwise continue self breast exam and report any changes to [place].  She will otherwise return to clinic in approximately 1 year.  I connected with  Holly Best on 09/18/22 by a telephone application and verified that I am speaking with the correct person using two identifiers.   I discussed  the limitations of evaluation and management by telemedicine. The patient expressed understanding and agreed to  proceed.  Total time spent: 8 min *Total Encounter Time as defined by the Centers for Medicare and Medicaid Services includes, in addition to the face-to-face time of a patient visit (documented in the note above) non-face-to-face time: obtaining and reviewing outside history, ordering and reviewing medications, tests or procedures, care coordination (communications with other health care professionals or caregivers) and documentation in the medical record.

## 2022-10-02 ENCOUNTER — Ambulatory Visit: Payer: Federal, State, Local not specified - PPO | Admitting: Hematology and Oncology

## 2022-10-02 NOTE — Progress Notes (Deleted)
[facility]  [phone] [phone]     ID: Holly Best DOB: 24-Jul-1950  MR#: [id number]  HQI#:[id number]  Patient Care Team: Estanislado Pandy, MD as PCP - General (Cardiology) Rennis Golden Lisette Abu, MD as PCP - Cardiology (Cardiology) Griselda Miner, MD as Consulting Physician (General Surgery) Magrinat, Valentino Hue, MD (Inactive) as Consulting Physician (Oncology) Lonie Peak, MD as Attending Physician (Radiation Oncology) Jene Every, MD as Consulting Physician (Orthopedic Surgery) Marcelino Duster, MD as Referring Physician (Dermatology) Sheran Luz, MD as Consulting Physician (Physical Medicine and Rehabilitation) Redgie Grayer, MD as Referring Physician (Internal Medicine) Arman Bogus, MD as Consulting Physician (Neurosurgery) Janalyn Harder, MD (Inactive) as Consulting Physician (Dermatology)  CHIEF COMPLAINT: Estrogen receptor positive breast cancer  CURRENT TREATMENT:  Observation.  INTERVAL HISTORY:  Patient is here for a telephone visit.  Since her last visit here, she had a mammogram which recommended a follow-up 61-month mammogram for some dystrophic calcifications at the lumpectomy site.  Other than that she was also wondering if she had to do another bone density scan.  She otherwise has been examining her breast, denies any changes.  No other review of systems pertinent on our discussion today.   COVID 19 VACCINATION STATUS: Status post Moderna times 2 plus booster in November.    BREAST CANCER HISTORY:  From the original intake note:  Holly Best had screening mammography July 2018 showing breast density category C, and a possible mass in the upper-outer quadrant of the right breast and calcifications in the lower inner quadrant of the left breast. On 07/17/2016 she underwent bilateral diagnostic mammography with ultrasonography and [facility]. Breast density was category C. In the  left breast, a group of coarse calcifications in the lower inner quadrant measuring 1.8 cm were felt to be benign.  In the right breast there was a spiculated mass in the upper-outer quadrant which was palpable as a 2.5 cm area of thickening at the 11:00 position of the right breast. There was no palpable right axillary adenopathy. Ultrasonography of the right breast mass confirmed a 0.8 cm hypoechoic and irregular mass at the 10:30-11:00 position 6 cm from the nipple. Ultrasound of the right axilla was sonographically benign.  Biopsy of the right breast mass in question 07/22/2016 showed ([id number]) and invasive ductal carcinoma, grade 1, estrogen receptor 100% positive, progesterone receptor 90% positive, both with strong staining intensity, with an MIB-1 of 5%, and HER-2 not amplified with a signals ratio of 1.15 and the number per cell 2.25.  The patient's subsequent history is as detailed below.   PAST MEDICAL HISTORY: Past Medical History:  Diagnosis Date   Cancer [facility])    right breast cancer   Crohn disease (HCC)    back in 1991, Remicaid in 2002 and no longer has problems with this   Depression    Family history of breast cancer    History of radiation therapy 09/10/16- 10/08/16   Right Breast 2.67 Gy in 15 fractions, Right Breast boost, 2 Gy in 5 fractions.    Hypercholesteremia    Personal history of radiation therapy     PAST SURGICAL HISTORY: Past Surgical History:  Procedure Laterality Date   BREAST LUMPECTOMY Right 2018   BREAST LUMPECTOMY WITH RADIOACTIVE SEED AND SENTINEL LYMPH NODE BIOPSY Right 08/06/2016   Procedure: BREAST LUMPECTOMY WITH RADIOACTIVE SEED AND SENTINEL LYMPH NODE BIOPSY;  Surgeon: Griselda Miner, MD;  Location: [facility];  Service: General;  Laterality:  Right;   COLONOSCOPY     dental implants     upper   LUMBAR LAMINECTOMY Bilateral 03/23/2013   Procedure: MICRO LUMBAR DECOMPRESSION, FORAMINOTOMY L4-5;  Surgeon: Javier Docker, MD;  Location: [facility];   Service: Orthopedics;  Laterality: Bilateral;   POSTERIOR LUMBAR FUSION  10/16/2017    Posterior Lumbar Interbody Fusion - Lumbar Two-Lumbar Three -     FAMILY HISTORY Family History  Problem Relation Age of Onset   Breast cancer Sister 12   Breast cancer Other 35   Lung cancer Mother    Lung cancer Father    Breast cancer Maternal Grandmother        dx in her 43s   Other Brother        non-malignant spinal tumor   Breast cancer Maternal Aunt        dx > 50   Lupus Maternal Aunt    Breast cancer Maternal Aunt        dx >50  The patient's father died from lung cancer at age 36. The patient's mother also died from lung cancer at age 55. The patient has 3 brothers, one sister. The patient's sister was diagnosed with breast cancer at age 4. The patient had a niece with breast cancer (not her sister's daughter) at age 68.    GYNECOLOGIC HISTORY:  No LMP recorded. Patient is postmenopausal. Menarche age 82, first live birth age 41 the patient is GX P1. She went through the change of life approximately age 26. She did not take hormone replacement. She never used oral contraceptives.   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 Her husband Lorenda Hatchet") worked in Designer, fashion/clothing. Their son lives in [place] and works for [employer]. The patient has 2 grandchildren. They attend a nondenominational church in [place].     ADVANCED DIRECTIVES: In place   HEALTH MAINTENANCE: Social History   Tobacco Use   Smoking status: Former    Current packs/day: 0.00    Average packs/day: 1 pack/day for 10.0 years (10.0 ttl pk-yrs)    Types: Cigarettes    Start date: 02/18/1968    Quit date: 02/17/1978    Years since quitting: 44.6   Smokeless tobacco: Never  Vaping Use   Vaping status: Never Used  Substance Use Topics   Alcohol use: Yes    Comment: occassionally, on weekends   Drug use: No     Colonoscopy: 2018  PAP:  Bone  density:   No Known Allergies  Current Outpatient Medications  Medication Sig Dispense Refill   calcium carbonate (TUMS - DOSED IN MG ELEMENTAL CALCIUM) 500 MG chewable tablet Chew 500 mg by mouth at bedtime.      Evolocumab (REPATHA SURECLICK) 140 MG/ML SOAJ Inject 140 mg into the skin every 14 (fourteen) days. 2 mL 11   LORazepam (ATIVAN) 1 MG tablet Take 1 mg by mouth at bedtime.     naloxone (NARCAN) 4 MG/0.1ML LIQD nasal spray kit Narcan 4 mg/actuation nasal spray  USE 1 SPRAY IN ONE NOSTRIL. REPEAT AFTER 3 MINUTES IF NO OR MINIMAL RESPONSE. CALL 911 AFTER DOSE IS GIVEN.     oxyCODONE-acetaminophen (PERCOCET/ROXICET) 5-325 MG tablet Take 1 tablet by mouth every 6 (six) hours as needed for severe pain.     PARoxetine (PAXIL) 20 MG tablet Take 20 mg by mouth every evening.     No current facility-administered medications for this visit.    OBJECTIVE:  There were no vitals filed for this visit.   There is no height or weight on file to calculate BMI.   Wt Readings from Last 3 Encounters:  09/17/22 147 lb 9.6 oz (67 kg)  10/01/21 148 lb 14.4 oz (67.5 kg)  03/28/21 147 lb (66.7 kg)   ECOG FS:2 - Symptomatic, <50% confined to bed   Physical exam deferred, telephone visit LAB RESULTS:  CMP     Component Value Date/Time   NA 143 10/21/2019 1137   NA 140 08/30/2019 1324   NA 142 12/08/2016 0942   K 4.0 10/21/2019 1137   K 3.8 12/08/2016 0942   CL 106 10/21/2019 1137   CO2 31 10/21/2019 1137   CO2 27 12/08/2016 0942   GLUCOSE 89 10/21/2019 1137   GLUCOSE 83 12/08/2016 0942   BUN 22 10/21/2019 1137   BUN 16 08/30/2019 1324   BUN 14.3 12/08/2016 0942   CREATININE 0.87 10/21/2019 1137   CREATININE 0.82 07/20/2017 1056   CREATININE 0.9 12/08/2016 0942   CALCIUM 9.4 10/21/2019 1137   CALCIUM 9.9 12/08/2016 0942   PROT 6.6 10/21/2019 1137   PROT 7.3 12/08/2016 0942   ALBUMIN 3.8 10/21/2019 1137   ALBUMIN 4.3 12/08/2016 0942   AST 13 (L) 10/21/2019 1137   AST 17  07/20/2017 1056   AST 18 12/08/2016 0942   ALT 8 10/21/2019 1137   ALT 15 07/20/2017 1056   ALT 16 12/08/2016 0942   ALKPHOS 110 10/21/2019 1137   ALKPHOS 82 12/08/2016 0942   BILITOT 0.9 10/21/2019 1137   BILITOT 0.9 07/20/2017 1056   BILITOT 1.02 12/08/2016 0942   GFRNONAA >60 10/21/2019 1137   GFRNONAA >60 07/20/2017 1056   GFRAA 94 08/30/2019 1324   GFRAA >60 07/20/2017 1056    No results found for: "TOTALPROTELP", "ALBUMINELP", "A1GS", "A2GS", "BETS", "BETA2SER", "GAMS", "MSPIKE", "SPEI"  No results found for: "KPAFRELGTCHN", "LAMBDASER", "KAPLAMBRATIO"  Lab Results  Component Value Date   WBC 7.2 10/21/2019   NEUTROABS 4.8 10/21/2019   HGB 13.1 10/21/2019   HCT 39.8 10/21/2019   MCV 101.0 (H) 10/21/2019   PLT 299 10/21/2019      Chemistry      Component Value Date/Time   NA 143 10/21/2019 1137   NA 140 08/30/2019 1324   NA 142 12/08/2016 0942   K 4.0 10/21/2019 1137   K 3.8 12/08/2016 0942   CL 106 10/21/2019 1137   CO2 31 10/21/2019 1137   CO2 27 12/08/2016 0942   BUN 22 10/21/2019 1137   BUN 16 08/30/2019 1324   BUN 14.3 12/08/2016 0942   CREATININE 0.87 10/21/2019 1137   CREATININE 0.82 07/20/2017 1056   CREATININE 0.9 12/08/2016 0942      Component Value Date/Time   CALCIUM 9.4 10/21/2019 1137   CALCIUM 9.9 12/08/2016 0942   ALKPHOS 110 10/21/2019 1137   ALKPHOS 82 12/08/2016 0942   AST 13 (L) 10/21/2019 1137   AST 17 07/20/2017 1056   AST 18 12/08/2016 0942   ALT 8 10/21/2019 1137   ALT 15 07/20/2017 1056   ALT 16 12/08/2016 0942   BILITOT 0.9 10/21/2019 1137   BILITOT 0.9 07/20/2017 1056   BILITOT 1.02 12/08/2016 0942       No results found for: "LABCA2"  No components found for: "[id number]"  No results for input(s): "INR" in the last 168 hours.  Urinalysis No results found for: "COLORURINE", "APPEARANCEUR", "LABSPEC", "PHURINE", "GLUCOSEU", "HGBUR", "BILIRUBINUR", "KETONESUR", "PROTEINUR", "UROBILINOGEN", "NITRITE",  "LEUKOCYTESUR"   STUDIES:  MM 3D DIAGNOSTIC MAMMOGRAM UNILATERAL RIGHT BREAST  Result Date: 09/15/2022 CLINICAL DATA:  72 year old female presenting for short-term follow-up of probably benign right breast calcifications at the patient's lumpectomy site. History of right breast cancer status post lumpectomy in 2018. EXAM: DIGITAL DIAGNOSTIC UNILATERAL RIGHT MAMMOGRAM WITH TOMOSYNTHESIS AND CAD TECHNIQUE: Right digital diagnostic mammography and breast tomosynthesis was performed. The images were evaluated with computer-aided detection. COMPARISON:  Previous exam(s). ACR Breast Density Category c: The breasts are heterogeneously dense, which may obscure small masses. FINDINGS: Spot 2D magnification views of the right breast were performed in addition to standard views. There has been interval coarsening of calcifications at the lumpectomy site which most likely represent dystrophic calcifications associated with fat necrosis. No new suspicious calcifications, mass, or distortion. IMPRESSION: Probably benign calcifications at the lumpectomy site in the right breast, likely developing dystrophic. RECOMMENDATION: Diagnostic bilateral mammogram in 6 months. I have discussed the findings and recommendations with the patient. If applicable, a reminder letter will be sent to the patient regarding the next appointment. BI-RADS CATEGORY  3: Probably benign. Electronically Signed   By: Emmaline Kluver M.D.   On: 09/15/2022 11:05     ELIGIBLE FOR AVAILABLE RESEARCH PROTOCOL: no   ASSESSMENT: 72 y.o. [place], [place] woman status post right breast upper outer quadrant biopsy 07/22/2016 for a clinical T1b N0, stage IA invasive ductal carcinoma, grade 1, estrogen and progesterone receptor positive, HER-2 nonamplified, with an MIB-1 of 5%.  (1) Right lumpectomy 08/06/2016 showed a pT1b pN0, stage IA invasive ductal carcinoma, grade 1, with negative margins.    (2) Genetics testing on 08/19/2016 revealed no deleterious  mutations APC, ATM, AXIN2, BARD1, BMPR1A, BRCA1, BRCA2, BRIP1, CDH1, CDKN2A (p14ARF), CDKN2A (p16INK4a), CHEK2, CTNNA1, DICER1, EPCAM (Deletion/duplication testing only), GREM1 (promoter region deletion/duplication testing only), KIT, MEN1, MLH1, MSH2, MSH3, MSH6, MUTYH, NBN, NF1, NHTL1, PALB2, PDGFRA, PMS2, POLD1, POLE, PTEN, RAD50, RAD51C, RAD51D, SDHB, SDHC, SDHD, SMAD4, SMARCA4. STK11, TP53, TSC1, TSC2, and VHL.  The following genes were evaluated for sequence changes only: SDHA and HOXB13 c.251G>A variant only.   (3) Oncotype DX: Score of 16 predicts a 10-year risk of recurrence outside the breast of 10% of the patient's only systemic therapy is tamoxifen for 5 years.  It also predicts no benefit from chemotherapy.  (4) adjuvant radiation 09/10/2016-10/08/2016  Site/dose:   1. Right breast, 2.67 Gy in 15 fractions                         2. Boost, 2 Gy in 5 fractions  (5) Anastrozole started 11/2016, held October 2021 with multiple symptoms  (a) bone density 11/10/2016 showed a T score of -1.0 (normal)  (6) tamoxifen started 01/17/2019 to present   PLAN: Patient is here for a telephone visit.  She has completed 5 years of antiestrogen therapy.  Her most recent mammogram recommended a follow-up mammogram in 6 months given dystrophic calcifications noted at the lumpectomy site.  I have reviewed these results in detail with the patient today.  We have also discussed about bone density, patient was wondering if she can get another bone density scan.  I have ordered this today. She will otherwise continue self breast exam and report any changes to [place].  She will otherwise return to clinic in approximately 1 year.  I connected with  Gerri Spore on 10/02/22 by a telephone application and verified that I am speaking with the correct person using two identifiers.   I discussed  the limitations of evaluation and management by telemedicine. The patient expressed understanding and agreed to  proceed.  Total time spent: 8 min *Total Encounter Time as defined by the Centers for Medicare and Medicaid Services includes, in addition to the face-to-face time of a patient visit (documented in the note above) non-face-to-face time: obtaining and reviewing outside history, ordering and reviewing medications, tests or procedures, care coordination (communications with other health care professionals or caregivers) and documentation in the medical record.

## 2023-03-18 ENCOUNTER — Ambulatory Visit (HOSPITAL_BASED_OUTPATIENT_CLINIC_OR_DEPARTMENT_OTHER): Payer: Federal, State, Local not specified - PPO | Admitting: Internal Medicine

## 2023-03-18 VITALS — BP 122/68 | HR 75 | Ht 66.0 in | Wt 155.7 lb

## 2023-03-18 DIAGNOSIS — E7801 Familial hypercholesterolemia: Secondary | ICD-10-CM

## 2023-03-18 DIAGNOSIS — I2510 Atherosclerotic heart disease of native coronary artery without angina pectoris: Secondary | ICD-10-CM | POA: Diagnosis not present

## 2023-03-18 DIAGNOSIS — E785 Hyperlipidemia, unspecified: Secondary | ICD-10-CM

## 2023-03-18 NOTE — Progress Notes (Signed)
 OFFICE CONSULT NOTE  Chief Complaint:  Follow-up  Primary Care Physician: Estanislado Pandy, MD  HPI:  Holly Best is a 73 y.o. female who is being seen today for the evaluation of shortness of breath and chest discomfort at the request of Sasser, Clarene Critchley, MD.  This is a pleasant 73 year old female whose husband is a patient of mine.  Over the past year or so she has been experiencing more frequent shortness of breath and chest discomfort which sometimes radiates to her neck.  She also feels intermittent palpitations.  She reports a strong family history of heart disease in fact she had three brothers who had stroke and a father who had heart disease as well.  She reports longstanding dyslipidemia but can only tolerate low-dose atorvastatin 5 mg three times a week due to myalgias.  More recently her lipid profile in July 2021 showed total cholesterol of 239, HDL of 60, triglycerides 145 and LDL of 180.  She has a history of breast cancer and is on Arimidex.  Also has chronic pain for which she takes oxycodone and some anxiety on Paxil and lorazepam.  She also takes low-dose aspirin.  She describes shortness of breath with exertion, which is worse walking up hills or doing certain activities with her horses.  She has been getting more discomfort which feels like a tightness or squeezing in her chest.  Sometimes this is at rest and other times that exertion.  10/03/2019  Holly Best returns today for follow-up of shortness of breath and chest discomfort.  He underwent echocardiography as well as a CT coronary angiogram which I personally reviewed.  The echo showed normal systolic function with mild diastolic dysfunction.  There is likely some very mild aortic stenosis with a mean gradient of 11 mmHg as well as some mild aortic insufficiency.  She had mild left atrial enlargement with trivial mitral vegetation.  Her right atrial pressure was elevated at 8 mmHg and unfortunate there was not  enough tricuspid regurgitation to measure pulmonary pressures however her pulmonary arteries were noted to be dilated on a CT suggestive of pulmonary hypertension.  The CT was reassuring and that there was minimal nonobstructive coronary disease however her calcium score was 65 which was 69th percentile for age and sex matched control.  This certainly does not explain her dyspnea however is target for cardiovascular risk reduction.  Subsequently since she was not tolerant of statins, we have been able to get prior authorization for Repatha and she is already given herself 1 injection.  We will need to reassess her lipid profile in a few months.  03/30/2020  Holly Best is seen today in follow-up with her husband.  After extensive work-up was suggested that she might have some pulmonary hypertension.  I started her on low-dose calcium channel blocker and she does report some improvement in her shortness of breath.  Blood pressure is good today 132/78.  I also started her on Repatha.  This is led to a marked improvement in her dyslipidemia.  Total cholesterol now 127, HDL 72, triglycerides 54 and LDL of 44.  03/28/2021  Holly Best returns today for follow-up.  Overall she seems to be doing well.  She was found by echo to have some mild aortic insufficiency after noting a heart murmur in 2021.  She also had marked dyslipidemia and was found to have mild nonobstructive coronary disease with some coronary calcium by CT.  She had been started on Repatha with  a significant improvement in her lipids and LDL down to 44.  She did have recent repeat lipid testing which showed a total cholesterol 138, triglycerides 96, HDL 67 and LDL 53.  She seems to be tolerating the medicine well.  She denies any chest pain or worsening shortness of breath.  03/18/2023  Holly Best is seen today in follow-up.  Overall she seems to be doing well.  She had lipids done about 6 months ago.  Total cholesterol 142, triglycerides  75, HDL 90 and LDL 37.  She is on Repatha.  She has no significant side effects with this.  She has not previously had an LP(a).  She denies any worsening shortness of breath.  Her last echo in 2023 showed some mild AI.  No significant aortic stenosis.  Blood pressure is normal.  PMHx:  Past Medical History:  Diagnosis Date   Cancer (HCC)    right breast cancer   Crohn disease (HCC)    back in 1991, Remicaid in 2002 and no longer has problems with this   Depression    Family history of breast cancer    History of radiation therapy 09/10/16- 10/08/16   Right Breast 2.67 Gy in 15 fractions, Right Breast boost, 2 Gy in 5 fractions.    Hypercholesteremia    Personal history of radiation therapy     Past Surgical History:  Procedure Laterality Date   BREAST LUMPECTOMY Right 2018   BREAST LUMPECTOMY WITH RADIOACTIVE SEED AND SENTINEL LYMPH NODE BIOPSY Right 08/06/2016   Procedure: BREAST LUMPECTOMY WITH RADIOACTIVE SEED AND SENTINEL LYMPH NODE BIOPSY;  Surgeon: Griselda Miner, MD;  Location: [facility] OR;  Service: General;  Laterality: Right;   COLONOSCOPY     dental implants     upper   LUMBAR LAMINECTOMY Bilateral 03/23/2013   Procedure: MICRO LUMBAR DECOMPRESSION, FORAMINOTOMY L4-5;  Surgeon: Javier Docker, MD;  Location: [facility];  Service: Orthopedics;  Laterality: Bilateral;   POSTERIOR LUMBAR FUSION  10/16/2017    Posterior Lumbar Interbody Fusion - Lumbar Two-Lumbar Three -     FAMHx:  Family History  Problem Relation Age of Onset   Breast cancer Sister 59   Breast cancer Other 50   Lung cancer Mother    Lung cancer Father    Breast cancer Maternal Grandmother        dx in her 39s   Other Brother        non-malignant spinal tumor   Breast cancer Maternal Aunt        dx > 50   Lupus Maternal Aunt    Breast cancer Maternal Aunt        dx >50    SOCHx:   reports that she quit smoking about 45 years ago. Her smoking use included cigarettes. She started smoking about 55 years ago.  She has a 10 pack-year smoking history. She has never used smokeless tobacco. She reports current alcohol use. She reports that she does not use drugs.  ALLERGIES:  No Known Allergies  ROS: Pertinent items noted in HPI and remainder of comprehensive ROS otherwise negative.  HOME MEDS: Current Outpatient Medications on File Prior to Visit  Medication Sig Dispense Refill   calcium carbonate (TUMS - DOSED IN MG ELEMENTAL CALCIUM) 500 MG chewable tablet Chew 500 mg by mouth at bedtime.      Evolocumab (REPATHA SURECLICK) 140 MG/ML SOAJ Inject 140 mg into the skin every 14 (fourteen) days. 2 mL 11   LORazepam (ATIVAN) 1  MG tablet Take 1 mg by mouth at bedtime.     naloxone (NARCAN) 4 MG/0.1ML LIQD nasal spray kit Narcan 4 mg/actuation nasal spray  USE 1 SPRAY IN ONE NOSTRIL. REPEAT AFTER 3 MINUTES IF NO OR MINIMAL RESPONSE. CALL 911 AFTER DOSE IS GIVEN.     oxyCODONE-acetaminophen (PERCOCET/ROXICET) 5-325 MG tablet Take 1 tablet by mouth every 6 (six) hours as needed for severe pain.     PARoxetine (PAXIL) 20 MG tablet Take 20 mg by mouth every evening.     No current facility-administered medications on file prior to visit.    LABS/IMAGING: No results found for this or any previous visit (from the past 48 hours). No results found.  LIPID PANEL:    Component Value Date/Time   CHOL 142 09/16/2022 0936   TRIG 75 09/16/2022 0936   HDL 90 09/16/2022 0936   CHOLHDL 1.6 09/16/2022 0936   LDLCALC 37 09/16/2022 0936    WEIGHTS: Wt Readings from Last 3 Encounters:  03/18/23 155 lb 11.2 oz (70.6 kg)  09/17/22 147 lb 9.6 oz (67 kg)  10/01/21 148 lb 14.4 oz (67.5 kg)    VITALS: BP 122/68   Pulse 75   Ht 5\' 6"  (1.676 m)   Wt 155 lb 11.2 oz (70.6 kg)   SpO2 96%   BMI 25.13 kg/m   EXAM: General appearance: alert and no distress Neck: no carotid bruit, no JVD, and thyroid not enlarged, symmetric, no tenderness/mass/nodules Lungs: clear to auscultation bilaterally Heart: regular  rate and rhythm, no S3 or S4, and systolic murmur: systolic ejection 3/6, crescendo at 2nd right intercostal space Abdomen: soft, non-tender; bowel sounds normal; no masses,  no organomegaly Extremities: extremities normal, atraumatic, no cyanosis or edema Pulses: 2+ and symmetric Skin: Skin color, texture, turgor normal. No rashes or lesions Neurologic: Grossly normal Psych: Pleasant  EKG: EKG Interpretation Date/Time:  Wednesday March 18 2023 13:33:01 EDT Ventricular Rate:  71 PR Interval:  144 QRS Duration:  74 QT Interval:  418 QTC Calculation: 454 R Axis:   32  Text Interpretation: Sinus rhythm with occasional Premature ventricular complexes When compared with ECG of 17-Sep-2022 13:45, Premature ventricular complexes are now Present Confirmed by Zoila Shutter [id number]) on 03/18/2023 1:46:24 PM    ASSESSMENT: Pulmonary hypertension Very mild aortic stenosis with mild aortic insufficiency, normal LVEF (08/2019) 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   Mrs. Scantling continues to do well symptomatically from a cardiac standpoint.  Her main issues have to do with arthritis and back pain.  She did have some mild nonobstructive coronary disease with some coronary calcium but her lipids are now much better controlled.  She has a history of AI with diastolic murmur but no worsening shortness of breath.  Blood pressure is normal not on therapy.  Will check an LP(a) to see if she might qualify for trial.  Plan otherwise follow-up annually with repeat lipids or sooner as necessary.  Chrystie Nose, MD, [facility], FACP  [place]  [facility]  Medical Director of the [facility] Lipid [facility]  Cardiovascular Risk Reduction Clinic Diplomate of  the American Board of Clinical Lipidology Attending Cardiologist  Direct Dial: [phone]  Fax: [phone]  Website:  [email or web address] Nicely Tyneka Scafidi 03/18/2023, 1:46 PM

## 2023-03-18 NOTE — Patient Instructions (Signed)
 Medication Instructions:  NO CHANGES  *If you need a refill on your cardiac medications before your next appointment, please call your pharmacy*   Lab Work: LPa today   FASTING lab work tin 12 months  If you have labs (blood work) drawn today and your tests are completely normal, you will receive your results only by: MyChart Message (if you have MyChart) OR A paper copy in the mail If you have any lab test that is abnormal or we need to change your treatment, we will call you to review the results.   Follow-Up: At [facility], you and your health needs are our priority.  As part of our continuing mission to provide you with exceptional heart care, we have created designated Provider Care Teams.  These Care Teams include your primary Cardiologist (physician) and Advanced Practice Providers (APPs -  Physician Assistants and Nurse Practitioners) who all work together to provide you with the care you need, when you need it.  We recommend signing up for the patient portal called "MyChart".  Sign up information is provided on this After Visit Summary.  MyChart is used to connect with patients for Virtual Visits (Telemedicine).  Patients are able to view lab/test results, encounter notes, upcoming appointments, etc.  Non-urgent messages can be sent to your provider as well.   To learn more about what you can do with MyChart, go to [email or web address].    Your next appointment:    12 months with Dr. Rennis Golden Other Instructions    1st Floor: - Lobby - Registration  - Pharmacy  - Lab - Cafe  2nd Floor: - PV Lab - Diagnostic Testing (echo, CT, nuclear med)  3rd Floor: - Vacant  4th Floor: - TCTS (cardiothoracic surgery) - AFib Clinic - Structural Heart Clinic - Vascular Surgery  - Vascular Ultrasound  5th Floor: - HeartCare Cardiology (general and EP) - Clinical Pharmacy for coumadin, hypertension, lipid, weight-loss medications, and med management  appointments    Valet parking services will be available as well.

## 2023-05-04 ENCOUNTER — Ambulatory Visit
Admission: RE | Admit: 2023-05-04 | Discharge: 2023-05-04 | Disposition: A | Discharge status: Home / Self Care | Payer: Federal, State, Local not specified - PPO | Source: Ambulatory Visit | Attending: Hematology and Oncology

## 2023-05-04 ENCOUNTER — Ambulatory Visit
Admission: RE | Admit: 2023-05-04 | Discharge: 2023-05-04 | Disposition: A | Discharge status: Home / Self Care | Payer: Federal, State, Local not specified - PPO | Source: Ambulatory Visit | Attending: Hematology and Oncology | Admitting: Hematology and Oncology

## 2023-05-04 DIAGNOSIS — Z170 Estrogen receptor positive status [ER+]: Secondary | ICD-10-CM

## 2023-05-04 DIAGNOSIS — R921 Mammographic calcification found on diagnostic imaging of breast: Secondary | ICD-10-CM

## 2023-05-21 ENCOUNTER — Ambulatory Visit: Payer: Self-pay | Admitting: *Deleted

## 2023-08-13 ENCOUNTER — Telehealth: Payer: Self-pay | Admitting: Pharmacy Technician

## 2023-08-13 NOTE — Telephone Encounter (Signed)
 Pharmacy Patient Advocate Encounter  Received notification from [facility] that Prior Authorization for REPATHA  has been APPROVED from 08/13/23 to 08/12/24

## 2023-08-13 NOTE — Telephone Encounter (Signed)
   Pharmacy Patient Advocate Encounter   Received notification from CoverMyMeds that prior authorization for repatha  is required/requested.   Insurance verification completed.   The patient is insured through [facility] .   Per test claim: PA required; PA submitted to above mentioned insurance via latent Key/confirmation #/[facility] Status is pending

## 2023-09-15 ENCOUNTER — Other Ambulatory Visit (HOSPITAL_BASED_OUTPATIENT_CLINIC_OR_DEPARTMENT_OTHER): Payer: Self-pay | Admitting: Internal Medicine

## 2023-10-05 ENCOUNTER — Telehealth: Payer: Self-pay

## 2023-10-05 NOTE — Telephone Encounter (Signed)
 Spoke with patient and confirmed appointment on 9/30

## 2023-10-06 ENCOUNTER — Inpatient Hospital Stay: Payer: Federal, State, Local not specified - PPO | Admitting: Hematology and Oncology

## 2023-10-08 ENCOUNTER — Inpatient Hospital Stay: Attending: Hematology and Oncology | Admitting: Hematology and Oncology

## 2023-10-08 VITALS — BP 125/60 | HR 77 | Temp 97.7°F | Resp 16 | Wt 146.3 lb

## 2023-10-08 DIAGNOSIS — M85832 Other specified disorders of bone density and structure, left forearm: Secondary | ICD-10-CM | POA: Diagnosis not present

## 2023-10-08 DIAGNOSIS — Z79810 Long term (current) use of selective estrogen receptor modulators (SERMs): Secondary | ICD-10-CM | POA: Insufficient documentation

## 2023-10-08 DIAGNOSIS — C50411 Malignant neoplasm of upper-outer quadrant of right female breast: Secondary | ICD-10-CM | POA: Diagnosis not present

## 2023-10-08 DIAGNOSIS — Z170 Estrogen receptor positive status [ER+]: Secondary | ICD-10-CM | POA: Diagnosis not present

## 2023-10-08 NOTE — Progress Notes (Signed)
 [facility]  [phone] Fax:([phone]) [phone]     ID: [place] DOB: 1950-08-28  MR#: [id number]  RDW#:[id number]  Patient Care Team: [place] [facility] ORN, MD as PCP - General (Cardiology) Holly Vinie JAYSON, MD as PCP - Cardiology (Cardiology) [facility] MOULD, MD as Consulting Physician (General Surgery) Holly Domino, MD as Attending Physician (Radiation Oncology) Holly Purchase, MD as Consulting Physician (Orthopedic Surgery) Holly Baxter, MD as Referring Physician (Dermatology) Holly Ade, MD as Consulting Physician (Physical Medicine and Rehabilitation) Holly Sharper, MD as Referring Physician (Internal Medicine) Holly Alm Hamilton, MD as Consulting Physician (Neurosurgery) Holly Rigg, MD as Consulting Physician (Dermatology)  CHIEF COMPLAINT: Estrogen receptor positive breast cancer  CURRENT TREATMENT:   Observation.  INTERVAL HISTORY:  Discussed the use of AI scribe software for clinical note transcription with the patient, who gave verbal consent to proceed.  History of Present Illness [facility] is a 73 year old female who presents for follow-up on breast calcifications and bone density results.  She had a mammogram in April which showed calcifications in the right breast. She experiences persistent soreness in her breast but has not noticed any new changes. No breathing difficulties, bowel movement issues, or trouble urinating.  She underwent a bone density test on the same day as her mammogram.   Her current medications include Repatha , which she is taking as prescribed. No blood in her stool, black stool, or recent falls.  Rest of pertinent 10 point ROS reviewed and neg.    COVID 19 VACCINATION STATUS: Status post Moderna times 2 plus booster in November.    BREAST CANCER HISTORY:  From the original intake note:  [place] [facility] had screening mammography July 2018 showing breast density category C, and a possible mass in  the upper-outer quadrant of the right breast and calcifications in the lower inner quadrant of the left breast. On 07/17/2016 she underwent bilateral diagnostic mammography with ultrasonography and right breast ultrasonography at [facility]. Breast density was category C. In the left breast, a group of coarse calcifications in the lower inner quadrant measuring 1.8 cm were felt to be benign.  In the right breast there was a spiculated mass in the upper-outer quadrant which was palpable as a 2.5 cm area of thickening at the 11:00 position of the right breast. There was no palpable right axillary adenopathy. Ultrasonography of the right breast mass confirmed a 0.8 cm hypoechoic and irregular mass at the 10:30-11:00 position 6 cm from the nipple. Ultrasound of the right axilla was sonographically benign.  Biopsy of the right breast mass in question 07/22/2016 showed ([id number]) and invasive ductal carcinoma, grade 1, estrogen receptor 100% positive, progesterone receptor 90% positive, both with strong staining intensity, with an MIB-1 of 5%, and HER-2 not amplified with a signals ratio of 1.15 and the number per cell 2.25.  The patient's subsequent history is as detailed below.   PAST MEDICAL HISTORY: Past Medical History:  Diagnosis Date   Cancer [place])    right breast cancer   Crohn disease (HCC)    back in 1991, Remicaid in 2002 and no longer has problems with this   Depression    Family history of breast cancer    History of radiation therapy 09/10/16- 10/08/16   Right Breast 2.67 Gy in 15 fractions, Right Breast boost, 2 Gy in 5 fractions.    Hypercholesteremia    Personal history of radiation therapy     PAST SURGICAL HISTORY: Past Surgical History:  Procedure Laterality Date   BREAST LUMPECTOMY Right 2018   BREAST LUMPECTOMY WITH RADIOACTIVE SEED AND SENTINEL LYMPH NODE BIOPSY Right 08/06/2016   Procedure: BREAST LUMPECTOMY WITH RADIOACTIVE SEED AND SENTINEL LYMPH NODE BIOPSY;   Surgeon: [facility] MOULD, MD;  Location: [facility] OR;  Service: General;  Laterality: Right;   COLONOSCOPY     dental implants     upper   LUMBAR LAMINECTOMY Bilateral 03/23/2013   Procedure: MICRO LUMBAR DECOMPRESSION, FORAMINOTOMY L4-5;  Surgeon: [facility] Billing, MD;  Location: [facility];  Service: Orthopedics;  Laterality: Bilateral;   POSTERIOR LUMBAR FUSION  10/16/2017    Posterior Lumbar Interbody Fusion - Lumbar Two-Lumbar Three -     FAMILY HISTORY Family History  Problem Relation Age of Onset   Breast cancer Sister 62   Breast cancer Other 34   Lung cancer Mother    Lung cancer Father    Breast cancer Maternal Grandmother        dx in her 20s   Other Brother        non-malignant spinal tumor   Breast cancer Maternal Aunt        dx > 50   Lupus Maternal Aunt    Breast cancer Maternal Aunt        dx >50  The patient's father died from lung cancer at age 8. The patient's mother also died from lung cancer at age 74. The patient has 3 brothers, one sister. The patient's sister was diagnosed with breast cancer at age 27. The patient had a niece with breast cancer (not her sister's daughter) at age 69.    GYNECOLOGIC HISTORY:  No LMP recorded. Patient is postmenopausal. Menarche age 49, first live birth age 5 the patient is GX P1. She went through the change of life approximately age 51. She did not take hormone replacement. She never used oral contraceptives.   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 Her husband Holly ([name]) worked in Designer, fashion/clothing. Their son lives in [place] and works for [place]. The patient has 2 grandchildren. They attend a nondenominational church in [place] .     ADVANCED DIRECTIVES: In place   HEALTH MAINTENANCE: Social History   Tobacco Use   Smoking status: Former    Current packs/day: 0.00    Average packs/day: 1 pack/day for 10.0 years (10.0 ttl pk-yrs)    Types: Cigarettes    Start date:  02/18/1968    Quit date: 02/17/1978    Years since quitting: 45.6   Smokeless tobacco: Never  Vaping Use   Vaping status: Never Used  Substance Use Topics   Alcohol use: Yes    Comment: occassionally, on weekends   Drug use: No     Colonoscopy: 2018  PAP:  Bone density:   No Known Allergies  Current Outpatient Medications  Medication Sig Dispense Refill   calcium carbonate (TUMS - DOSED IN MG ELEMENTAL CALCIUM) 500 MG chewable tablet Chew 500 mg by mouth at bedtime.      LORazepam  (ATIVAN ) 1 MG tablet Take 1 mg by mouth at bedtime.     naloxone (NARCAN) 4 MG/0.1ML LIQD nasal spray kit Narcan 4 mg/actuation nasal spray  USE 1 SPRAY IN ONE NOSTRIL. REPEAT AFTER 3 MINUTES IF NO OR MINIMAL RESPONSE. CALL 911 AFTER DOSE IS GIVEN.     oxyCODONE -acetaminophen  (PERCOCET/ROXICET) 5-325 MG tablet Take 1 tablet by mouth every 6 (six) hours as needed for severe pain.  PARoxetine  (PAXIL ) 20 MG tablet Take 20 mg by mouth every evening.     REPATHA  SURECLICK 140 MG/ML SOAJ INJECT 140 MG UNDER THE SKIN EVERY 14 DAYS 2 mL 11   No current facility-administered medications for this visit.    OBJECTIVE:   Vitals:   10/08/23 1408  BP: 125/60  Pulse: 77  Resp: 16  Temp: 97.7 F (36.5 C)  SpO2: 90%     Body mass index is 23.61 kg/m.   Wt Readings from Last 3 Encounters:  10/08/23 146 lb 4.8 oz (66.4 kg)  03/18/23 155 lb 11.2 oz (70.6 kg)  09/17/22 147 lb 9.6 oz (67 kg)   ECOG FS:2 - Symptomatic, <50% confined to bed   She appears well, alert, oriented and in no acute distress Bilateral breasts inspected and palpated. No palpable masses. Post treatment changes. No regional adenopathy. CTA bilaterally RRR No LE edema.  LAB RESULTS:  CMP     Component Value Date/Time   NA 143 10/21/2019 1137   NA 140 08/30/2019 1324   NA 142 12/08/2016 0942   K 4.0 10/21/2019 1137   K 3.8 12/08/2016 0942   CL 106 10/21/2019 1137   CO2 31 10/21/2019 1137   CO2 27 12/08/2016 0942    GLUCOSE 89 10/21/2019 1137   GLUCOSE 83 12/08/2016 0942   BUN 22 10/21/2019 1137   BUN 16 08/30/2019 1324   BUN 14.3 12/08/2016 0942   CREATININE 0.87 10/21/2019 1137   CREATININE 0.82 07/20/2017 1056   CREATININE 0.9 12/08/2016 0942   CALCIUM 9.4 10/21/2019 1137   CALCIUM 9.9 12/08/2016 0942   PROT 6.6 10/21/2019 1137   PROT 7.3 12/08/2016 0942   ALBUMIN 3.8 10/21/2019 1137   ALBUMIN 4.3 12/08/2016 0942   AST 13 (L) 10/21/2019 1137   AST 17 07/20/2017 1056   AST 18 12/08/2016 0942   ALT 8 10/21/2019 1137   ALT 15 07/20/2017 1056   ALT 16 12/08/2016 0942   ALKPHOS 110 10/21/2019 1137   ALKPHOS 82 12/08/2016 0942   BILITOT 0.9 10/21/2019 1137   BILITOT 0.9 07/20/2017 1056   BILITOT 1.02 12/08/2016 0942   GFRNONAA >60 10/21/2019 1137   GFRNONAA >60 07/20/2017 1056   GFRAA 94 08/30/2019 1324   GFRAA >60 07/20/2017 1056    No results found for: TOTALPROTELP, ALBUMINELP, A1GS, A2GS, BETS, BETA2SER, GAMS, MSPIKE, SPEI  No results found for: KPAFRELGTCHN, LAMBDASER, KAPLAMBRATIO  Lab Results  Component Value Date   WBC 7.2 10/21/2019   NEUTROABS 4.8 10/21/2019   HGB 13.1 10/21/2019   HCT 39.8 10/21/2019   MCV 101.0 (H) 10/21/2019   PLT 299 10/21/2019      Chemistry      Component Value Date/Time   NA 143 10/21/2019 1137   NA 140 08/30/2019 1324   NA 142 12/08/2016 0942   K 4.0 10/21/2019 1137   K 3.8 12/08/2016 0942   CL 106 10/21/2019 1137   CO2 31 10/21/2019 1137   CO2 27 12/08/2016 0942   BUN 22 10/21/2019 1137   BUN 16 08/30/2019 1324   BUN 14.3 12/08/2016 0942   CREATININE 0.87 10/21/2019 1137   CREATININE 0.82 07/20/2017 1056   CREATININE 0.9 12/08/2016 0942      Component Value Date/Time   CALCIUM 9.4 10/21/2019 1137   CALCIUM 9.9 12/08/2016 0942   ALKPHOS 110 10/21/2019 1137   ALKPHOS 82 12/08/2016 0942   AST 13 (L) 10/21/2019 1137   AST 17 07/20/2017 1056   AST 18  12/08/2016 0942   ALT 8 10/21/2019 1137   ALT 15  07/20/2017 1056   ALT 16 12/08/2016 0942   BILITOT 0.9 10/21/2019 1137   BILITOT 0.9 07/20/2017 1056   BILITOT 1.02 12/08/2016 0942       No results found for: LABCA2  No components found for: [id number]  No results for input(s): INR in the last 168 hours.  Urinalysis No results found for: COLORURINE, APPEARANCEUR, LABSPEC, PHURINE, GLUCOSEU, HGBUR, BILIRUBINUR, KETONESUR, PROTEINUR, UROBILINOGEN, NITRITE, LEUKOCYTESUR   STUDIES: No results found.   ELIGIBLE FOR AVAILABLE RESEARCH PROTOCOL: no   ASSESSMENT: 73 y.o. [place], [place] woman status post right breast upper outer quadrant biopsy 07/22/2016 for a clinical T1b N0, stage IA invasive ductal carcinoma, grade 1, estrogen and progesterone receptor positive, HER-2 nonamplified, with an MIB-1 of 5%.  (1) Right lumpectomy 08/06/2016 showed a pT1b pN0, stage IA invasive ductal carcinoma, grade 1, with negative margins.    (2) Genetics testing on 08/19/2016 revealed no deleterious mutations APC, ATM, AXIN2, BARD1, BMPR1A, BRCA1, BRCA2, BRIP1, CDH1, CDKN2A (p14ARF), CDKN2A (p16INK4a), CHEK2, CTNNA1, DICER1, EPCAM (Deletion/duplication testing only), GREM1 (promoter region deletion/duplication testing only), KIT, MEN1, MLH1, MSH2, MSH3, MSH6, MUTYH, NBN, NF1, NHTL1, PALB2, PDGFRA, PMS2, POLD1, POLE, PTEN, RAD50, RAD51C, RAD51D, SDHB, SDHC, SDHD, SMAD4, SMARCA4. STK11, TP53, TSC1, TSC2, and VHL.  The following genes were evaluated for sequence changes only: SDHA and HOXB13 c.251G>A variant only.   (3) Oncotype DX: Score of 16 predicts a 10-year risk of recurrence outside the breast of 10% of the patient's only systemic therapy is tamoxifen  for 5 years.  It also predicts no benefit from chemotherapy.  (4) adjuvant radiation 09/10/2016-10/08/2016  Site/dose:   1. Right breast, 2.67 Gy in 15 fractions                         2. Boost, 2 Gy in 5 fractions  (5) Anastrozole  started 11/2016, held October 2021 with multiple  symptoms  (a) bone density 11/10/2016 showed a T score of -1.0 (normal)  (6) tamoxifen  started 01/17/2019 to present   PLAN:  Assessment and Plan Assessment & Plan Right breast calcifications Calcifications monitored with previous mammogram recommending follow-up in one year. - Order diagnostic mammogram for [facility] at the [facility]. - No concerns on exam  Osteopenia, left forearm Bone density slightly below normal in left forearm, mild for age. - Encourage walking and weight-bearing exercises. - Recommend vitamin D  and calcium supplements unless dietary intake is sufficient.  General Health Maintenance Regular follow-up with primary care physician important for overall health maintenance. - Schedule annual follow-up with primary care physician. - Continue regular mammograms as scheduled.  Time spent: 20 min  *Total Encounter Time as defined by the Centers for Medicare and Medicaid Services includes, in addition to the face-to-face time of a patient visit (documented in the note above) non-face-to-face time: obtaining and reviewing outside history, ordering and reviewing medications, tests or procedures, care coordination (communications with other health care professionals or caregivers) and documentation in the medical record.

## 2024-05-04 ENCOUNTER — Encounter

## 2024-10-07 ENCOUNTER — Ambulatory Visit: Admitting: Hematology and Oncology
